# Patient Record
Sex: Female | Born: 1945 | Race: White | Marital: Married | Smoking: Never smoker
Health system: Southern US, Community
[De-identification: ages and names within clinical notes are randomized; demographics above are authoritative.]

## PROBLEM LIST (undated history)

## (undated) DIAGNOSIS — E119 Type 2 diabetes mellitus without complications: Secondary | ICD-10-CM

## (undated) DIAGNOSIS — I10 Essential (primary) hypertension: Secondary | ICD-10-CM

## (undated) DIAGNOSIS — N184 Chronic kidney disease, stage 4 (severe): Secondary | ICD-10-CM

## (undated) DIAGNOSIS — T8859XA Other complications of anesthesia, initial encounter: Secondary | ICD-10-CM

## (undated) DIAGNOSIS — Z940 Kidney transplant status: Secondary | ICD-10-CM

---

## 2019-09-16 ENCOUNTER — Other Ambulatory Visit: Payer: Self-pay | Admitting: Dentistry

## 2019-09-16 DIAGNOSIS — S0085XA Superficial foreign body of other part of head, initial encounter: Secondary | ICD-10-CM

## 2019-09-16 DIAGNOSIS — M26633 Articular disc disorder of bilateral temporomandibular joint: Secondary | ICD-10-CM

## 2019-09-18 ENCOUNTER — Ambulatory Visit
Admission: RE | Admit: 2019-09-18 | Discharge: 2019-09-18 | Disposition: A | Discharge status: Home / Self Care | Payer: Medicare Other | Source: Ambulatory Visit | Attending: Dentistry | Admitting: Dentistry

## 2019-09-18 ENCOUNTER — Other Ambulatory Visit: Payer: Self-pay

## 2019-09-18 DIAGNOSIS — M26633 Articular disc disorder of bilateral temporomandibular joint: Secondary | ICD-10-CM

## 2019-12-21 DIAGNOSIS — Z7189 Other specified counseling: Secondary | ICD-10-CM

## 2019-12-29 DIAGNOSIS — D649 Anemia, unspecified: Secondary | ICD-10-CM | POA: Diagnosis not present

## 2020-02-21 ENCOUNTER — Encounter: Payer: Self-pay | Admitting: Pharmacist

## 2020-02-21 DIAGNOSIS — D631 Anemia in chronic kidney disease: Secondary | ICD-10-CM | POA: Insufficient documentation

## 2020-02-29 ENCOUNTER — Other Ambulatory Visit: Payer: Self-pay | Admitting: Hematology and Oncology

## 2020-02-29 ENCOUNTER — Inpatient Hospital Stay: Payer: Medicare Other

## 2020-02-29 LAB — CBC AND DIFFERENTIAL
HCT: 32 — AB (ref 36–46)
Hemoglobin: 10.6 — AB (ref 12.0–16.0)
Platelets: 185 (ref 150–399)
WBC: 4

## 2020-02-29 LAB — CBC: RBC: 3.6 — AB (ref 3.87–5.11)

## 2020-03-22 ENCOUNTER — Other Ambulatory Visit: Payer: Self-pay | Admitting: Hematology and Oncology

## 2020-03-22 DIAGNOSIS — D631 Anemia in chronic kidney disease: Secondary | ICD-10-CM

## 2020-03-22 DIAGNOSIS — N189 Chronic kidney disease, unspecified: Secondary | ICD-10-CM

## 2020-03-30 ENCOUNTER — Other Ambulatory Visit: Payer: Self-pay | Admitting: Oncology

## 2020-03-30 DIAGNOSIS — D631 Anemia in chronic kidney disease: Secondary | ICD-10-CM

## 2020-03-30 NOTE — Progress Notes (Deleted)
[facility]  [address] [place],  [place]  [address] [phone]  Clinic Day:  03/30/2020  Referring physician: Algis Greenhouse, MD   HISTORY OF PRESENT ILLNESS:  The patient is a 74 y.o. female with anemia secondary to chronic renal insufficiency.  Over these past few months, she has been receiving Retacrit, which has been effective in getting her hemoglobin to/above 10.  She comes in today for routine follow-up.  Since her last visit, the patient has been doing well.  She denies having increased fatigue or any overt forms of blood loss which concern her for progressive anemia.      PHYSICAL EXAM:  There were no vitals taken for this visit. Wt Readings from Last 3 Encounters:  No data found for Wt   There is no height or weight on file to calculate BMI. Performance status (ECOG): {CHL ONC [id number] Physical Exam Constitutional:      Appearance: Normal appearance. She is not ill-appearing.  HENT:     Mouth/Throat:     Mouth: Mucous membranes are moist.     Pharynx: Oropharynx is clear. No oropharyngeal exudate or posterior oropharyngeal erythema.  Cardiovascular:     Rate and Rhythm: Normal rate and regular rhythm.     Heart sounds: No murmur heard.  No friction rub. No gallop.   Pulmonary:     Effort: Pulmonary effort is normal. No respiratory distress.     Breath sounds: Normal breath sounds. No wheezing, rhonchi or rales.  Abdominal:     General: Bowel sounds are normal. There is no distension.     Palpations: Abdomen is soft. There is no mass.     Tenderness: There is no abdominal tenderness.  Musculoskeletal:        General: No swelling.     Right lower leg: No edema.     Left lower leg: No edema.  Lymphadenopathy:     Cervical: No cervical adenopathy.     Upper Body:     Right upper body: No supraclavicular or axillary adenopathy.     Left upper body: No supraclavicular or axillary adenopathy.     Lower Body: No right  inguinal adenopathy. No left inguinal adenopathy.  Skin:    General: Skin is warm.     Coloration: Skin is not jaundiced.     Findings: No lesion or rash.  Neurological:     General: No focal deficit present.     Mental Status: She is alert and oriented to person, place, and time. Mental status is at baseline.     Cranial Nerves: Cranial nerves are intact.  Psychiatric:        Mood and Affect: Mood normal.        Behavior: Behavior normal.        Thought Content: Thought content normal.     LABS:       CBC Latest Ref Rng & Units 02/29/2020  WBC - 4.0  Hemoglobin 12.0 - 16.0 10.6(A)  Hematocrit 36 - 46 32(A)  Platelets 150 - 399 185   No flowsheet data found.   No results found for: CEA1 / No results found for: CEA1 No results found for: PSA1 No results found for: GQQ761 No results found for: CAN125  No results found for: TOTALPROTELP, ALBUMINELP, A1GS, A2GS, BETS, BETA2SER, GAMS, MSPIKE, SPEI No results found for: TIBC, FERRITIN, IRONPCTSAT No results found for: LDH  No results found for: AFPTUMOR, TOTALPROTELP, ALBUMINELP, A1GS, A2GS, BETS, BETA2SER, GAMS,  MSPIKE, SPEI, LDH, CEA1, PSA1, IGASERUM, IGGSERUM, IGMSERUM, THGAB, THYROGLB   STUDIES:  No results found.    ASSESSMENT & PLAN:   Assessment/Plan:  A 74 y.o. female with anemia secondary to chronic renal insufficiency.    The patient understands all the plans discussed today and is in agreement with them.      Ajmal Kathan Macarthur Critchley, MD

## 2020-03-31 ENCOUNTER — Other Ambulatory Visit: Payer: Medicare Other

## 2020-03-31 ENCOUNTER — Ambulatory Visit: Payer: Medicare Other | Admitting: Oncology

## 2020-05-24 ENCOUNTER — Other Ambulatory Visit: Payer: Self-pay | Admitting: Hematology and Oncology

## 2020-05-24 DIAGNOSIS — N189 Chronic kidney disease, unspecified: Secondary | ICD-10-CM

## 2020-05-24 DIAGNOSIS — D631 Anemia in chronic kidney disease: Secondary | ICD-10-CM

## 2020-05-25 ENCOUNTER — Encounter: Payer: Self-pay | Admitting: Hematology and Oncology

## 2020-05-25 ENCOUNTER — Other Ambulatory Visit: Payer: Self-pay

## 2020-05-25 ENCOUNTER — Telehealth: Payer: Self-pay | Admitting: Hematology and Oncology

## 2020-05-25 ENCOUNTER — Inpatient Hospital Stay: Payer: Medicare Other | Admitting: Hematology and Oncology

## 2020-05-25 ENCOUNTER — Inpatient Hospital Stay: Payer: Medicare Other | Attending: Hematology and Oncology

## 2020-05-25 VITALS — BP 123/84 | HR 67 | Temp 98.2°F | Resp 16 | Ht 61.0 in | Wt 125.5 lb

## 2020-05-25 DIAGNOSIS — D631 Anemia in chronic kidney disease: Secondary | ICD-10-CM | POA: Diagnosis not present

## 2020-05-25 DIAGNOSIS — N289 Disorder of kidney and ureter, unspecified: Secondary | ICD-10-CM | POA: Insufficient documentation

## 2020-05-25 DIAGNOSIS — N189 Chronic kidney disease, unspecified: Secondary | ICD-10-CM | POA: Diagnosis not present

## 2020-05-25 LAB — CBC AND DIFFERENTIAL
HCT: 29 — AB (ref 36–46)
Hemoglobin: 9.6 — AB (ref 12.0–16.0)
Neutrophils Absolute: 2.16
Platelets: 166 (ref 150–399)
WBC: 4

## 2020-05-25 LAB — BASIC METABOLIC PANEL
BUN: 54 — AB (ref 4–21)
CO2: 21 (ref 13–22)
Chloride: 110 — AB (ref 99–108)
Creatinine: 3.3 — AB (ref 0.5–1.1)
Glucose: 149
Potassium: 4.6 (ref 3.4–5.3)
Sodium: 141 (ref 137–147)

## 2020-05-25 LAB — HEPATIC FUNCTION PANEL
ALT: 19 (ref 7–35)
AST: 27 (ref 13–35)
Alkaline Phosphatase: 132 — AB (ref 25–125)
Bilirubin, Total: 0.5

## 2020-05-25 LAB — CBC: RBC: 3.09 — AB (ref 3.87–5.11)

## 2020-05-25 LAB — COMPREHENSIVE METABOLIC PANEL
Albumin: 4.1 (ref 3.5–5.0)
Calcium: 9.2 (ref 8.7–10.7)

## 2020-05-25 NOTE — Addendum Note (Signed)
Addended by: Juanetta Beets on: 05/25/2020 01:42 PM   Modules accepted: Orders

## 2020-05-25 NOTE — Progress Notes (Signed)
[name]  [address] [place],  [place]  [address] [phone]  Clinic Day:  06/30/2020  Referring physician: Algis Greenhouse, MD   CHIEF COMPLAINT:  CC: A 75 year old female with a history of anemia who is here for 3 month evaluation  Current Treatment:  Retacrit   HISTORY OF PRESENT ILLNESS:  Ruth Riley is a 75 y.o. female with a history of anemia related to renal insufficiency. 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 She has been receiving monthly Retacrit injections for hemoglobin below 10.   INTERVAL HISTORY:  Analy is here today for routine follow up of her anemia. She has been doing well since last visit. She denies fever, chills, nausea or vomiting. She denies issue with bowel or bladder. She denies shortness of breath, chest pain or cough. Her appetite is good and her weight is stable. CBC is unremarkable today with a hemoglobin of 10.5. CMP reveals a creatinine of 3.2 with BUN 57. This is stable for the patient.  REVIEW OF SYSTEMS:  Review of Systems  Constitutional: Negative for appetite change, chills, diaphoresis, fatigue, fever and unexpected weight change.  HENT:   Negative for hearing loss, lump/mass, mouth sores, nosebleeds, sore throat, tinnitus, trouble swallowing and voice change.   Eyes: Negative for eye problems and icterus.  Respiratory: Negative for chest tightness, cough, hemoptysis, shortness of breath and wheezing.   Cardiovascular: Negative for chest pain, leg swelling and palpitations.  Gastrointestinal: Negative for abdominal distention, abdominal pain, blood in stool, constipation, diarrhea, nausea, rectal pain and vomiting.  Endocrine: Negative for hot flashes.  Genitourinary: Negative for bladder incontinence, difficulty urinating, dyspareunia, dysuria, frequency, hematuria and nocturia.    Musculoskeletal: Negative for arthralgias, back pain, flank pain, gait problem, myalgias, neck pain and neck stiffness.  Skin: Negative for itching, rash and wound.  Neurological: Negative for dizziness, extremity weakness, gait problem, headaches, light-headedness, numbness, seizures and speech difficulty.  Hematological: Negative for adenopathy. Does not bruise/bleed easily.  Psychiatric/Behavioral: Negative for confusion, decreased concentration, depression, sleep disturbance and suicidal ideas. The patient is not nervous/anxious.      VITALS:  Blood pressure 123/84, pulse 67, temperature 98.2 F (36.8 C), temperature source Oral, resp. rate 16, height 5\' 1"  (1.549 m), weight 125 lb 8 oz (56.9 kg), SpO2 96 %.  Wt Readings from Last 3 Encounters:  05/26/20 125 lb 4 oz (56.8 kg)  05/25/20 125 lb 8 oz (56.9 kg)    Body mass index is 23.71 kg/m.  Performance status (ECOG): 1 - Symptomatic but completely ambulatory  PHYSICAL EXAM:  Physical Exam Constitutional:      General: She is not in acute distress.    Appearance: Normal appearance. She is normal weight. She is not ill-appearing, toxic-appearing or diaphoretic.  HENT:     Head: Normocephalic and atraumatic.     Nose: Nose normal. No congestion or rhinorrhea.     Mouth/Throat:     Mouth: Mucous membranes are moist.     Pharynx: Oropharynx is clear. No oropharyngeal exudate or posterior oropharyngeal erythema.  Eyes:     General: No scleral icterus.       Right eye: No discharge.        Left eye: No discharge.     Extraocular Movements: Extraocular movements intact.     Conjunctiva/sclera: Conjunctivae normal.     Pupils: Pupils are  equal, round, and reactive to light.  Neck:     Vascular: No carotid bruit.  Cardiovascular:     Rate and Rhythm: Normal rate and regular rhythm.     Heart sounds: No murmur heard. No friction rub. No gallop.   Pulmonary:     Effort: Pulmonary effort is normal. No respiratory distress.      Breath sounds: Normal breath sounds. No stridor. No wheezing, rhonchi or rales.  Chest:     Chest wall: No tenderness.  Abdominal:     General: Abdomen is flat. Bowel sounds are normal. There is no distension.     Palpations: There is no mass.     Tenderness: There is no abdominal tenderness. There is no right CVA tenderness, left CVA tenderness, guarding or rebound.     Hernia: No hernia is present.  Musculoskeletal:        General: No swelling, tenderness, deformity or signs of injury. Normal range of motion.     Cervical back: Normal range of motion and neck supple. No rigidity or tenderness.     Right lower leg: No edema.     Left lower leg: No edema.  Lymphadenopathy:     Cervical: No cervical adenopathy.  Skin:    General: Skin is warm and dry.     Capillary Refill: Capillary refill takes less than 2 seconds.     Coloration: Skin is not jaundiced or pale.     Findings: No bruising, erythema, lesion or rash.  Neurological:     General: No focal deficit present.     Mental Status: She is alert and oriented to person, place, and time. Mental status is at baseline.     Cranial Nerves: No cranial nerve deficit.     Sensory: No sensory deficit.     Motor: No weakness.     Coordination: Coordination normal.     Gait: Gait normal.     Deep Tendon Reflexes: Reflexes normal.  Psychiatric:        Mood and Affect: Mood normal.        Behavior: Behavior normal.        Thought Content: Thought content normal.        Judgment: Judgment normal.    Lymph nodes:   There is no cervical, clavicular, axillary or inguinal lymphadenopathy.   LABS:   CBC Latest Ref Rng & Units 06/22/2020 05/25/2020 02/29/2020  WBC - 3.3 4.0 4.0  Hemoglobin 12.0 - 16.0 10.5(A) 9.6(A) 10.6(A)  Hematocrit 36 - 46 31(A) 29(A) 32(A)  Platelets 150 - 399 129(A) 166 185   CMP Latest Ref Rng & Units 06/22/2020 05/25/2020  BUN 4 - 21 57(A) 54(A)  Creatinine 0.5 - 1.1 3.2(A) 3.3(A)  Sodium 137 - 147 133(A) 141   Potassium 3.4 - 5.3 4.0 4.6  Chloride 99 - 108 103 110(A)  CO2 13 - 22 20 21   Calcium 8.7 - 10.7 8.1(A) 9.2  Alkaline Phos 25 - 125 112 132(A)  AST 13 - 35 28 27  ALT 7 - 35 25 19     No results found for: CEA1 / No results found for: CEA1 No results found for: PSA1 No results found for: NUU725 No results found for: CAN125  No results found for: TOTALPROTELP, ALBUMINELP, A1GS, A2GS, BETS, BETA2SER, GAMS, MSPIKE, SPEI Lab Results  Component Value Date   TIBC 254 06/22/2020   FERRITIN 122 06/22/2020   IRONPCTSAT 6.2 06/22/2020   No results found for: LDH  STUDIES:  No results found.    HISTORY:  No past medical history on file.    No family history on file.  Social History:  reports that she has never smoked. She has never used smokeless tobacco. No history on file for alcohol use and drug use.The patient is alone  today.  Allergies:  Allergies  Allergen Reactions  . Penicillins Hives and Other (See Comments)  . Tramadol Other (See Comments) and Palpitations    Patient reports it makes her feel spaced out Patient reports it makes her feel spaced out Patient reports it makes her feel spaced out Patient reports it makes her feel spaced out   . Donepezil Diarrhea and Other (See Comments)    Current Medications: Current Outpatient Medications  Medication Sig Dispense Refill  . rosuvastatin (CRESTOR) 40 MG tablet Take by mouth.    . sertraline (ZOLOFT) 100 MG tablet Take by mouth.    . sodium bicarbonate 650 MG tablet Take 2 tablets by mouth 2 (two) times daily.    . traZODone (DESYREL) 50 MG tablet Take 50 mg by mouth at bedtime.    . carvedilol (COREG) 25 MG tablet Take 25 mg by mouth 2 (two) times daily.    . cyanocobalamin 1000 MCG tablet Take by mouth.    . Magnesium-Zinc (MAGNESIUM-CHELATED ZINC) 133.33-5 MG TABS Take by mouth.    . pantoprazole (PROTONIX) 40 MG tablet Take 40 mg by mouth 2 (two) times daily.    . predniSONE (DELTASONE) 5 MG tablet     .  rOPINIRole (REQUIP) 3 MG tablet Take 3 mg by mouth at bedtime.    . sulfamethoxazole-trimethoprim (BACTRIM) 400-80 MG tablet Take by mouth.    . tacrolimus (PROGRAF) 1 MG capsule Take by mouth.    . Vitamin D, Ergocalciferol, (DRISDOL) 1.25 MG (50000 UNIT) CAPS capsule Take 50,000 Units by mouth once a week.     No current facility-administered medications for this visit.     ASSESSMENT & PLAN:   Assessment:  Ruth Riley is a 75 y.o. female with anemia related to significant renal insufficiency. Hemoglobin today remains above 10.0.     Plan: We will hold Retacrit today. She will continue to check CBC monthly and will return to clinic in 3 months for CBC, CMP and evaluation. She will continue to follow with her nephrology team in regards to her renal insufficiency.  She verbalizes understanding of and agreement to the plans discussed today. She knows to call the office should any new questions or concerns arise.    Melodye Ped, NP

## 2020-05-25 NOTE — Telephone Encounter (Signed)
Per 1/12 los next appt given to patient 

## 2020-05-26 ENCOUNTER — Inpatient Hospital Stay: Payer: Medicare Other

## 2020-05-26 VITALS — BP 133/95 | HR 63 | Temp 98.5°F | Resp 18 | Ht 61.0 in | Wt 125.2 lb

## 2020-05-26 DIAGNOSIS — D631 Anemia in chronic kidney disease: Secondary | ICD-10-CM | POA: Diagnosis present

## 2020-05-26 DIAGNOSIS — N289 Disorder of kidney and ureter, unspecified: Secondary | ICD-10-CM | POA: Diagnosis not present

## 2020-05-26 DIAGNOSIS — N185 Chronic kidney disease, stage 5: Secondary | ICD-10-CM

## 2020-05-26 MED ORDER — EPOETIN ALFA-EPBX 40000 UNIT/ML IJ SOLN
40000.0000 [IU] | Freq: Once | INTRAMUSCULAR | Status: AC
  Administered 2020-05-26: 40000 [IU] via SUBCUTANEOUS

## 2020-05-26 MED ORDER — EPOETIN ALFA-EPBX 40000 UNIT/ML IJ SOLN
INTRAMUSCULAR | Status: AC
  Filled 2020-05-26: qty 1

## 2020-05-26 NOTE — Progress Notes (Signed)
1410:PT STABLE AT TIME OF DISCHARGE ?

## 2020-06-18 DIAGNOSIS — Z8616 Personal history of COVID-19: Secondary | ICD-10-CM | POA: Diagnosis present

## 2020-06-22 ENCOUNTER — Other Ambulatory Visit: Payer: Self-pay | Admitting: Hematology and Oncology

## 2020-06-22 ENCOUNTER — Inpatient Hospital Stay: Payer: Medicare Other | Attending: Oncology

## 2020-06-22 ENCOUNTER — Other Ambulatory Visit: Payer: Self-pay

## 2020-06-22 DIAGNOSIS — D631 Anemia in chronic kidney disease: Secondary | ICD-10-CM

## 2020-06-22 LAB — IRON,TIBC AND FERRITIN PANEL
%SAT: 6.2
Ferritin: 122
Iron: 16
TIBC: 254

## 2020-06-22 LAB — HEPATIC FUNCTION PANEL
ALT: 25 (ref 7–35)
AST: 28 (ref 13–35)
Alkaline Phosphatase: 112 (ref 25–125)
Bilirubin, Total: 0.6

## 2020-06-22 LAB — CBC AND DIFFERENTIAL
HCT: 31 — AB (ref 36–46)
Hemoglobin: 10.5 — AB (ref 12.0–16.0)
Neutrophils Absolute: 1.78
Platelets: 129 — AB (ref 150–399)
WBC: 3.3

## 2020-06-22 LAB — BASIC METABOLIC PANEL
BUN: 57 — AB (ref 4–21)
CO2: 20 (ref 13–22)
Chloride: 103 (ref 99–108)
Creatinine: 3.2 — AB (ref 0.5–1.1)
Glucose: 223
Potassium: 4 (ref 3.4–5.3)
Sodium: 133 — AB (ref 137–147)

## 2020-06-22 LAB — CBC: RBC: 3.46 — AB (ref 3.87–5.11)

## 2020-06-22 LAB — COMPREHENSIVE METABOLIC PANEL
Albumin: 3.7 (ref 3.5–5.0)
Calcium: 8.1 — AB (ref 8.7–10.7)

## 2020-06-29 ENCOUNTER — Other Ambulatory Visit: Payer: Self-pay | Admitting: Hematology and Oncology

## 2020-06-29 LAB — CBC: MCV: 90 (ref 81–99)

## 2020-07-20 ENCOUNTER — Inpatient Hospital Stay: Payer: Medicare Other | Attending: Oncology

## 2020-07-20 ENCOUNTER — Other Ambulatory Visit: Payer: Self-pay | Admitting: Hematology and Oncology

## 2020-07-20 ENCOUNTER — Other Ambulatory Visit: Payer: Self-pay

## 2020-07-20 DIAGNOSIS — D631 Anemia in chronic kidney disease: Secondary | ICD-10-CM

## 2020-07-20 DIAGNOSIS — N189 Chronic kidney disease, unspecified: Secondary | ICD-10-CM | POA: Insufficient documentation

## 2020-07-20 LAB — HEPATIC FUNCTION PANEL
ALT: 28 (ref 7–35)
AST: 29 (ref 13–35)
Alkaline Phosphatase: 131 — AB (ref 25–125)
Bilirubin, Total: 0.5

## 2020-07-20 LAB — BASIC METABOLIC PANEL
BUN: 41 — AB (ref 4–21)
CO2: 19 (ref 13–22)
Chloride: 110 — AB (ref 99–108)
Creatinine: 2.8 — AB (ref 0.5–1.1)
Glucose: 163
Potassium: 4.8 (ref 3.4–5.3)
Sodium: 138 (ref 137–147)

## 2020-07-20 LAB — COMPREHENSIVE METABOLIC PANEL
Albumin: 3.7 (ref 3.5–5.0)
Calcium: 8.8 (ref 8.7–10.7)

## 2020-07-20 LAB — CBC AND DIFFERENTIAL
HCT: 29 — AB (ref 36–46)
Hemoglobin: 9.5 — AB (ref 12.0–16.0)
Neutrophils Absolute: 2.51
Platelets: 170 (ref 150–399)
WBC: 4.4

## 2020-07-20 LAB — CBC: RBC: 3.16 — AB (ref 3.87–5.11)

## 2020-07-25 ENCOUNTER — Telehealth: Payer: Self-pay

## 2020-07-25 NOTE — Telephone Encounter (Addendum)
Pt's Hgb<10, Melissa states pt needs Retacrit injection. I sent message to Butch Penny to check for insurance authorization. I also sent scheduling message to schedulers to call pt, once approved for appt.  I attempted call to pt, no answer.   In basket message sent to [facility] P,NP.

## 2020-07-29 ENCOUNTER — Other Ambulatory Visit: Payer: Self-pay | Admitting: Oncology

## 2020-08-04 ENCOUNTER — Telehealth: Payer: Self-pay | Admitting: Oncology

## 2020-08-04 NOTE — Telephone Encounter (Signed)
08/04/20 Spoke with patient and scheduled injection

## 2020-08-08 ENCOUNTER — Inpatient Hospital Stay: Payer: Medicare Other

## 2020-08-08 ENCOUNTER — Other Ambulatory Visit: Payer: Self-pay

## 2020-08-08 VITALS — BP 127/75 | HR 74 | Temp 98.9°F | Resp 18 | Ht 61.0 in | Wt 125.1 lb

## 2020-08-08 DIAGNOSIS — N189 Chronic kidney disease, unspecified: Secondary | ICD-10-CM | POA: Diagnosis not present

## 2020-08-08 DIAGNOSIS — D631 Anemia in chronic kidney disease: Secondary | ICD-10-CM | POA: Diagnosis present

## 2020-08-08 DIAGNOSIS — N185 Chronic kidney disease, stage 5: Secondary | ICD-10-CM

## 2020-08-08 DIAGNOSIS — N289 Disorder of kidney and ureter, unspecified: Secondary | ICD-10-CM | POA: Diagnosis present

## 2020-08-08 MED ORDER — EPOETIN ALFA-EPBX 40000 UNIT/ML IJ SOLN
40000.0000 [IU] | Freq: Once | INTRAMUSCULAR | Status: AC
  Administered 2020-08-08: 40000 [IU] via SUBCUTANEOUS

## 2020-08-08 MED ORDER — EPOETIN ALFA-EPBX 40000 UNIT/ML IJ SOLN
INTRAMUSCULAR | Status: AC
  Filled 2020-08-08: qty 1

## 2020-08-08 NOTE — Patient Instructions (Signed)

## 2020-08-08 NOTE — Progress Notes (Signed)
Pt d/c stable at 1320

## 2020-08-10 ENCOUNTER — Telehealth: Payer: Self-pay | Admitting: Oncology

## 2020-08-10 NOTE — Telephone Encounter (Signed)
Scheduled per 3/15 sch msg. Pt will receive an updated appt calendar per next visit appt notes

## 2020-08-16 ENCOUNTER — Other Ambulatory Visit: Payer: Self-pay

## 2020-08-16 ENCOUNTER — Inpatient Hospital Stay (HOSPITAL_COMMUNITY)
Admission: AD | Admit: 2020-08-16 | Discharge: 2020-09-01 | DRG: 288 | Disposition: A | Discharge status: Skilled Nursing Facility | Payer: Medicare Other | Source: Other Acute Inpatient Hospital | Attending: Internal Medicine | Admitting: Internal Medicine

## 2020-08-16 ENCOUNTER — Encounter (HOSPITAL_COMMUNITY): Payer: Self-pay | Admitting: Internal Medicine

## 2020-08-16 DIAGNOSIS — W19XXXA Unspecified fall, initial encounter: Secondary | ICD-10-CM | POA: Diagnosis not present

## 2020-08-16 DIAGNOSIS — B49 Unspecified mycosis: Secondary | ICD-10-CM | POA: Diagnosis present

## 2020-08-16 DIAGNOSIS — M549 Dorsalgia, unspecified: Secondary | ICD-10-CM | POA: Diagnosis present

## 2020-08-16 DIAGNOSIS — I120 Hypertensive chronic kidney disease with stage 5 chronic kidney disease or end stage renal disease: Secondary | ICD-10-CM | POA: Diagnosis not present

## 2020-08-16 DIAGNOSIS — D649 Anemia, unspecified: Secondary | ICD-10-CM

## 2020-08-16 DIAGNOSIS — Z7952 Long term (current) use of systemic steroids: Secondary | ICD-10-CM

## 2020-08-16 DIAGNOSIS — K219 Gastro-esophageal reflux disease without esophagitis: Secondary | ICD-10-CM | POA: Diagnosis not present

## 2020-08-16 DIAGNOSIS — D631 Anemia in chronic kidney disease: Secondary | ICD-10-CM | POA: Diagnosis not present

## 2020-08-16 DIAGNOSIS — K648 Other hemorrhoids: Secondary | ICD-10-CM | POA: Diagnosis present

## 2020-08-16 DIAGNOSIS — J188 Other pneumonia, unspecified organism: Secondary | ICD-10-CM | POA: Diagnosis present

## 2020-08-16 DIAGNOSIS — K921 Melena: Secondary | ICD-10-CM

## 2020-08-16 DIAGNOSIS — D696 Thrombocytopenia, unspecified: Secondary | ICD-10-CM | POA: Diagnosis present

## 2020-08-16 DIAGNOSIS — E11319 Type 2 diabetes mellitus with unspecified diabetic retinopathy without macular edema: Secondary | ICD-10-CM | POA: Diagnosis present

## 2020-08-16 DIAGNOSIS — K5730 Diverticulosis of large intestine without perforation or abscess without bleeding: Secondary | ICD-10-CM | POA: Diagnosis present

## 2020-08-16 DIAGNOSIS — Z66 Do not resuscitate: Secondary | ICD-10-CM | POA: Diagnosis present

## 2020-08-16 DIAGNOSIS — K449 Diaphragmatic hernia without obstruction or gangrene: Secondary | ICD-10-CM | POA: Diagnosis present

## 2020-08-16 DIAGNOSIS — E1122 Type 2 diabetes mellitus with diabetic chronic kidney disease: Secondary | ICD-10-CM | POA: Diagnosis not present

## 2020-08-16 DIAGNOSIS — Z79891 Long term (current) use of opiate analgesic: Secondary | ICD-10-CM

## 2020-08-16 DIAGNOSIS — J189 Pneumonia, unspecified organism: Secondary | ICD-10-CM | POA: Diagnosis present

## 2020-08-16 DIAGNOSIS — K31811 Angiodysplasia of stomach and duodenum with bleeding: Secondary | ICD-10-CM

## 2020-08-16 DIAGNOSIS — K2981 Duodenitis with bleeding: Secondary | ICD-10-CM

## 2020-08-16 DIAGNOSIS — D62 Acute posthemorrhagic anemia: Secondary | ICD-10-CM | POA: Diagnosis present

## 2020-08-16 DIAGNOSIS — E785 Hyperlipidemia, unspecified: Secondary | ICD-10-CM | POA: Diagnosis not present

## 2020-08-16 DIAGNOSIS — R040 Epistaxis: Secondary | ICD-10-CM | POA: Diagnosis not present

## 2020-08-16 DIAGNOSIS — Z20822 Contact with and (suspected) exposure to covid-19: Secondary | ICD-10-CM | POA: Diagnosis not present

## 2020-08-16 DIAGNOSIS — Z885 Allergy status to narcotic agent status: Secondary | ICD-10-CM

## 2020-08-16 DIAGNOSIS — D849 Immunodeficiency, unspecified: Secondary | ICD-10-CM | POA: Diagnosis present

## 2020-08-16 DIAGNOSIS — Z940 Kidney transplant status: Secondary | ICD-10-CM

## 2020-08-16 DIAGNOSIS — Z79899 Other long term (current) drug therapy: Secondary | ICD-10-CM

## 2020-08-16 DIAGNOSIS — I10 Essential (primary) hypertension: Secondary | ICD-10-CM | POA: Diagnosis present

## 2020-08-16 DIAGNOSIS — I513 Intracardiac thrombosis, not elsewhere classified: Secondary | ICD-10-CM

## 2020-08-16 DIAGNOSIS — G4733 Obstructive sleep apnea (adult) (pediatric): Secondary | ICD-10-CM | POA: Diagnosis not present

## 2020-08-16 DIAGNOSIS — Z96653 Presence of artificial knee joint, bilateral: Secondary | ICD-10-CM | POA: Diagnosis present

## 2020-08-16 DIAGNOSIS — I429 Cardiomyopathy, unspecified: Secondary | ICD-10-CM | POA: Diagnosis not present

## 2020-08-16 DIAGNOSIS — D509 Iron deficiency anemia, unspecified: Secondary | ICD-10-CM

## 2020-08-16 DIAGNOSIS — N185 Chronic kidney disease, stage 5: Secondary | ICD-10-CM | POA: Diagnosis not present

## 2020-08-16 DIAGNOSIS — R4189 Other symptoms and signs involving cognitive functions and awareness: Secondary | ICD-10-CM | POA: Diagnosis present

## 2020-08-16 DIAGNOSIS — G9341 Metabolic encephalopathy: Secondary | ICD-10-CM | POA: Diagnosis present

## 2020-08-16 DIAGNOSIS — T8619 Other complication of kidney transplant: Secondary | ICD-10-CM | POA: Diagnosis present

## 2020-08-16 DIAGNOSIS — Z8616 Personal history of COVID-19: Secondary | ICD-10-CM | POA: Diagnosis present

## 2020-08-16 DIAGNOSIS — E119 Type 2 diabetes mellitus without complications: Secondary | ICD-10-CM

## 2020-08-16 DIAGNOSIS — G5600 Carpal tunnel syndrome, unspecified upper limb: Secondary | ICD-10-CM | POA: Diagnosis present

## 2020-08-16 DIAGNOSIS — K5900 Constipation, unspecified: Secondary | ICD-10-CM | POA: Diagnosis present

## 2020-08-16 DIAGNOSIS — E875 Hyperkalemia: Secondary | ICD-10-CM | POA: Diagnosis present

## 2020-08-16 DIAGNOSIS — Y830 Surgical operation with transplant of whole organ as the cause of abnormal reaction of the patient, or of later complication, without mention of misadventure at the time of the procedure: Secondary | ICD-10-CM | POA: Diagnosis not present

## 2020-08-16 DIAGNOSIS — M25532 Pain in left wrist: Secondary | ICD-10-CM | POA: Diagnosis present

## 2020-08-16 DIAGNOSIS — K644 Residual hemorrhoidal skin tags: Secondary | ICD-10-CM | POA: Diagnosis present

## 2020-08-16 DIAGNOSIS — I330 Acute and subacute infective endocarditis: Principal | ICD-10-CM

## 2020-08-16 DIAGNOSIS — R0600 Dyspnea, unspecified: Secondary | ICD-10-CM

## 2020-08-16 DIAGNOSIS — S62605A Fracture of unspecified phalanx of left ring finger, initial encounter for closed fracture: Secondary | ICD-10-CM | POA: Diagnosis present

## 2020-08-16 DIAGNOSIS — S6292XA Unspecified fracture of left wrist and hand, initial encounter for closed fracture: Secondary | ICD-10-CM

## 2020-08-16 DIAGNOSIS — M069 Rheumatoid arthritis, unspecified: Secondary | ICD-10-CM | POA: Diagnosis present

## 2020-08-16 DIAGNOSIS — G8929 Other chronic pain: Secondary | ICD-10-CM | POA: Diagnosis present

## 2020-08-16 DIAGNOSIS — R296 Repeated falls: Secondary | ICD-10-CM | POA: Diagnosis present

## 2020-08-16 DIAGNOSIS — R195 Other fecal abnormalities: Secondary | ICD-10-CM | POA: Diagnosis not present

## 2020-08-16 DIAGNOSIS — M25531 Pain in right wrist: Secondary | ICD-10-CM | POA: Diagnosis present

## 2020-08-16 DIAGNOSIS — N179 Acute kidney failure, unspecified: Secondary | ICD-10-CM | POA: Diagnosis not present

## 2020-08-16 DIAGNOSIS — N184 Chronic kidney disease, stage 4 (severe): Secondary | ICD-10-CM | POA: Diagnosis present

## 2020-08-16 DIAGNOSIS — Z880 Allergy status to penicillin: Secondary | ICD-10-CM

## 2020-08-16 DIAGNOSIS — K529 Noninfective gastroenteritis and colitis, unspecified: Secondary | ICD-10-CM

## 2020-08-16 DIAGNOSIS — Z7189 Other specified counseling: Secondary | ICD-10-CM

## 2020-08-16 DIAGNOSIS — Z888 Allergy status to other drugs, medicaments and biological substances status: Secondary | ICD-10-CM

## 2020-08-16 HISTORY — DX: Other complications of anesthesia, initial encounter: T88.59XA

## 2020-08-16 HISTORY — DX: Chronic kidney disease, stage 4 (severe): N18.4

## 2020-08-16 HISTORY — DX: Kidney transplant status: Z94.0

## 2020-08-16 HISTORY — DX: Type 2 diabetes mellitus without complications: E11.9

## 2020-08-16 HISTORY — DX: Essential (primary) hypertension: I10

## 2020-08-16 LAB — CBC WITH DIFFERENTIAL/PLATELET
Abs Immature Granulocytes: 0 10*3/uL (ref 0.00–0.07)
Basophils Absolute: 0 10*3/uL (ref 0.0–0.1)
Basophils Relative: 0 %
Eosinophils Absolute: 0.1 10*3/uL (ref 0.0–0.5)
Eosinophils Relative: 1 %
HCT: 26.5 % — ABNORMAL LOW (ref 36.0–46.0)
Hemoglobin: 8.5 g/dL — ABNORMAL LOW (ref 12.0–15.0)
Lymphocytes Relative: 10 %
Lymphs Abs: 0.5 10*3/uL — ABNORMAL LOW (ref 0.7–4.0)
MCH: 29.9 pg (ref 26.0–34.0)
MCHC: 32.1 g/dL (ref 30.0–36.0)
MCV: 93.3 fL (ref 80.0–100.0)
Monocytes Absolute: 0.2 10*3/uL (ref 0.1–1.0)
Monocytes Relative: 4 %
Neutro Abs: 4.6 10*3/uL (ref 1.7–7.7)
Neutrophils Relative %: 85 %
Platelets: 134 10*3/uL — ABNORMAL LOW (ref 150–400)
RBC: 2.84 MIL/uL — ABNORMAL LOW (ref 3.87–5.11)
RDW: 16.5 % — ABNORMAL HIGH (ref 11.5–15.5)
WBC: 5.4 10*3/uL (ref 4.0–10.5)
nRBC: 0 % (ref 0.0–0.2)
nRBC: 1 /100 WBC — ABNORMAL HIGH

## 2020-08-16 LAB — COMPREHENSIVE METABOLIC PANEL
ALT: 16 U/L (ref 0–44)
AST: 15 U/L (ref 15–41)
Albumin: 2.6 g/dL — ABNORMAL LOW (ref 3.5–5.0)
Alkaline Phosphatase: 70 U/L (ref 38–126)
Anion gap: 9 (ref 5–15)
BUN: 56 mg/dL — ABNORMAL HIGH (ref 8–23)
CO2: 27 mmol/L (ref 22–32)
Calcium: 8.9 mg/dL (ref 8.9–10.3)
Chloride: 99 mmol/L (ref 98–111)
Creatinine, Ser: 3.77 mg/dL — ABNORMAL HIGH (ref 0.44–1.00)
GFR, Estimated: 12 mL/min — ABNORMAL LOW (ref >60)
Glucose, Bld: 104 mg/dL — ABNORMAL HIGH (ref 70–99)
Potassium: 4.8 mmol/L (ref 3.5–5.1)
Sodium: 135 mmol/L (ref 135–145)
Total Bilirubin: 1 mg/dL (ref 0.3–1.2)
Total Protein: 6 g/dL — ABNORMAL LOW (ref 6.5–8.1)

## 2020-08-16 LAB — GLUCOSE, CAPILLARY
Glucose-Capillary: 127 mg/dL — ABNORMAL HIGH (ref 70–99)
Glucose-Capillary: 264 mg/dL — ABNORMAL HIGH (ref 70–99)

## 2020-08-16 LAB — TSH: TSH: 2.903 u[IU]/mL (ref 0.350–4.500)

## 2020-08-16 LAB — PROCALCITONIN: Procalcitonin: 1.1 ng/mL

## 2020-08-16 MED ORDER — SODIUM CHLORIDE 0.9 % IV SOLN: INTRAVENOUS | Status: DC

## 2020-08-16 MED ORDER — TACROLIMUS 1 MG PO CAPS
2.0000 mg | ORAL_CAPSULE | Freq: Two times a day (BID) | ORAL | Status: DC
  Administered 2020-08-16 – 2020-08-17 (×3): 2 mg via ORAL
  Filled 2020-08-16 (×3): qty 2

## 2020-08-16 MED ORDER — CAMPHOR-MENTHOL 0.5-0.5 % EX LOTN
1.0000 "application " | TOPICAL_LOTION | Freq: Three times a day (TID) | CUTANEOUS | Status: DC | PRN
  Filled 2020-08-16: qty 222

## 2020-08-16 MED ORDER — SODIUM BICARBONATE 650 MG PO TABS
1300.0000 mg | ORAL_TABLET | Freq: Two times a day (BID) | ORAL | Status: DC
  Administered 2020-08-16 – 2020-09-01 (×33): 1300 mg via ORAL
  Filled 2020-08-16 (×33): qty 2

## 2020-08-16 MED ORDER — INSULIN GLARGINE 100 UNIT/ML SOLOSTAR PEN: 10.0000 [IU] | PEN_INJECTOR | Freq: Every day | SUBCUTANEOUS | Status: DC

## 2020-08-16 MED ORDER — CARVEDILOL 25 MG PO TABS
25.0000 mg | ORAL_TABLET | Freq: Two times a day (BID) | ORAL | Status: DC
  Administered 2020-08-16 – 2020-09-01 (×33): 25 mg via ORAL
  Filled 2020-08-16 (×24): qty 1
  Filled 2020-08-16: qty 4
  Filled 2020-08-16 (×2): qty 1
  Filled 2020-08-16: qty 4
  Filled 2020-08-16 (×5): qty 1

## 2020-08-16 MED ORDER — NEPRO/CARBSTEADY PO LIQD: 237.0000 mL | Freq: Three times a day (TID) | ORAL | Status: DC | PRN

## 2020-08-16 MED ORDER — INSULIN ASPART 100 UNIT/ML ~~LOC~~ SOLN
0.0000 [IU] | Freq: Three times a day (TID) | SUBCUTANEOUS | Status: DC
  Administered 2020-08-17: 7 [IU] via SUBCUTANEOUS
  Administered 2020-08-17: 5 [IU] via SUBCUTANEOUS
  Administered 2020-08-17 – 2020-08-18 (×3): 1 [IU] via SUBCUTANEOUS
  Administered 2020-08-18: 2 [IU] via SUBCUTANEOUS
  Administered 2020-08-19: 1 [IU] via SUBCUTANEOUS
  Administered 2020-08-19: 2 [IU] via SUBCUTANEOUS
  Administered 2020-08-20: 1 [IU] via SUBCUTANEOUS
  Administered 2020-08-20 – 2020-08-21 (×3): 2 [IU] via SUBCUTANEOUS
  Administered 2020-08-22: 3 [IU] via SUBCUTANEOUS
  Administered 2020-08-22 – 2020-08-27 (×7): 2 [IU] via SUBCUTANEOUS
  Administered 2020-08-28: 1 [IU] via SUBCUTANEOUS
  Administered 2020-08-29: 5 [IU] via SUBCUTANEOUS
  Administered 2020-08-29: 2 [IU] via SUBCUTANEOUS
  Administered 2020-08-30: 1 [IU] via SUBCUTANEOUS
  Administered 2020-08-30: 2 [IU] via SUBCUTANEOUS
  Administered 2020-08-31: 1 [IU] via SUBCUTANEOUS
  Administered 2020-08-31: 2 [IU] via SUBCUTANEOUS
  Administered 2020-09-01: 1 [IU] via SUBCUTANEOUS

## 2020-08-16 MED ORDER — TACROLIMUS 1 MG PO CAPS
1.0000 mg | ORAL_CAPSULE | Freq: Every day | ORAL | Status: DC
  Administered 2020-08-16: 1 mg via ORAL
  Filled 2020-08-16: qty 1

## 2020-08-16 MED ORDER — ONDANSETRON HCL 4 MG PO TABS: 4.0000 mg | ORAL_TABLET | Freq: Four times a day (QID) | ORAL | Status: DC | PRN

## 2020-08-16 MED ORDER — GUAIFENESIN ER 600 MG PO TB12
600.0000 mg | ORAL_TABLET | Freq: Two times a day (BID) | ORAL | Status: DC | PRN
Start: 2020-08-16 — End: 2020-09-01

## 2020-08-16 MED ORDER — ZOLPIDEM TARTRATE 5 MG PO TABS
5.0000 mg | ORAL_TABLET | Freq: Every evening | ORAL | Status: DC | PRN
  Administered 2020-08-27 – 2020-08-30 (×4): 5 mg via ORAL
  Filled 2020-08-16 (×4): qty 1

## 2020-08-16 MED ORDER — HYDROCODONE-ACETAMINOPHEN 5-325 MG PO TABS
1.0000 | ORAL_TABLET | ORAL | Status: DC | PRN
  Administered 2020-08-16 – 2020-08-17 (×3): 1 via ORAL
  Administered 2020-08-18 – 2020-08-20 (×8): 2 via ORAL
  Administered 2020-08-21: 1 via ORAL
  Administered 2020-08-21 (×2): 2 via ORAL
  Administered 2020-08-22 – 2020-08-23 (×3): 1 via ORAL
  Administered 2020-08-24 – 2020-08-25 (×3): 2 via ORAL
  Administered 2020-08-25: 1 via ORAL
  Administered 2020-08-25: 2 via ORAL
  Administered 2020-08-26 – 2020-08-27 (×3): 1 via ORAL
  Administered 2020-08-30 – 2020-09-01 (×6): 2 via ORAL
  Filled 2020-08-16 (×3): qty 2
  Filled 2020-08-16 (×2): qty 1
  Filled 2020-08-16 (×5): qty 2
  Filled 2020-08-16 (×3): qty 1
  Filled 2020-08-16 (×9): qty 2
  Filled 2020-08-16: qty 1
  Filled 2020-08-16: qty 2
  Filled 2020-08-16 (×2): qty 1
  Filled 2020-08-16 (×2): qty 2
  Filled 2020-08-16: qty 1
  Filled 2020-08-16 (×3): qty 2

## 2020-08-16 MED ORDER — ALBUTEROL SULFATE (2.5 MG/3ML) 0.083% IN NEBU
2.5000 mg | INHALATION_SOLUTION | RESPIRATORY_TRACT | Status: DC | PRN
Start: 2020-08-16 — End: 2020-09-01

## 2020-08-16 MED ORDER — HYDROXYZINE HCL 25 MG PO TABS
25.0000 mg | ORAL_TABLET | Freq: Three times a day (TID) | ORAL | Status: DC | PRN
  Administered 2020-08-25: 25 mg via ORAL
  Filled 2020-08-16: qty 1

## 2020-08-16 MED ORDER — HYDRALAZINE HCL 20 MG/ML IJ SOLN
5.0000 mg | INTRAMUSCULAR | Status: DC | PRN
  Administered 2020-08-17 – 2020-08-30 (×15): 5 mg via INTRAVENOUS
  Filled 2020-08-16 (×14): qty 1

## 2020-08-16 MED ORDER — CALCIUM CARBONATE ANTACID 1250 MG/5ML PO SUSP
500.0000 mg | Freq: Four times a day (QID) | ORAL | Status: DC | PRN
  Filled 2020-08-16: qty 5

## 2020-08-16 MED ORDER — INSULIN GLARGINE 100 UNIT/ML ~~LOC~~ SOLN
10.0000 [IU] | Freq: Every day | SUBCUTANEOUS | Status: DC
  Administered 2020-08-16 – 2020-08-27 (×12): 10 [IU] via SUBCUTANEOUS
  Filled 2020-08-16 (×14): qty 0.1

## 2020-08-16 MED ORDER — HYDROCORTISONE NA SUCCINATE PF 100 MG IJ SOLR
50.0000 mg | Freq: Four times a day (QID) | INTRAMUSCULAR | Status: DC
  Administered 2020-08-16 – 2020-08-17 (×4): 50 mg via INTRAVENOUS
  Filled 2020-08-16 (×4): qty 2

## 2020-08-16 MED ORDER — SORBITOL 70 % SOLN
30.0000 mL | Status: DC | PRN
  Filled 2020-08-16 (×2): qty 30

## 2020-08-16 MED ORDER — PANTOPRAZOLE SODIUM 40 MG PO TBEC
40.0000 mg | DELAYED_RELEASE_TABLET | Freq: Two times a day (BID) | ORAL | Status: DC
  Administered 2020-08-16 – 2020-09-01 (×33): 40 mg via ORAL
  Filled 2020-08-16 (×33): qty 1

## 2020-08-16 MED ORDER — ONDANSETRON HCL 4 MG/2ML IJ SOLN
4.0000 mg | Freq: Four times a day (QID) | INTRAMUSCULAR | Status: DC | PRN
Start: 2020-08-16 — End: 2020-09-01
  Administered 2020-08-17 – 2020-08-31 (×3): 4 mg via INTRAVENOUS
  Filled 2020-08-16 (×3): qty 2

## 2020-08-16 MED ORDER — SODIUM CHLORIDE 0.9 % IV SOLN
500.0000 mg | INTRAVENOUS | Status: DC
  Administered 2020-08-17 – 2020-08-18 (×2): 500 mg via INTRAVENOUS
  Filled 2020-08-16 (×4): qty 500

## 2020-08-16 MED ORDER — TACROLIMUS 1 MG PO CAPS: 1.0000 mg | ORAL_CAPSULE | Freq: Two times a day (BID) | ORAL | Status: DC

## 2020-08-16 MED ORDER — SULFAMETHOXAZOLE-TRIMETHOPRIM 400-80 MG PO TABS
1.0000 | ORAL_TABLET | ORAL | Status: DC
  Administered 2020-08-17 – 2020-08-31 (×7): 1 via ORAL
  Filled 2020-08-16 (×7): qty 1

## 2020-08-16 MED ORDER — SODIUM CHLORIDE 0.9 % IV SOLN
2.0000 g | INTRAVENOUS | Status: DC
  Administered 2020-08-17 – 2020-08-19 (×3): 2 g via INTRAVENOUS
  Filled 2020-08-16 (×3): qty 20

## 2020-08-16 MED ORDER — DOCUSATE SODIUM 283 MG RE ENEM
1.0000 | ENEMA | RECTAL | Status: DC | PRN
  Filled 2020-08-16: qty 1

## 2020-08-16 MED ORDER — ROSUVASTATIN CALCIUM 20 MG PO TABS
20.0000 mg | ORAL_TABLET | Freq: Every day | ORAL | Status: DC
  Administered 2020-08-16 – 2020-08-31 (×16): 20 mg via ORAL
  Filled 2020-08-16 (×16): qty 1

## 2020-08-16 MED ORDER — ACETAMINOPHEN 325 MG PO TABS
650.0000 mg | ORAL_TABLET | Freq: Four times a day (QID) | ORAL | Status: DC | PRN
Start: 2020-08-16 — End: 2020-09-01
  Administered 2020-08-30: 650 mg via ORAL
  Filled 2020-08-16: qty 2

## 2020-08-16 MED ORDER — ACETAMINOPHEN 650 MG RE SUPP: 650.0000 mg | Freq: Four times a day (QID) | RECTAL | Status: DC | PRN

## 2020-08-16 MED ORDER — ROPINIROLE HCL 1 MG PO TABS
3.0000 mg | ORAL_TABLET | Freq: Every day | ORAL | Status: DC
  Administered 2020-08-16 – 2020-08-31 (×16): 3 mg via ORAL
  Filled 2020-08-16 (×16): qty 3

## 2020-08-16 MED ORDER — OXYCODONE HCL 5 MG PO TABS
5.0000 mg | ORAL_TABLET | ORAL | Status: DC | PRN
  Administered 2020-08-17 – 2020-08-31 (×24): 5 mg via ORAL
  Filled 2020-08-16 (×24): qty 1

## 2020-08-16 NOTE — H&P (Signed)
History and Physical    Ruth Riley [id number] DOB: [birth year] DOA: 08/16/2020  PCP: [name], MD Consultants:  [facility] - Nephrology; [facility] - orthopedics Patient coming from:  Home - lives with husband in a retirement apartment; NOK: [place], [place], [phone]  Chief Complaint: fever  HPI: Ruth Riley is a 75 y.o. female with medical history significant of DM; HTN; and h/o renal transplant with current stage 4 CKD (baseline 3.7) with anemia (baseline 9) presenting to [facility] with confusion.  When asked what brought her to the hospital, the patient repeatedly returns to her litany of orthopedic complaints.  Most recently, she did fall and injure her R wrist and then fell again and injured her L hand.  She acknowledges some SOB and cough that appear to have started in the last week; she was feeling better after her COVID infection in early February.  Notes from [facility] indicate that the patient has had intermittent confusion and persistent lethargy with fever to 101.  She was recently started on oxycodone and prednisone and this was thought to be a possible cause for her AMS.    I spoke with her husband.  On February 5, she had COVID and PNA.  Yesterday AM, she woke up about noon with a fever to 101.7 and was incoherent.  She was taken to [facility] and they were concerned about a possible GI bleed (have not noticed this at home).  Also determined that she has COVID-19 again with PNA.  Her agrees with her history about SOB and cough.  She fell and broke her L ring finger; splint sort of in place.  She had swelling on the R hand while sewing about 10 days again; orthopedics gave her cortisone in the wrist.  Since then, her fingers have been numb.  He called back to ortho (Dr. Harmon Pier) and they said it was ok but recommends an MRI.  She woke up with terrible hand pain while in the ER.  She has been in pain and has been receiving pain medications for her back pain - recently treated  with oxy, prednisone, lidocaine patch, Lyrica.  She is also a renal transplant recipient and this is failing; nephrology is recommending pre-emptive graft placement.  She does have some apparent cognitive impairment at baseline, but worse yesterday and increased recent falls.   ED Course: RH to [facility] transfer, per Dr. Hal Hope:  75 year old female with history of Diabetes Mellitus, HTN and CKD baseline creatinine around 3.7 with anemia baseline hemoglobin around 9 was brought in for confusion. Patient was recently placed on hydrocodone for wrist pain likely causing the confusion.   CXR showed Pneumonia for which antibiotics were started. Patient on room air. COVID test pending. Will let us know if positive.   Hemoglobin dropped from 9 to 7.5 with stool for occult blood positive. Patient's stool was dark but clearly melanotic. No Gastroenterologist at [facility] this week so requested transfer.    Review of Systems: As per HPI; otherwise review of systems reviewed and negative.  Poor historian so this was limited.   COVID Vaccine Status:  Complete  Past Medical History:  Diagnosis Date  . Diabetes mellitus type 2 in nonobese (HCC)   . Essential hypertension   . H/O kidney transplant   . Stage 4 chronic kidney disease ([place])     History reviewed. No pertinent surgical history.  Social History   Socioeconomic History  . Marital status: Married    Spouse name: Not  on file  . Number of children: Not on file  . Years of education: Not on file  . Highest education level: Not on file  Occupational History  . Not on file  Tobacco Use  . Smoking status: Never Smoker  . Smokeless tobacco: Never Used  Substance and Sexual Activity  . Alcohol use: Not on file  . Drug use: Not on file  . Sexual activity: Not on file  Other Topics Concern  . Not on file  Social History Narrative  . Not on file   Social Determinants of Health   Financial Resource Strain: Not on file  Food Insecurity:  Not on file  Transportation Needs: Not on file  Physical Activity: Not on file  Stress: Not on file  Social Connections: Not on file  Intimate Partner Violence: Not on file    Allergies  Allergen Reactions  . Penicillins Hives and Other (See Comments)  . Tramadol Other (See Comments) and Palpitations    Patient reports it makes her feel spaced out Patient reports it makes her feel spaced out Patient reports it makes her feel spaced out Patient reports it makes her feel spaced out   . Donepezil Diarrhea and Other (See Comments)  . Amlodipine Swelling    dizziness    History reviewed. No pertinent family history.  Prior to Admission medications   Medication Sig Start Date End Date Taking? Authorizing Provider  carvedilol (COREG) 25 MG tablet Take 25 mg by mouth 2 (two) times daily. 04/14/20   [provider]  cyanocobalamin 1000 MCG tablet Take by mouth.    [provider]  Magnesium-Zinc (MAGNESIUM-CHELATED ZINC) 133.33-5 MG TABS Take by mouth.    [provider]  pantoprazole (PROTONIX) 40 MG tablet Take 40 mg by mouth 2 (two) times daily. 05/10/20   [provider]  predniSONE (DELTASONE) 5 MG tablet  05/10/20   [provider]  rOPINIRole (REQUIP) 3 MG tablet Take 3 mg by mouth at bedtime. 05/03/20   [provider]  rosuvastatin (CRESTOR) 40 MG tablet Take by mouth. 10/19/19 03/31/21  [provider]  sertraline (ZOLOFT) 100 MG tablet Take by mouth. 10/19/19   [provider]  sodium bicarbonate 650 MG tablet Take 2 tablets by mouth 2 (two) times daily. 05/03/20   [provider]  sulfamethoxazole-trimethoprim (BACTRIM) 400-80 MG tablet Take by mouth. 05/03/20   [provider]  tacrolimus (PROGRAF) 1 MG capsule Take by mouth. 04/26/20   [provider]  traZODone (DESYREL) 50 MG tablet Take 50 mg by mouth at bedtime.    [provider]  Vitamin D, Ergocalciferol, (DRISDOL)  1.25 MG (50000 UNIT) CAPS capsule Take 50,000 Units by mouth once a week. 05/18/20   [provider]    Physical Exam: Vitals:   08/16/20 1328 08/16/20 1633  BP: (!) 181/99 (!) 156/92  Pulse: 86 86  Resp: 16 19  Temp: 97.9 F (36.6 C) 97.9 F (36.6 C)  TempSrc: Oral Oral  SpO2: 92% (!) 88%  Weight: 57.6 kg   Height: '5\' 1"'$  (1.549 m)      . General:  Appears calm and comfortable and is in NAD; conversant but very poor historian . Eyes:   EOMI, normal lids, iris . ENT:  grossly normal hearing, lips & tongue, mmm . Neck:  no LAD, masses or thyromegaly . Cardiovascular:  RRR, no m/r/g. No LE edema.  Marland Kitchen Respiratory:   CTA bilaterally with no wheezes/rales/rhonchi.  Normal respiratory effort. Marland Kitchen  Abdomen:  soft, NT, ND . Skin:  no rash or induration seen on limited exam . Musculoskeletal:  L 4th finger is splinted; R hand with mild metacarpal edema and ulnar deviation . Lower extremity:  No LE edema.  Limited foot exam with no ulcerations.  2+ distal pulses. Marland Kitchen Psychiatric:  blunted mood and affect, speech fluent and appropriate but not relevant to questions asked at times . Neurologic:  CN 2-12 grossly intact, moves all extremities in coordinated fashion    Radiological Exams on Admission: Independently reviewed - see discussion in A/P where applicable  CXR 4/4 at [facility]: subtle bibasilar airspace opacities, concerning for PNA AXR negative Head CT negative   EKG: Independently reviewed.  NSR with rate 78; no evidence of acute ischemia   Labs on Admission: I have personally reviewed the available labs and imaging studies at the time of the admission.  Pertinent labs:   BUN 56/Creatinine 3.77/GFR 12; 41/2.8 on 3/9; 61/3.4 at [facility] on 4/4 Albumin 2.6 Procalcitonin 1.10 WBC 5.4 Hgb 8.5 Platelets 134 TSH 2.903 Blood cultures pending   Assessment/Plan Principal Problem:   Acute metabolic encephalopathy Active Problems:   Anemia in chronic kidney disease   Diabetes  mellitus type 2 in nonobese [facility])   Essential hypertension   H/O kidney transplant   Stage 4 chronic kidney disease (HCC)   Bilateral wrist pain   Multifocal pneumonia   History of COVID-19   Chronic back pain    Acute metabolic encephalopathy -Patient presenting with encephalopathy as evidenced by her confusion and lethargy -While the patient does appear to also have dementia, this is a change compared to her usual baseline mental status -This appears to be multifactorial and related to post-COVID PNA as well as possible GI bleed -Infection is complicated in the setting of chronic immunosuppression  -Based on unremarkable evaluation with current ability to protect her airway, will observe for now with IVF hydration, antibiotics. and telemetry monitoring -50% of patients with delirium while hospitalized will be institutionalized at 6 months, and these patients have a 25% mortality at 6 months -The family would benefit from being referred to the [facility] and also provided with the [employer] website  Multifocal PNA with h/o COVID-19 infection -Patient presenting with productive cough, mildly decreased oxygen saturation, and multifocal infiltrates on chest x-ray -This appears to be most likely community-acquired pneumonia.  -She was positive for COVID-19 infection approximately 2/5 (Care Everywhere notes reference positive test at a community testing site in [place]) and so is within the 90-day window; she should no longer be considered to be infectious for COVID -Procalcitonin level is 1.10.   >0.5 indicates infection and >>0.5 indicates more serious disease.  As the procalcitonin level normalizes, it will be reasonable to consider de-escalation of antibiotic coverage.  The sensitivity of procalcitonin is variable and should not be used alone to guide treatment. -CURB-65 score is 3, meaning that the patient has a 14% risk of death. -Pneumonia Severity Index (PSI) is Class 4,  9% mortality. -Will start Azithromycin 500 mg IV daily and Rocephin due to no risk factors for MDR cause  -Additional complicating factors include: hypoxia and immunocompromise. -LR @ 75cc/hr -Fever control -Repeat CBC in am -Will give stress dosed steroids, hydrocortisone 50 mg q6h  Anemia, possible GI bleed -Patient was reported to have Hgb of 7.5 at [facility] and heme positive stools -Patient/husband deny knowledge of bloody stools -Patient was transferred from [facility] for GI consultation and so GI has been  requested to see the patient -Will repeat CBC in AM -Continue Protonix PO BID  Stage 4 CKD, h/o renal transplant -Continue tacrolimus, change prednisone to stress dosed steroids -Continue Bicarb -Replete with IVF -Continue Bactrim 3x/week  DM -Continue Lantus -will cover with sensitive-scale SSI  HTN -Continue Coreg  HLD -Continue Crestor  Wrist pain -She has had recent falls and has B wrist pain -L 4th finger broken but splint is undone; will request ortho tech to replace splint -R hand appears to be more c/w carpal tunnel or nerve impingement.  Patient was d/w Dr. Grandville Silos from hand surgery and he recommends nocturnal splinting on that side and daytime splinting if tolerated. -Outpatient f/u with her orthopedist or Dr. Grandville Silos -If new issues arise, please reconsult Dr. Grandville Silos.  Back pain -I have reviewed this patient in the [place] Controlled Substances Reporting System.  Se is receiving medications from multiple providers but appears to be not be receiving regular prescriptions. -She is not at particularly high risk of opioid misuse, diversion, or overdose. -Continue Oxy IR prn -Continue Requip    Note: This patient has been tested and is negative for the novel coronavirus COVID-19. She has been fully vaccinated against COVID-19.    DVT prophylaxis: SCDs Code Status:  DNR - confirmed with patient Family Communication: None present; I spoke with her husband by telephone  at the time of the admission Disposition Plan:  The patient is from: home  Anticipated d/c is to: home without [facility] services   Anticipated d/c date will depend on clinical response to treatment, but possibly as early as tomorrow if she has excellent response to treatment  Patient is currently: acutely ill Consults called: GI; Ortho hand by telephone only; RT/PT/OT Admission status: It is my clinical opinion that referral for OBSERVATION is reasonable and necessary in this patient based on the above information provided. The aforementioned taken together are felt to place the patient at high risk for further clinical deterioration. However it is anticipated that the patient may be medically stable for discharge from the hospital within 24 to 48 hours.      Karmen Bongo MD [facility]   How to contact the [facility] Attending or Consulting provider [place] or covering provider during after hours [place], for this patient?  1. Check the care team in [facility] and look for a) attending/consulting [facility] provider listed and b) the [facility] team listed 2. Log into [email or web address] and use [place]'s universal password to access. If you do not have the password, please contact the hospital operator. 3. Locate the [facility] provider you are looking for under [facility] and page to a number that you can be directly reached. 4. If you still have difficulty reaching the provider, please page the [facility] (Director on Call) for the Hospitalists listed on amion for assistance.   08/16/2020, 6:06 PM

## 2020-08-16 NOTE — Progress Notes (Signed)
Pt arrived via [facility] from [place], pt A&O x2, skin clean and intact with scattered bruising all over pts body. [facility] admits paged and made ware pt had arrived. Bed placed in lowest position and call bell in reach.  VSS BP (!) 156/92 (BP Location: Left Arm)   Pulse 86   Temp 97.9 F (36.6 C) (Oral)   Resp 19   Ht '5\' 1"'$  (1.549 m)   Wt 57.6 kg   SpO2 (!) 88%   BMI 23.98 kg/m  awaiting admitting orders from MD

## 2020-08-16 NOTE — Consult Note (Addendum)
[facility] Consult: 2:37 PM 08/16/2020  LOS: 0 days    Referring Provider: Dr Lorin Mercy  Primary Care Physician:  [name], MD Primary Gastroenterologist:  Althia Forts.  Dr Virgia Land at [facility]  Hematology: Dayton Scrape, NP at [facility].    Reason for Consultation:  GI bleed.  Anemia.   HPI: Ruth Riley is a 75 y.o. female.  PMH: GERD. IDDM 2.  Senile purpura.  Impaired cognition.  OSA, on CPap.   CMV PNA 12/2017.  Polycystic kidney disease.  ESRD.  S/p renal transplant.  Per nephrology at [facility] her transplant is "on the precipice of failing", and she has stage 5 CKD.  Chronic immunosuppressive therapy.  Osteoarthritis and osteopenia.  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 Received Retacrit on 05/26/20, 08/08/2020. Additionally she has required blood transfusions on several occasions in the past,  Surgeries include appendectomy, bunionectomy, C-section, bilateral knee replacements, rotator cuff repair 04/16/2017 EGD at [facility]. Candida esophagitis per limited notes.  Unable to access procedure note.  Path report: Acute esophagitis with focal ulceration. Rare fungal (hyphae) organisms identified.  Colonoscopy was unremarkable, per patient recall, and she thinks it was done in [place] more than 5 years ago.  Diagnosed with breakthrough (Vaxd x 3) COVID-19 w PNA on 06/23/20.  Admit a few days at [facility].  Started on oxygen, eventually weaned after return home.  Treated with Decadron but no remdesivir.  The steroid burst caused elevation of her blood sugars. She is also currently taking oxycodone and prednisone for carpal tunnel issues.  This has caused some constipation.  Other pertinent  home medication is Protonix 40 mg po bid.  Presented to [facility] and ultimately transferred to [facility] hospital for further care.  Husband reported intermittent confusion and persistent lethargy starting this morning with fever 101 at home.  Patient denies blood in her stool but says her stools have been dark, formed, almost black for the past week.  She has a good appetite.  No nausea or vomiting.  At the suggestion of her MD, try to come off PPI several months ago but this resulted in lots of heartburn and nausea and vomiting which resolved with resumption of PPI. Several falls at home in the setting of feeling woozy and having balance problems.  She reports fracture to her right wrist and to the ring finger on her left hand as a result of the falls.  Also has problems with bilateral rotator cuff tears. Reports epistaxis about once a month, enough bleeding that it requires her to hold pressure across her nose to stop the bleeding.  She has not mentioned this to her doctors or been referred to an ENT.  In ED initial blood pressure elevated at 180/93 with heart rate 77, temperature 98.2 pulse ox 95%.  Hgb 7.4.  Platelets 122.  BUN/creatinine 61/3.4 Head CT no acute changes CXR with bibasilar opacities C/W possible PNA. KUB unremarkable On DRE she had a firm, dark stool  in the rectum which tested FOBT positive.   Hgb ranged between 10.6 -9.5 from October 2021 through early March 2022.  8.9 on 3/18.  MCV normal.    Non-/never smoker.  No history of alcohol use.  This with her husband.  Her daughter lives in [place] which is how she and her husband ended up living there after living in multiple locations.      Prior to Admission medications   Medication Sig Start Date End Date Taking? Authorizing Provider  carvedilol (COREG) 25 MG tablet Take 25 mg by mouth 2 (two) times daily. 04/14/20  Yes [provider]  cyanocobalamin 1000 MCG tablet Take by mouth.   Yes [provider]   Magnesium-Zinc (MAGNESIUM-CHELATED ZINC) 133.33-5 MG TABS Take by mouth.   Yes [provider]  pantoprazole (PROTONIX) 40 MG tablet Take 40 mg by mouth 2 (two) times daily. 05/10/20  Yes [provider]  predniSONE (DELTASONE) 5 MG tablet  05/10/20  Yes [provider]  rOPINIRole (REQUIP) 3 MG tablet Take 3 mg by mouth at bedtime. 05/03/20  Yes [provider]  rosuvastatin (CRESTOR) 40 MG tablet Take 20 mg by mouth. 10/19/19 03/31/21 Yes [provider]  sodium bicarbonate 650 MG tablet Take 2 tablets by mouth 2 (two) times daily. 05/03/20  Yes [provider]  sulfamethoxazole-trimethoprim (BACTRIM) 400-80 MG tablet Take by mouth 3 (three) times a week. 05/03/20  Yes [provider]  tacrolimus (PROGRAF) 1 MG capsule Take by mouth. 2 mg qAM, 1 mg qhs 04/26/20  Yes [provider]  Vitamin D, Ergocalciferol, (DRISDOL) 1.25 MG (50000 UNIT) CAPS capsule Take 50,000 Units by mouth once a week. 05/18/20  Yes [provider]  sertraline (ZOLOFT) 100 MG tablet Take by mouth. Supposed to be discontinued but ?still taking 10/19/19   [provider]    Scheduled Meds: . carvedilol  25 mg Oral BID  . hydrocortisone sod succinate (SOLU-CORTEF) inj  50 mg Intravenous Q6H  . pantoprazole  40 mg Oral BID  . rOPINIRole  3 mg Oral QHS  . rosuvastatin  20 mg Oral Daily  . sodium bicarbonate  1,300 mg Oral BID  . [START ON 08/17/2020] sulfamethoxazole-trimethoprim  1 tablet Oral Once per day on Mon Wed Fri   Infusions: . sodium chloride    . [START ON 08/17/2020] azithromycin    . [START ON 08/17/2020] cefTRIAXone (ROCEPHIN)  IV     PRN Meds: acetaminophen **OR** acetaminophen, albuterol, calcium carbonate (dosed in mg elemental calcium), camphor-menthol **AND** hydrOXYzine, docusate sodium, feeding supplement (NEPRO CARB STEADY), guaiFENesin, hydrALAZINE, HYDROcodone-acetaminophen, ondansetron **OR** ondansetron (ZOFRAN) IV,  sorbitol, zolpidem   Allergies as of 08/15/2020 - Review Complete 08/08/2020  Allergen Reaction Noted  . Penicillins Hives and Other (See Comments) 06/22/2015  . Tramadol Other (See Comments) and Palpitations 02/26/2012  . Donepezil Diarrhea and Other (See Comments) 02/04/2019    History reviewed. No pertinent family history.  Social History   Socioeconomic History  . Marital status: Married    Spouse name: Not on file  . Number of children: Not on file  . Years of education: Not on file  . Highest education level: Not on file  Occupational History  . Not on file  Tobacco Use  . Smoking status: Never Smoker  . Smokeless tobacco: Never Used  Substance and Sexual Activity  . Alcohol use: Not on file  . Drug use: Not on file  . Sexual activity: Not on file  Other Topics  Concern  . Not on file  Social History Narrative  . Not on file   Social Determinants of Health   Financial Resource Strain: Not on file  Food Insecurity: Not on file  Transportation Needs: Not on file  Physical Activity: Not on file  Stress: Not on file  Social Connections: Not on file  Intimate Partner Violence: Not on file    REVIEW OF SYSTEMS: Constitutional: Generally is weak.  No increase in fatigue or profound weakness ENT:  No nose bleeds Pulm: Active cough, no dyspnea at rest. CV:  No palpitations, no LE edema.  GU:  No hematuria, no frequency GI: See HPI Heme: No unusual bleeding or bruising. Transfusions: On several occasions over the last several years. Neuro:  No headaches, no peripheral tingling or numbness Derm:  No itching, no rash or sores.  Endocrine:  No sweats or chills.  No polyuria or dysuria Immunization: Reviewed.  She received Pfizer COVID-19 vaccine x3, the latest was 04/26/2020.   PHYSICAL EXAM: Vital signs in last 24 hours: Vitals:   08/16/20 1328  BP: (!) 181/99  Pulse: 86  Resp: 16  Temp: 97.9 F (36.6 C)  SpO2: 92%   Wt Readings from Last 3 Encounters:   08/16/20 57.6 kg  08/08/20 56.7 kg  05/26/20 56.8 kg    General: Chronically ill-appearing, frail but alert and comfortable. Head: No facial asymmetry or swelling.  No signs of head trauma. Eyes: No conjunctival pallor or scleral icterus.   EOMI.  Arcus senilis Ears: Wearing bilateral hearing aids and having no problem with normal volume conversation. Nose: No congestion or discharge Mouth: Mucosa is clear but dry.  Tongue is midline.  Good dental repair. Neck: No JVD, no masses, no thyromegaly. Lungs: Diminished breath sounds in the bases.  Rough/mucoid cough.  No dyspnea. Heart: RRR.  No MRG.  S1, S2 present Abdomen: Soft.  Not tender.  Active bowel sounds.  No HSM, masses, bruits, hernias..   Rectal: Not repeated.  Had FOBT positive dark stool on DRE earlier today at [facility] Musc/Skeltl: Right wrist is swollen and tender but not bruised.  Left hand with arthritic changes.  Left ring finger is in a splint Extremities: No CCE. Neurologic: Appropriate.  Oriented x3.  Moves all 4 limbs, range of motion not assayed.  Strength not assayed.  No tremor or gross deficits.  Little bit of trouble recalling details and rambling historian but mostly able to give a good history Skin: On incomplete Derm survey had no suspicious lesions, rash, telangiectasia. Nodes: No cervical adenopathy Psych: Cooperative, calm, pleasant.  Intake/Output from previous day: No intake/output data recorded. Intake/Output this shift: No intake/output data recorded.  LAB RESULTS: No results for input(s): WBC, HGB, HCT, PLT in the last 72 hours. BMET Lab Results  Component Value Date   NA 138 07/20/2020   NA 133 (A) 06/22/2020   NA 141 05/25/2020   K 4.8 07/20/2020   K 4.0 06/22/2020   K 4.6 05/25/2020   CL 110 (A) 07/20/2020   CL 103 06/22/2020   CL 110 (A) 05/25/2020   CO2 19 07/20/2020   CO2 20 06/22/2020   CO2 21 05/25/2020   BUN 41 (A) 07/20/2020   BUN 57 (A) 06/22/2020   BUN 54 (A)  05/25/2020   CREATININE 2.8 (A) 07/20/2020   CREATININE 3.2 (A) 06/22/2020   CREATININE 3.3 (A) 05/25/2020   CALCIUM 8.8 07/20/2020   CALCIUM 8.1 (A) 06/22/2020   CALCIUM 9.2 05/25/2020  LFT No results for input(s): PROT, ALBUMIN, AST, ALT, ALKPHOS, BILITOT, BILIDIR, IBILI in the last 72 hours. PT/INR No results found for: INR, PROTIME Hepatitis Panel No results for input(s): HEPBSAG, HCVAB, HEPAIGM, HEPBIGM in the last 72 hours. C-Diff No components found for: CDIFF Lipase  No results found for: LIPASE  Drugs of Abuse  No results found for: LABOPIA, COCAINSCRNUR, LABBENZ, AMPHETMU, THCU, LABBARB   RADIOLOGY STUDIES: No results found.    IMPRESSION:   *    Acute on chronic anemia. 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  Last received Retacrit 8 days ago.  Transfusions in past years but not within the last 12 months. On oral B12 at home, no po iron at home  *    FOBT positive dark, formed stool on DRE. The only endoscopic study of record was an EGD in 04/2017 revealing Candida esophagitis (pathology report found but operative report not found).   Remote colonoscopy > 5 years ago pt reports as unremarkable.   GERD w sxs controlled on bid Pantoprazole.   Pt reports periodic epistaxis but none for about a month.  *   AMS.  This may be due to oxycodone.  *    Stage 5 CKD in patient with failing renal transplant, hx polycystic kidney disease. Immunosuppressive drugs include tacrolimus, prednisone,  *    Bilateral pneumonia, CAP.  Patient hospitalized briefly in early February 2022 with breakthrough Covid pneumonia. Zithromax, Rocephin in place  *     Carpal tunnel syndrome.  R wrist and L ring finger fracture.  Being treated with prednisone 10 mg daily (already takes 5 mg daily likely for immunosuppression), oxycodone.  *   Lethargy, AMS at home PTA.  Overall cognitively intact during my encounter w pt this afternoon.  Head CT at [facility] was non-acute.      PLAN:     *    Per Dr Loletha Carrow who will round later today. Suspect pt will need upper endoscopy.  Seems frail for colonoscopy.  Right now pneumonia needs to get better before any scoping procedures/sedation. Continue Protonix 40 mg po bid as doing.  Currently on clear liquid diet.  I think she could probably eat solid food as she has had a good appetite and no nausea or vomiting.  Since it is going to be a couple of days before we would scope her I advanced to [facility] diet.     Azucena Freed  08/16/2020, 2:37 PM Phone [phone]  I have reviewed the entire case in detail with the above APP and discussed the plan in detail.  Therefore, I agree with the diagnoses recorded above. In addition,  I have personally interviewed and examined the patient and have personally reviewed any abdominal/pelvic CT scan images.  My additional thoughts are as follows:  I found it difficult to communicate with this patient and her husband to get a clear consistent history.  Majority the history was obtained from chart review.  Also, her husband is particularly focused on making sure a wrist problem is attended to, she receives her immunosuppressive medicine and can get some nutrition.  This patient has acute on chronic anemia and is reportedly heme positive.  Is not clear she has having overt bleeding such as melena or bright red blood per rectum at present, but observation here in the next couple of days should tell us that. She had previous endoscopic work-up, details not entirely available. Suspect much of her anemia is from  chronic kidney disease.  Probable endoscopic testing such as EGD and possibly colonoscopy later in the admission.  She is quite congested and has expiratory wheezing and her respiratory status is currently not optimal for sedation to allow endoscopic procedures other than an emergency.  For liquid diet ordered  I spoke with the patient's nurse, and she said that in fact this patient had  received all her necessary medicines including her immunosuppressive treatment upon arrival earlier today.  I asked her to reassure the patient and her husband in that regard.  We will follow her with you. Nelida Meuse III Office:[phone]

## 2020-08-17 ENCOUNTER — Observation Stay (HOSPITAL_COMMUNITY): Payer: Medicare Other

## 2020-08-17 DIAGNOSIS — B379 Candidiasis, unspecified: Secondary | ICD-10-CM | POA: Diagnosis not present

## 2020-08-17 DIAGNOSIS — G8929 Other chronic pain: Secondary | ICD-10-CM | POA: Diagnosis present

## 2020-08-17 DIAGNOSIS — D649 Anemia, unspecified: Secondary | ICD-10-CM | POA: Diagnosis not present

## 2020-08-17 DIAGNOSIS — I129 Hypertensive chronic kidney disease with stage 1 through stage 4 chronic kidney disease, or unspecified chronic kidney disease: Secondary | ICD-10-CM | POA: Diagnosis not present

## 2020-08-17 DIAGNOSIS — K529 Noninfective gastroenteritis and colitis, unspecified: Secondary | ICD-10-CM | POA: Diagnosis not present

## 2020-08-17 DIAGNOSIS — E1122 Type 2 diabetes mellitus with diabetic chronic kidney disease: Secondary | ICD-10-CM | POA: Diagnosis present

## 2020-08-17 DIAGNOSIS — Y830 Surgical operation with transplant of whole organ as the cause of abnormal reaction of the patient, or of later complication, without mention of misadventure at the time of the procedure: Secondary | ICD-10-CM | POA: Diagnosis present

## 2020-08-17 DIAGNOSIS — Z20822 Contact with and (suspected) exposure to covid-19: Secondary | ICD-10-CM | POA: Diagnosis present

## 2020-08-17 DIAGNOSIS — I429 Cardiomyopathy, unspecified: Secondary | ICD-10-CM | POA: Diagnosis present

## 2020-08-17 DIAGNOSIS — I351 Nonrheumatic aortic (valve) insufficiency: Secondary | ICD-10-CM | POA: Diagnosis not present

## 2020-08-17 DIAGNOSIS — K6289 Other specified diseases of anus and rectum: Secondary | ICD-10-CM | POA: Diagnosis not present

## 2020-08-17 DIAGNOSIS — Z940 Kidney transplant status: Secondary | ICD-10-CM | POA: Diagnosis not present

## 2020-08-17 DIAGNOSIS — I219 Acute myocardial infarction, unspecified: Secondary | ICD-10-CM | POA: Diagnosis not present

## 2020-08-17 DIAGNOSIS — I361 Nonrheumatic tricuspid (valve) insufficiency: Secondary | ICD-10-CM | POA: Diagnosis not present

## 2020-08-17 DIAGNOSIS — R4189 Other symptoms and signs involving cognitive functions and awareness: Secondary | ICD-10-CM | POA: Diagnosis present

## 2020-08-17 DIAGNOSIS — B3789 Other sites of candidiasis: Secondary | ICD-10-CM | POA: Diagnosis not present

## 2020-08-17 DIAGNOSIS — G9341 Metabolic encephalopathy: Secondary | ICD-10-CM | POA: Diagnosis present

## 2020-08-17 DIAGNOSIS — I330 Acute and subacute infective endocarditis: Secondary | ICD-10-CM | POA: Diagnosis present

## 2020-08-17 DIAGNOSIS — E11319 Type 2 diabetes mellitus with unspecified diabetic retinopathy without macular edema: Secondary | ICD-10-CM | POA: Diagnosis present

## 2020-08-17 DIAGNOSIS — E119 Type 2 diabetes mellitus without complications: Secondary | ICD-10-CM | POA: Diagnosis not present

## 2020-08-17 DIAGNOSIS — R040 Epistaxis: Secondary | ICD-10-CM | POA: Diagnosis not present

## 2020-08-17 DIAGNOSIS — I339 Acute and subacute endocarditis, unspecified: Secondary | ICD-10-CM | POA: Diagnosis not present

## 2020-08-17 DIAGNOSIS — R531 Weakness: Secondary | ICD-10-CM | POA: Diagnosis not present

## 2020-08-17 DIAGNOSIS — J189 Pneumonia, unspecified organism: Secondary | ICD-10-CM | POA: Diagnosis present

## 2020-08-17 DIAGNOSIS — R296 Repeated falls: Secondary | ICD-10-CM | POA: Diagnosis present

## 2020-08-17 DIAGNOSIS — K31811 Angiodysplasia of stomach and duodenum with bleeding: Secondary | ICD-10-CM | POA: Diagnosis present

## 2020-08-17 DIAGNOSIS — T8619 Other complication of kidney transplant: Secondary | ICD-10-CM | POA: Diagnosis present

## 2020-08-17 DIAGNOSIS — B49 Unspecified mycosis: Secondary | ICD-10-CM | POA: Diagnosis present

## 2020-08-17 DIAGNOSIS — M25531 Pain in right wrist: Secondary | ICD-10-CM | POA: Diagnosis not present

## 2020-08-17 DIAGNOSIS — N179 Acute kidney failure, unspecified: Secondary | ICD-10-CM | POA: Diagnosis present

## 2020-08-17 DIAGNOSIS — D849 Immunodeficiency, unspecified: Secondary | ICD-10-CM | POA: Diagnosis present

## 2020-08-17 DIAGNOSIS — I340 Nonrheumatic mitral (valve) insufficiency: Secondary | ICD-10-CM | POA: Diagnosis not present

## 2020-08-17 DIAGNOSIS — K921 Melena: Secondary | ICD-10-CM

## 2020-08-17 DIAGNOSIS — I120 Hypertensive chronic kidney disease with stage 5 chronic kidney disease or end stage renal disease: Secondary | ICD-10-CM | POA: Diagnosis present

## 2020-08-17 DIAGNOSIS — D500 Iron deficiency anemia secondary to blood loss (chronic): Secondary | ICD-10-CM | POA: Diagnosis not present

## 2020-08-17 DIAGNOSIS — D62 Acute posthemorrhagic anemia: Secondary | ICD-10-CM | POA: Diagnosis present

## 2020-08-17 DIAGNOSIS — R509 Fever, unspecified: Secondary | ICD-10-CM | POA: Diagnosis not present

## 2020-08-17 DIAGNOSIS — I10 Essential (primary) hypertension: Secondary | ICD-10-CM | POA: Diagnosis not present

## 2020-08-17 DIAGNOSIS — M069 Rheumatoid arthritis, unspecified: Secondary | ICD-10-CM | POA: Diagnosis present

## 2020-08-17 DIAGNOSIS — R195 Other fecal abnormalities: Secondary | ICD-10-CM | POA: Diagnosis not present

## 2020-08-17 DIAGNOSIS — Z96653 Presence of artificial knee joint, bilateral: Secondary | ICD-10-CM | POA: Diagnosis not present

## 2020-08-17 DIAGNOSIS — D631 Anemia in chronic kidney disease: Secondary | ICD-10-CM | POA: Diagnosis present

## 2020-08-17 DIAGNOSIS — N184 Chronic kidney disease, stage 4 (severe): Secondary | ICD-10-CM | POA: Diagnosis not present

## 2020-08-17 DIAGNOSIS — G4733 Obstructive sleep apnea (adult) (pediatric): Secondary | ICD-10-CM | POA: Diagnosis present

## 2020-08-17 DIAGNOSIS — B376 Candidal endocarditis: Secondary | ICD-10-CM | POA: Diagnosis not present

## 2020-08-17 DIAGNOSIS — D8481 Immunodeficiency due to conditions classified elsewhere: Secondary | ICD-10-CM | POA: Diagnosis not present

## 2020-08-17 DIAGNOSIS — N185 Chronic kidney disease, stage 5: Secondary | ICD-10-CM | POA: Diagnosis present

## 2020-08-17 DIAGNOSIS — W19XXXA Unspecified fall, initial encounter: Secondary | ICD-10-CM | POA: Diagnosis present

## 2020-08-17 DIAGNOSIS — M545 Low back pain, unspecified: Secondary | ICD-10-CM | POA: Diagnosis not present

## 2020-08-17 DIAGNOSIS — Z66 Do not resuscitate: Secondary | ICD-10-CM | POA: Diagnosis present

## 2020-08-17 DIAGNOSIS — N189 Chronic kidney disease, unspecified: Secondary | ICD-10-CM | POA: Diagnosis not present

## 2020-08-17 DIAGNOSIS — K922 Gastrointestinal hemorrhage, unspecified: Secondary | ICD-10-CM | POA: Diagnosis not present

## 2020-08-17 DIAGNOSIS — K219 Gastro-esophageal reflux disease without esophagitis: Secondary | ICD-10-CM | POA: Diagnosis present

## 2020-08-17 DIAGNOSIS — E785 Hyperlipidemia, unspecified: Secondary | ICD-10-CM | POA: Diagnosis present

## 2020-08-17 DIAGNOSIS — M25532 Pain in left wrist: Secondary | ICD-10-CM | POA: Diagnosis not present

## 2020-08-17 LAB — BASIC METABOLIC PANEL
Anion gap: 7 (ref 5–15)
BUN: 56 mg/dL — ABNORMAL HIGH (ref 8–23)
CO2: 26 mmol/L (ref 22–32)
Calcium: 7.9 mg/dL — ABNORMAL LOW (ref 8.9–10.3)
Chloride: 100 mmol/L (ref 98–111)
Creatinine, Ser: 3.54 mg/dL — ABNORMAL HIGH (ref 0.44–1.00)
GFR, Estimated: 13 mL/min — ABNORMAL LOW (ref >60)
Glucose, Bld: 200 mg/dL — ABNORMAL HIGH (ref 70–99)
Potassium: 4.9 mmol/L (ref 3.5–5.1)
Sodium: 133 mmol/L — ABNORMAL LOW (ref 135–145)

## 2020-08-17 LAB — CBC
HCT: 22.2 % — ABNORMAL LOW (ref 36.0–46.0)
Hemoglobin: 7.1 g/dL — ABNORMAL LOW (ref 12.0–15.0)
MCH: 29.8 pg (ref 26.0–34.0)
MCHC: 32 g/dL (ref 30.0–36.0)
MCV: 93.3 fL (ref 80.0–100.0)
Platelets: 125 10*3/uL — ABNORMAL LOW (ref 150–400)
RBC: 2.38 MIL/uL — ABNORMAL LOW (ref 3.87–5.11)
RDW: 16.5 % — ABNORMAL HIGH (ref 11.5–15.5)
WBC: 4.4 10*3/uL (ref 4.0–10.5)
nRBC: 0 % (ref 0.0–0.2)

## 2020-08-17 LAB — PREPARE RBC (CROSSMATCH)

## 2020-08-17 LAB — GLUCOSE, CAPILLARY
Glucose-Capillary: 148 mg/dL — ABNORMAL HIGH (ref 70–99)
Glucose-Capillary: 264 mg/dL — ABNORMAL HIGH (ref 70–99)
Glucose-Capillary: 348 mg/dL — ABNORMAL HIGH (ref 70–99)
Glucose-Capillary: 193 mg/dL — ABNORMAL HIGH (ref 70–99)

## 2020-08-17 LAB — ABO/RH: ABO/RH(D): O NEG

## 2020-08-17 MED ORDER — TACROLIMUS 1 MG PO CAPS
1.0000 mg | ORAL_CAPSULE | Freq: Three times a day (TID) | ORAL | Status: DC
  Administered 2020-08-17 – 2020-08-29 (×35): 1 mg via ORAL
  Filled 2020-08-17 (×35): qty 1

## 2020-08-17 MED ORDER — SODIUM CHLORIDE 0.9 % IV BOLUS
250.0000 mL | Freq: Once | INTRAVENOUS | Status: AC
  Administered 2020-08-17: 250 mL via INTRAVENOUS

## 2020-08-17 MED ORDER — SODIUM CHLORIDE 0.9% IV SOLUTION: Freq: Once | INTRAVENOUS | Status: AC

## 2020-08-17 MED ORDER — SERTRALINE HCL 100 MG PO TABS
100.0000 mg | ORAL_TABLET | Freq: Every day | ORAL | Status: DC
  Administered 2020-08-17 – 2020-08-31 (×15): 100 mg via ORAL
  Filled 2020-08-17 (×15): qty 1

## 2020-08-17 MED ORDER — HYDROCORTISONE NA SUCCINATE PF 100 MG IJ SOLR
50.0000 mg | Freq: Two times a day (BID) | INTRAMUSCULAR | Status: DC
  Administered 2020-08-17 – 2020-08-20 (×6): 50 mg via INTRAVENOUS
  Filled 2020-08-17 (×6): qty 2

## 2020-08-17 NOTE — Evaluation (Signed)
Physical Therapy Evaluation Patient Details Name: Ruth Riley MRN: [id number] DOB: [birth year] Today's Date: 08/17/2020   History of Present Illness  75 y.o. female presenting to [facility] with confusion, SoB, and cought, transferred to [facility] and admitted 08/16/20. Pt being treated for anemia, bilateral PNA and possible GI bleed.  PMH: DM; HTN; and h/o renal transplant with current stage 4 CKD (baseline 3.7) with anemia (baseline 9), COVID infection in early February. hx of multiple falls and orthopedic complaints most recently L ring finger fx.  Clinical Impression  PTA pt living with husband in ALF apartment at [employer] with level entry and elevator to apartment. Pt's husband reports prior to pt's recent fall, pt was independent in mobility with her Rollator and was able to perform bathing and dressing as well as basic iADLs. Pt is currently limited by pain (R wrist, L 4th finger, L rib cage, and historic back pain) in presence of decreased ROM especially in UE and generalized weakness and decreased balance. Pt is currently modAx 1-2 for bed mobility, transfers and short distance ambulation with HHA. PT recommending short bout of SNF level rehab prior to returning to her home environment. PT will continue to follow acutely.     Follow Up Recommendations SNF    Equipment Recommendations  Other (comment) (has necessary equipment)       Precautions / Restrictions Precautions Precautions: Fall Precaution Comments: multple falls, increased pain medication Required Braces or Orthoses: Splint/Cast Splint/Cast: on L ring finger, R hand brace not on due to increased swelling Restrictions Weight Bearing Restrictions: No      Mobility  Bed Mobility Overal bed mobility: Needs Assistance Bed Mobility: Rolling;Supine to Sit Rolling: Mod assist   Supine to sit: Mod assist;+2 for physical assistance     General bed mobility comments: pt endorses she uses log rolling technique for coming  to EoB to reduce back pain. modAx2 for management of LE off bed and for bringing trunk to upright    Transfers Overall transfer level: Needs assistance Equipment used: 1 person hand held assist;2 person hand held assist Transfers: Sit to/from [facility] Sit to Stand: Mod assist;+2 physical assistance Stand pivot transfers: Mod assist;+2 physical assistance       General transfer comment: modAx2 person HHA for initial sit<>stand from bed, in standing pt with incontinence of urine, sat back down and cleaned up, pt unable to bear weight through bilateral hands so provided support at elbows pt agrees to transfer to [facility], again modAx2 for stand pivot to [facility] on L. pt with 1 person HHA for sit<>stand from [facility], OT provided pericare  Ambulation/Gait Ambulation/Gait assistance: Mod assist Gait Distance (Feet): 3 Feet Assistive device: 1 person hand held assist Gait Pattern/deviations: Step-to pattern;Decreased step length - right;Decreased step length - left;Shuffle Gait velocity: slowed Gait velocity interpretation: <1.31 ft/sec, indicative of household ambulator General Gait Details: modA with face to face transfer, PT supported pt bilateral UE on forearms and provided assist at hips with use of gait belt to take small steps from [facility] to recliner, vc for sequencing and sitting        Balance Overall balance assessment: Needs assistance Sitting-balance support: Feet supported;No upper extremity supported Sitting balance-Leahy Scale: Fair     Standing balance support: Bilateral upper extremity supported;During functional activity Standing balance-Leahy Scale: Poor Standing balance comment: pt requires outside support to steady  Pertinent Vitals/Pain Pain Assessment: Faces Faces Pain Scale: Hurts even more Pain Location: R wrist, L ring finger, L bruised ribs, low back Pain Descriptors / Indicators: Grimacing;Guarding Pain  Intervention(s): Limited activity within patient's tolerance;Monitored during session;Repositioned    Home Living Family/patient expects to be discharged to:: Private residence Living Arrangements: Spouse/significant other Available Help at Discharge: Family;Available 24 hours/day Type of Home: Apartment (independent living at [facility]) Home Access: Level entry;Elevator     Home Layout: One level Home Equipment: Shower seat;Grab bars - toilet;Grab bars - tub/shower;Hand held shower head;Walker - 4 wheels;Wheelchair - Education administrator (comment) (sleep number bed) Additional Comments: husband assisted with PLOF, and home set up    Prior Function Level of Independence: Needs assistance   Gait / Transfers Assistance Needed: ambulates with Rollator  ADL's / Homemaking Assistance Needed: independent with bathing and dressing, and        Hand Dominance   Dominant Hand: Left    Extremity/Trunk Assessment   Upper Extremity Assessment Upper Extremity Assessment: Defer to OT evaluation RUE Deficits / Details: R carpel tunnel symptoms, wcortizone shot numbness, 3 weeks ago, R wrist swelling since hospitalization, hx of rotator cuff injury LUE Deficits / Details: L ring finger fx, splinted, hx of rotator cuff injury    Lower Extremity Assessment Lower Extremity Assessment: RLE deficits/detail;LLE deficits/detail RLE Deficits / Details: ROM WFL, strength grossly 3/5 RLE Sensation: WNL LLE Deficits / Details: hx of L knee dislocation, residual nerve damage reports impair sensation but does not exhibit on exam    Cervical / Trunk Assessment Cervical / Trunk Assessment: Other exceptions (L rib bruise from fall) Cervical / Trunk Exceptions: L3-L4 herniated discs  Communication   Communication: HOH;Other (comment) (wears hearing aides)  Cognition Arousal/Alertness: Awake/alert Behavior During Therapy: Flat affect Overall Cognitive Status: Impaired/Different from baseline Area of  Impairment: Following commands;Problem solving;Memory                     Memory: Decreased short-term memory Following Commands: Follows one step commands with increased time;Follows multi-step commands inconsistently;Follows multi-step commands with increased time     Problem Solving: Requires verbal cues;Requires tactile cues;Difficulty sequencing General Comments: pt husband provides PLOF and home set up, likely pt could provide answers  however husband very protective      General Comments General comments (skin integrity, edema, etc.): Husband present throughout session, MD present requesting R UE elevation and review of IS during session    Exercises Other Exercises Other Exercises: IS x5, max inhalation 800 mL   Assessment/Plan    PT Assessment Patient needs continued PT services  PT Problem List Decreased strength;Decreased range of motion;Decreased activity tolerance;Decreased balance;Decreased mobility;Decreased cognition;Pain       PT Treatment Interventions DME instruction;Gait training;Functional mobility training;Therapeutic activities;Therapeutic exercise;Balance training;Cognitive remediation;Patient/family education    PT Goals (Current goals can be found in the Care Plan section)  Acute Rehab PT Goals Patient Stated Goal: feel better PT Goal Formulation: With patient/family Time For Goal Achievement: 08/31/20 Potential to Achieve Goals: Good    Frequency Min 3X/week        Co-evaluation PT/OT/SLP Co-Evaluation/Treatment: Yes Reason for Co-Treatment: Complexity of the patient's impairments (multi-system involvement);Necessary to address cognition/behavior during functional activity PT goals addressed during session: Mobility/safety with mobility         AM-PAC PT "6 Clicks" Mobility  Outcome Measure Help needed turning from your back to your side while in a flat bed without using bedrails?: A Lot Help needed moving  from lying on your back to  sitting on the side of a flat bed without using bedrails?: A Lot Help needed moving to and from a bed to a chair (including a wheelchair)?: A Lot Help needed standing up from a chair using your arms (e.g., wheelchair or bedside chair)?: A Lot Help needed to walk in hospital room?: Total Help needed climbing 3-5 steps with a railing? : Total 6 Click Score: 10    End of Session Equipment Utilized During Treatment: Gait belt (splint on L ring finger) Activity Tolerance: Patient tolerated treatment well Patient left: in chair;with call bell/phone within reach;with chair alarm set;with family/visitor present Nurse Communication: Mobility status;Other (comment) (IV beeping) PT Visit Diagnosis: Unsteadiness on feet (R26.81);Muscle weakness (generalized) (M62.81);History of falling (Z91.81);Difficulty in walking, not elsewhere classified (R26.2);Pain    Time: [id number] PT Time Calculation (min) (ACUTE ONLY): 63 min   Charges:   PT Evaluation $PT Eval Moderate Complexity: 1 Mod PT Treatments $Therapeutic Activity: 8-22 mins        Norene Oliveri B. Migdalia Dk PT, DPT Acute Rehabilitation Services Pager [phone] Office [phone]   [place] 08/17/2020, 2:51 PM

## 2020-08-17 NOTE — Evaluation (Signed)
Occupational Therapy Evaluation Patient Details Name: Ruth Riley MRN: [id number] DOB: [birth year] Today's Date: 08/17/2020    History of Present Illness 76 y.o. female presenting to [facility] with confusion, SoB, and cought, transferred to [facility] and admitted 08/16/20. Pt being treated for anemia, bilateral PNA and possible GI bleed.  PMH: DM; HTN; and h/o renal transplant with current stage 4 CKD (baseline 3.7) with anemia (baseline 9), COVID infection in early February. hx of multiple falls and orthopedic complaints most recently L ring finger fx.   Clinical Impression   Pt PTA: Pt was nearly independent in the past month prior to recent falls for ADL and rollator for mobility. Pt from [place] apartment. Pt currently, limited by decreased strength, decreased ability to care for self, decreased activity tolerance and decreased cognition.  Pt maxA to totalA for ADL due to R wrist swelling with hand numbness and L ring finger fx. Pt appears to be in a fog with assist for answers from spouse and not able to pinpoint exactly what hurts. Pt modA +2 for bed mobility and transfers.  Pt with supportive spouse, but physically he cannot assist pt very much due to his own ailments and pt unable to do so with essentially due to injured [place]. Pt would benefit from continued OT skilled services. OT following acutely.     Follow Up Recommendations  SNF    Equipment Recommendations  3 in 1 bedside commode    Recommendations for Other Services       Precautions / Restrictions Precautions Precautions: Fall Precaution Comments: multple falls, increased pain medication Required Braces or Orthoses: Splint/Cast Splint/Cast: on L ring finger, R hand brace not on due to increased swelling Restrictions Weight Bearing Restrictions: No      Mobility Bed Mobility Overal bed mobility: Needs Assistance Bed Mobility: Rolling;Supine to Sit Rolling: Mod assist   Supine to sit: Mod assist;+2 for physical assistance      General bed mobility comments: log roll to R; use of guidance for hands due to weakness and pain. Low back pain, Assist for trunk elevation and BLE movement.    Transfers Overall transfer level: Needs assistance Equipment used: 1 person hand held assist;2 person hand held assist Transfers: Sit to/from [facility] Sit to Stand: Mod assist;+2 physical assistance Stand pivot transfers: Mod assist;+2 physical assistance       General transfer comment: modA +2 with hand held assist for transfer bed <-> [facility] and sit to stand x1 from bed and from [facility]. Pt taking steps to recliner with modA +2. Pt's RUE elevated above her heart on blankets in recliner.    Balance Overall balance assessment: Needs assistance Sitting-balance support: Feet supported;No upper extremity supported Sitting balance-Leahy Scale: Fair     Standing balance support: Bilateral upper extremity supported;During functional activity Standing balance-Leahy Scale: Poor Standing balance comment: pt requires outside support to steady                           ADL either performed or assessed with clinical judgement   ADL Overall ADL's : Needs assistance/impaired Eating/Feeding: Maximal assistance Eating/Feeding Details (indicate cue type and reason): due to weakness in hands, recent R wrist cortizone shot leaving her hand numb and L ring finger fracture Grooming: Maximal assistance;Sitting   Upper Body Bathing: Maximal assistance;Sitting   Lower Body Bathing: Total assistance;Sitting/lateral leans;Sit to/from stand   Upper Body Dressing : Maximal assistance;Sitting;Standing   Lower Body Dressing: Total assistance;Sitting/lateral  leans;Sit to/from stand;Cueing for safety   Toilet Transfer: Moderate assistance;+2 for physical assistance;+2 for safety/equipment;Stand-pivot;Cueing for safety Toilet Transfer Details (indicate cue type and reason): +2 hand held assist (gripping at lebows due to  deficits) Toileting- Clothing Manipulation and Hygiene: Total assistance;Sitting/lateral lean;Sit to/from stand Toileting - Clothing Manipulation Details (indicate cue type and reason): use of  purewick; sitting on commode, unable to assist with pericare     Functional mobility during ADLs: Moderate assistance;+2 for physical assistance;+2 for safety/equipment;Cueing for safety;Cueing for sequencing General ADL Comments: Pt limited by decreased strength, decreased ability to care for self and decreased activity tolerance. Pt with supportive spouse, but physically he cannot assist pt very much due to his own ailments.     Vision Baseline Vision/History: No visual deficits Patient Visual Report: No change from baseline Vision Assessment?: No apparent visual deficits     Perception     Praxis      Pertinent Vitals/Pain Pain Assessment: Faces Faces Pain Scale: Hurts even more Pain Location: R wrist, L ring finger, L bruised ribs, low back Pain Descriptors / Indicators: Grimacing;Guarding Pain Intervention(s): Limited activity within patient's tolerance;Monitored during session;Premedicated before session;Repositioned     Hand Dominance Left   Extremity/Trunk Assessment Upper Extremity Assessment Upper Extremity Assessment: Generalized weakness;RUE deficits/detail;LUE deficits/detail RUE Deficits / Details: R carpel tunnel symptoms, cortizone shot numbness, 3 weeks ago, R wrist/hand swelling since hospitalization, hx of rotator cuff injury; very little ROM RUE Coordination: decreased fine motor;decreased gross motor LUE Deficits / Details: L ring finger fx, splinted, hx of rotator cuff injury LUE Coordination: decreased fine motor;decreased gross motor   Lower Extremity Assessment Lower Extremity Assessment: Generalized weakness;RLE deficits/detail;LLE deficits/detail;Defer to PT evaluation RLE Deficits / Details: weakness, + edema RLE Sensation: WNL LLE Deficits / Details: hx of L  knee dislocation, residual nerve damage reports impair sensation but does not exhibit on exam   Cervical / Trunk Assessment Cervical / Trunk Assessment: Other exceptions (L rib bruise from fall) Cervical / Trunk Exceptions: L3-L4 herniated discs   Communication Communication Communication: HOH;Other (comment) (wears hearing aides)   Cognition Arousal/Alertness: Awake/alert Behavior During Therapy: Flat affect Overall Cognitive Status: Impaired/Different from baseline Area of Impairment: Following commands;Problem solving;Memory                     Memory: Decreased short-term memory Following Commands: Follows one step commands with increased time;Follows multi-step commands inconsistently;Follows multi-step commands with increased time     Problem Solving: Requires verbal cues;Requires tactile cues;Difficulty sequencing General Comments: Pt answering some PLOF questions, but not all. Pt able to answer questions with increased time. (spouse answering most as he was eager to get the story straight)   General Comments  Spouse present throughout session, MD present requesting R UE elevation and review of IS during session. O2 on RA >90% throughout exertion, 70-90 BPM throughout session.    Exercises Exercises: Other exercises Other Exercises Other Exercises: IS x5, max inhalation 800 mL   Shoulder Instructions      Home Living Family/patient expects to be discharged to:: Private residence Living Arrangements: Spouse/significant other Available Help at Discharge: Family;Available 24 hours/day Type of Home: Apartment (independent living at [facility]) Home Access: Level entry;Elevator     Home Layout: One level     Bathroom Shower/Tub: Occupational psychologist: Standard Bathroom Accessibility: Yes   Home Equipment: Shower seat;Grab bars - toilet;Grab bars - tub/shower;Hand held Tourist information centre manager - 4 wheels;Wheelchair - manual;Other (comment) (sleep  number bed)   Additional Comments: husband assisted with PLOF, and home set up      Prior Functioning/Environment Level of Independence: Needs assistance  Gait / Transfers Assistance Needed: ambulates with Rollator ADL's / Homemaking Assistance Needed: independent with bathing and dressing, and            OT Problem List: Decreased strength;Decreased range of motion;Decreased activity tolerance;Impaired balance (sitting and/or standing);Decreased cognition;Decreased coordination;Decreased safety awareness;Cardiopulmonary status limiting activity;Obesity;Pain;Increased edema;Impaired UE functional use;Decreased knowledge of use of DME or AE;Impaired sensation      OT Treatment/Interventions: Self-care/ADL training;Therapeutic exercise;DME and/or AE instruction;Therapeutic activities;Patient/family education;Balance training    OT Goals(Current goals can be found in the care plan section) Acute Rehab OT Goals Patient Stated Goal: feel better OT Goal Formulation: With patient Time For Goal Achievement: 08/31/20 Potential to Achieve Goals: Good ADL Goals Pt Will Perform Eating: with min assist;with adaptive utensils;sitting Pt Will Perform Grooming: with min assist;sitting Pt Will Transfer to Toilet: with min assist;stand pivot transfer;bedside commode Pt/caregiver will Perform Home Exercise Program: Increased ROM;Both right and left upper extremity;With minimal assist;With written HEP provided Additional ADL Goal #1: Pt will perform bed mobility with minA in order to increase independence for OOB ADL. Additional ADL Goal #2: Pt will follow 1-2 step commands with minimal cues to attend to task.  OT Frequency: Min 2X/week   Barriers to D/C: Decreased caregiver support  pt's spouse unable to physically assist pt       Co-evaluation PT/OT/SLP Co-Evaluation/Treatment: Yes Reason for Co-Treatment: Complexity of the patient's impairments (multi-system involvement) PT goals addressed  during session: Mobility/safety with mobility OT goals addressed during session: ADL's and self-care;Strengthening/ROM      AM-PAC OT "6 Clicks" Daily Activity     Outcome Measure Help from another person eating meals?: A Lot Help from another person taking care of personal grooming?: A Lot Help from another person toileting, which includes using toliet, bedpan, or urinal?: Total Help from another person bathing (including washing, rinsing, drying)?: A Lot Help from another person to put on and taking off regular upper body clothing?: A Lot Help from another person to put on and taking off regular lower body clothing?: A Lot 6 Click Score: 11   End of Session Equipment Utilized During Treatment: Gait belt;Rolling walker Nurse Communication: Mobility status  Activity Tolerance: Patient tolerated treatment well Patient left: in chair;with call bell/phone within reach;with chair alarm set;with family/visitor present  OT Visit Diagnosis: Unsteadiness on feet (R26.81);Muscle weakness (generalized) (M62.81);Pain;Other symptoms and signs involving cognitive function Pain - Right/Left: Right (R wrist and L finger; back) Pain - part of body: Hand                Time: [id number] OT Time Calculation (min): 61 min Charges:  OT General Charges $OT Visit: 1 Visit OT Evaluation $OT Eval Moderate Complexity: 1 Mod OT Treatments $Self Care/Home Management : 8-22 mins  Jefferey Pica, OTR/L Acute Rehabilitation Services Pager: [phone] Office: [place] 08/17/2020, 3:43 PM

## 2020-08-17 NOTE — Progress Notes (Signed)
PROGRESS NOTE                                                                             PROGRESS NOTE                                                                                                                                                                                                             Patient Demographics:    Ruth Riley, is a 75 y.o. female, DOB - Aug 27, 1945, [id number]  Outpatient Primary MD for the patient is [name], MD    LOS - 0  Admit date - 08/16/2020    No chief complaint on file.      Brief Narrative     Ruth Riley is a 75 y.o. female with medical history significant of DM; HTN; and h/o renal transplant with current stage 4 CKD (baseline 3.7) with anemia (baseline 9) presenting to [facility] with confusion.  When asked what brought her to the hospital, the patient repeatedly returns to her litany of orthopedic complaints.  Most recently, she did fall and injure her R wrist and then fell again and injured her L hand.  She acknowledges some SOB and cough that appear to have started in the last week; she was feeling better after her COVID infection in early February.  Notes from [facility] indicate that the patient has had intermittent confusion and persistent lethargy with fever to 101.  She was recently started on oxycodone and prednisone and this was thought to be a possible cause for her AMS.   -Work-up in [facility] hospital showing her Hemoglobin dropped from 9 to 7.5 with stool for occult blood positive. Patient's stool was dark but clearly melanotic. No Gastroenterologist at [place] this week so requested transfer.      Subjective:    Ruth Riley today reports no bowel movement overnight, still complaining [place] right hand pain, tingling or numbness.   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   Multifocal PNA with h/o COVID-19 infection -Patient presenting with productive cough, mildly decreased oxygen saturation. -Chest x-ray and wrangle confirming subtle bibasilar opacities  -She was encouraged with incentive spirometry, flutter valve . -Continue with IV azithromycin and Rocephin . -She was encouraged to use incentive spirometry and flutter valve . -Steroid-dependent, continue with stress dose steroids.  Decrease dosing to every 12 hours     Anemia, possible GI bleed -Hemoglobin this morning 7.1, down from 8.5, will transfuse 1 unit PRBC . -Input greatly appreciated, plan for EGD/colonoscopy when she is more stable .  Stage 4 CKD, h/o renal transplant -Continue tacrolimus, change prednisone to stress dosed steroids -Continue Bicarb -Replete with IVF -Continue Bactrim 3x/week  DM -Continue Lantus -will cover with sensitive-scale SSI  HTN -Continue Coreg  HLD -Continue Crestor  Wrist pain -She has had recent falls and has B wrist pain -L 4th finger broken but splint is undone; will request ortho tech to replace splint -R hand appears to be more c/w carpal tunnel or nerve impingement.   -   Continue with nocturnal splinting -We will obtain MRI of wrist and hand without contrast  Back pain -I have reviewed this patient in the  Controlled Substances Reporting System.  Se  is receiving medications from multiple providers but appears to be not be receiving regular prescriptions. -She is not at particularly high risk of opioid misuse, diversion, or overdose. -Continue Oxy IR prn -Continue Requip    SpO2: 91 %  Recent Labs  Lab 08/16/20 1556 08/17/20 0109  WBC 5.4 4.4  PLT 134* 125*  PROCALCITON 1.10  --   AST 15  --   ALT 16  --   ALKPHOS 70  --   BILITOT 1.0  --   ALBUMIN 2.6*  --        ABG  No results found for: PHART, PCO2ART, PO2ART, HCO3, TCO2, ACIDBASEDEF, O2SAT        Condition - Extremely Guarded  Family Communication  :  Husband at bedside  Code Status :  DNR  Consults  :  none  Disposition Plan  :    Status is: Inpatient  Remains inpatient appropriate because:IV treatments appropriate due to intensity of illness or inability to take PO   Dispo: The patient is from: Home              Anticipated d/c is to: Home              Patient currently is not medically stable to d/c.   Difficult to place patient No      DVT Prophylaxis  : SCDs   Lab Results  Component Value Date   PLT 125 (L) 08/17/2020    Diet :  Diet Order            Diet Carb Modified Fluid consistency:  Thin; Room service appropriate? Yes  Diet effective now                  Inpatient Medications  Scheduled Meds: . carvedilol  25 mg Oral BID  . hydrocortisone sod succinate (SOLU-CORTEF) inj  50 mg Intravenous Q6H  . insulin aspart  0-9 Units Subcutaneous TID WC  . insulin glargine  10 Units Subcutaneous QHS  . pantoprazole  40 mg Oral BID  . rOPINIRole  3 mg Oral QHS  . rosuvastatin  20 mg Oral Daily  . sodium bicarbonate  1,300 mg Oral BID  . sulfamethoxazole-trimethoprim  1 tablet Oral Once per day on Mon Wed Fri  . tacrolimus  1 mg Oral TID   Continuous Infusions: . sodium chloride 50 mL/hr at 08/17/20 0818  . azithromycin 500 mg (08/17/20 1337)  . cefTRIAXone (ROCEPHIN)  IV 2 g (08/17/20 0843)   PRN Meds:.acetaminophen  **OR** acetaminophen, albuterol, calcium carbonate (dosed in mg elemental calcium), camphor-menthol **AND** hydrOXYzine, docusate sodium, feeding supplement (NEPRO CARB STEADY), guaiFENesin, hydrALAZINE, HYDROcodone-acetaminophen, ondansetron **OR** ondansetron (ZOFRAN) IV, oxyCODONE, sorbitol, zolpidem  Antibiotics  :    Anti-infectives (From admission, onward)   Start     Dose/Rate Route Frequency Ordered Stop   08/17/20 0900  sulfamethoxazole-trimethoprim (BACTRIM) 400-80 MG per tablet 1 tablet        1 tablet Oral Once per day on Mon Wed Fri 08/16/20 1430     08/17/20 0800  cefTRIAXone (ROCEPHIN) 2 g in sodium chloride 0.9 % 100 mL IVPB        2 g 200 mL/hr over 30 Minutes Intravenous Every 24 hours 08/16/20 1430 08/21/20 0759   08/17/20 0800  azithromycin (ZITHROMAX) 500 mg in sodium chloride 0.9 % 250 mL IVPB        500 mg 250 mL/hr over 60 Minutes Intravenous Every 24 hours 08/16/20 1430 08/21/20 0759         Emeline Gins Riker Collier M.D on 08/17/2020 at 3:11 PM  To page go to [email or web address]   [facility] -  Office  [phone]     Objective:   Vitals:   08/17/20 1245 08/17/20 1415 08/17/20 1430 08/17/20 1456  BP: 102/71 125/78 110/70 103/67  Pulse: 67 73 70 71  Resp: '11 18 19 20  '$ Temp: 97.7 F (36.5 C) 97.7 F (36.5 C) 97.7 F (36.5 C) 97.6 F (36.4 C)  TempSrc: Oral Oral Oral Oral  SpO2: 95% 95% 92% 91%  Weight:      Height:        Wt Readings from Last 3 Encounters:  08/16/20 57.6 kg  08/08/20 56.7 kg  05/26/20 56.8 kg     Intake/Output Summary (Last 24 hours) at 08/17/2020 1511 Last data filed at 08/17/2020 1500 Gross per 24 hour  Intake 1686.15 ml  Output 500 ml  Net 1186.15 ml     Physical Exam  Awake Alert, frail, significantly deconditioned, frail, no new F.N deficits, Normal affect Symmetrical Chest wall movement, but is at the bases, no wheezing RRR,No Gallops,Rubs or new Murmurs, No Parasternal Heave +ve B.Sounds, Abd Soft, No tenderness,   No rebound - guarding or rigidity. No Cyanosis, Clubbing or edema, No new Rash or bruise  , she is having hands deformities secondary to edema, with right hand edema.    Data Review:    CBC Recent Labs  Lab 08/16/20 1556 08/17/20 0109  WBC 5.4 4.4  HGB 8.5* 7.1*  HCT 26.5* 22.2*  PLT 134* 125*  MCV 93.3  93.3  MCH 29.9 29.8  MCHC 32.1 32.0  RDW 16.5* 16.5*  LYMPHSABS 0.5*  --   MONOABS 0.2  --   EOSABS 0.1  --   BASOSABS 0.0  --     Recent Labs  Lab 08/16/20 1556 08/17/20 0109  NA 135 133*  K 4.8 4.9  CL 99 100  CO2 27 26  GLUCOSE 104* 200*  BUN 56* 56*  CREATININE 3.77* 3.54*  CALCIUM 8.9 7.9*  AST 15  --   ALT 16  --   ALKPHOS 70  --   BILITOT 1.0  --   ALBUMIN 2.6*  --   PROCALCITON 1.10  --   TSH 2.903  --     ------------------------------------------------------------------------------------------------------------------ No results for input(s): CHOL, HDL, LDLCALC, TRIG, CHOLHDL, LDLDIRECT in the last 72 hours.  No results found for: HGBA1C ------------------------------------------------------------------------------------------------------------------ Recent Labs    08/16/20 1556  TSH 2.903    Cardiac Enzymes No results for input(s): CKMB, TROPONINI, MYOGLOBIN in the last 168 hours.  Invalid input(s): CK ------------------------------------------------------------------------------------------------------------------ No results found for: BNP  Micro Results No results found for this or any previous visit (from the past 240 hour(s)).  Radiology Reports DG Chest Port 1 View  Result Date: 08/17/2020 CLINICAL DATA:  Dyspnea. EXAM: PORTABLE CHEST 1 VIEW COMPARISON:  August 15, 2020. FINDINGS: Stable cardiomediastinal silhouette. No pneumothorax or pleural effusion is noted. No consolidative process is noted. Bony thorax is unremarkable. IMPRESSION: No active disease. Electronically Signed   By: Marijo Conception M.D.   On: 08/17/2020 08:46

## 2020-08-17 NOTE — Progress Notes (Addendum)
Orthopedic Tech Progress Note Patient Details:  Ruth Riley January 21, 1946 [id number] Patient already had wrist brace for right hand. I reapplied new finger splint for L 4th finger Patient ID: Ruth Riley, female   DOB: 1945/10/28, 75 y.o.   MRN: [id number]   Chip Boer 08/17/2020, 12:56 PM

## 2020-08-17 NOTE — Progress Notes (Addendum)
Daily Rounding Note  08/17/2020, 12:19 PM  LOS: 0 days   SUBJECTIVE:   Chief complaint:   Acute on chronic anemia. No problems with full liquid diet.  Denies abdominal pain, nausea, vomiting.  Still feels somewhat tired.  Getting her first unit of blood right now. No bowel movement yesterday or today.  OBJECTIVE:         Vital signs in last 24 hours:    Temp:  [97.7 F (36.5 C)-98.2 F (36.8 C)] 97.7 F (36.5 C) (04/06 1145) Pulse Rate:  [70-86] 70 (04/06 1145) Resp:  [14-19] 15 (04/06 1145) BP: (78-185)/(49-99) 78/50 (04/06 1145) SpO2:  [88 %-92 %] 92 % (04/06 1145) Weight:  [57.6 kg] 57.6 kg (04/05 1328)   Filed Weights   08/16/20 1328  Weight: 57.6 kg   General: Looks ill, pale, chronically unwell. Heart: RRR. Chest: Crackles in the bases.  No cough.  No dyspnea at rest. Abdomen: Soft without tenderness.  Active bowel sounds. Extremitiies: Swelling in the right wrist. Neuro/Psych: Alert.  Appropriate.  No tremors.  Oriented x3.  Intake/Output from previous day: 04/05 0701 - 04/06 0700 In: 479.5 [I.V.:479.5] Out: 500 [Urine:500]  Intake/Output this shift: Total I/O In: 120 [P.O.:120] Out: -   Lab Results: Recent Labs    08/16/20 1556 08/17/20 0109  WBC 5.4 4.4  HGB 8.5* 7.1*  HCT 26.5* 22.2*  PLT 134* 125*   BMET Recent Labs    08/16/20 1556 08/17/20 0109  NA 135 133*  K 4.8 4.9  CL 99 100  CO2 27 26  GLUCOSE 104* 200*  BUN 56* 56*  CREATININE 3.77* 3.54*  CALCIUM 8.9 7.9*   LFT Recent Labs    08/16/20 1556  PROT 6.0*  ALBUMIN 2.6*  AST 15  ALT 16  ALKPHOS 70  BILITOT 1.0   PT/INR No results for input(s): LABPROT, INR in the last 72 hours. Hepatitis Panel No results for input(s): HEPBSAG, HCVAB, HEPAIGM, HEPBIGM in the last 72 hours.  Studies/Results: DG Chest Port 1 View  Result Date: 08/17/2020 CLINICAL DATA:  Dyspnea. EXAM: PORTABLE CHEST 1 VIEW COMPARISON:  August 15, 2020. FINDINGS: Stable cardiomediastinal silhouette. No pneumothorax or pleural effusion is noted. No consolidative process is noted. Bony thorax is unremarkable. IMPRESSION: No active disease. Electronically Signed   By: Marijo Conception M.D.   On: 08/17/2020 08:46   Scheduled Meds: . carvedilol  25 mg Oral BID  . hydrocortisone sod succinate (SOLU-CORTEF) inj  50 mg Intravenous Q6H  . insulin aspart  0-9 Units Subcutaneous TID WC  . insulin glargine  10 Units Subcutaneous QHS  . pantoprazole  40 mg Oral BID  . rOPINIRole  3 mg Oral QHS  . rosuvastatin  20 mg Oral Daily  . sodium bicarbonate  1,300 mg Oral BID  . sulfamethoxazole-trimethoprim  1 tablet Oral Once per day on Mon Wed Fri  . tacrolimus  1 mg Oral TID   Continuous Infusions: . sodium chloride 50 mL/hr at 08/17/20 0818  . azithromycin    . cefTRIAXone (ROCEPHIN)  IV 2 g (08/17/20 0843)   PRN Meds:.acetaminophen **OR** acetaminophen, albuterol, calcium carbonate (dosed in mg elemental calcium), camphor-menthol **AND** hydrOXYzine, docusate sodium, feeding supplement (NEPRO CARB STEADY), guaiFENesin, hydrALAZINE, HYDROcodone-acetaminophen, ondansetron **OR** ondansetron (ZOFRAN) IV, oxyCODONE, sorbitol, zolpidem   ASSESMENT:   *    Acute on chronic anemia.  Baseline chronic renal disease anemia. Periodic Retacrit doses for anemia of chronic kidney  disease.  Last received Retacrit 8 days ago.  Transfusions in past years but not within the last 12 months. On oral B12 at home, no po iron at home. Hgb 8.5 >> 7.1, 1 PRBC today.    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 The only endoscopic study of record was an EGD in 04/2017 revealing Candida esophagitis (pathology report found but operative report not found).   Remote colonoscopy > 5 years ago pt reports as unremarkable.   GERD w sxs controlled on bid Pantoprazole.   Pt reports periodic epistaxis but none for about a month.  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 Immunosuppressive drugs include tacrolimus, prednisone,  *    Bilateral pneumonia, CAP.  Patient hospitalized briefly in early February 2022 with breakthrough Covid pneumonia. Zithromax, Rocephin in place  *     Carpal tunnel syndrome.  R wrist and L ring finger fracture.  Being treated with prednisone 10 mg daily (already takes 5 mg daily likely for immunosuppression), oxycodone.  *   Lethargy, AMS at home PTA. ? Due to oxycodone.  Improved      PLAN   *   Plan EGD with possible colonoscopy as well prior to discharge.  GI moving to standby, please alert GI when she is closer to discharge and her pneumonia has improved. In meantime continue Protonix 40 mg po bid.   advancing to carb modified diet.    Azucena Freed  08/17/2020, 12:19 PM Phone [phone]

## 2020-08-17 NOTE — Progress Notes (Signed)
CSW received SNF consult. Patient resides with her husband at [facility]. Per [facility], they provide SNF for residents living there pending bed availability (not over weekend). CSW attempted to call patient's spouse and will try again tomorrow to see if he would like SNF placement.  Orlin Kann LCSW

## 2020-08-18 ENCOUNTER — Other Ambulatory Visit (HOSPITAL_COMMUNITY): Payer: Medicare Other

## 2020-08-18 ENCOUNTER — Inpatient Hospital Stay (HOSPITAL_COMMUNITY): Payer: Medicare Other

## 2020-08-18 DIAGNOSIS — K922 Gastrointestinal hemorrhage, unspecified: Secondary | ICD-10-CM

## 2020-08-18 DIAGNOSIS — M25532 Pain in left wrist: Secondary | ICD-10-CM | POA: Diagnosis not present

## 2020-08-18 DIAGNOSIS — Z96653 Presence of artificial knee joint, bilateral: Secondary | ICD-10-CM | POA: Diagnosis not present

## 2020-08-18 DIAGNOSIS — B49 Unspecified mycosis: Secondary | ICD-10-CM | POA: Diagnosis not present

## 2020-08-18 DIAGNOSIS — B379 Candidiasis, unspecified: Secondary | ICD-10-CM

## 2020-08-18 DIAGNOSIS — G9341 Metabolic encephalopathy: Secondary | ICD-10-CM | POA: Diagnosis not present

## 2020-08-18 DIAGNOSIS — S62605A Fracture of unspecified phalanx of left ring finger, initial encounter for closed fracture: Secondary | ICD-10-CM

## 2020-08-18 DIAGNOSIS — M25531 Pain in right wrist: Secondary | ICD-10-CM | POA: Diagnosis not present

## 2020-08-18 DIAGNOSIS — W19XXXA Unspecified fall, initial encounter: Secondary | ICD-10-CM

## 2020-08-18 LAB — BLOOD CULTURE ID PANEL (REFLEXED) - BCID2

## 2020-08-18 LAB — CBC
HCT: 24.2 % — ABNORMAL LOW (ref 36.0–46.0)
Hemoglobin: 8.1 g/dL — ABNORMAL LOW (ref 12.0–15.0)
MCH: 29 pg (ref 26.0–34.0)
MCHC: 33.5 g/dL (ref 30.0–36.0)
MCV: 86.7 fL (ref 80.0–100.0)
Platelets: 132 10*3/uL — ABNORMAL LOW (ref 150–400)
RBC: 2.79 MIL/uL — ABNORMAL LOW (ref 3.87–5.11)
RDW: 19.7 % — ABNORMAL HIGH (ref 11.5–15.5)
WBC: 4.9 10*3/uL (ref 4.0–10.5)
nRBC: 0 % (ref 0.0–0.2)

## 2020-08-18 LAB — BPAM RBC
Blood Product Expiration Date: 202204212359
ISSUE DATE / TIME: 202204061116
Unit Type and Rh: 9500

## 2020-08-18 LAB — COMPREHENSIVE METABOLIC PANEL
ALT: 15 U/L (ref 0–44)
AST: 18 U/L (ref 15–41)
Albumin: 2 g/dL — ABNORMAL LOW (ref 3.5–5.0)
Alkaline Phosphatase: 67 U/L (ref 38–126)
Anion gap: 8 (ref 5–15)
BUN: 59 mg/dL — ABNORMAL HIGH (ref 8–23)
CO2: 23 mmol/L (ref 22–32)
Calcium: 7.7 mg/dL — ABNORMAL LOW (ref 8.9–10.3)
Chloride: 101 mmol/L (ref 98–111)
Creatinine, Ser: 3.48 mg/dL — ABNORMAL HIGH (ref 0.44–1.00)
GFR, Estimated: 13 mL/min — ABNORMAL LOW (ref >60)
Glucose, Bld: 229 mg/dL — ABNORMAL HIGH (ref 70–99)
Potassium: 4.2 mmol/L (ref 3.5–5.1)
Sodium: 132 mmol/L — ABNORMAL LOW (ref 135–145)
Total Bilirubin: 0.5 mg/dL (ref 0.3–1.2)
Total Protein: 5 g/dL — ABNORMAL LOW (ref 6.5–8.1)

## 2020-08-18 LAB — TYPE AND SCREEN
ABO/RH(D): O NEG
Antibody Screen: NEGATIVE
Unit division: 0

## 2020-08-18 LAB — GLUCOSE, CAPILLARY
Glucose-Capillary: 166 mg/dL — ABNORMAL HIGH (ref 70–99)
Glucose-Capillary: 138 mg/dL — ABNORMAL HIGH (ref 70–99)
Glucose-Capillary: 143 mg/dL — ABNORMAL HIGH (ref 70–99)
Glucose-Capillary: 141 mg/dL — ABNORMAL HIGH (ref 70–99)

## 2020-08-18 MED ORDER — SODIUM CHLORIDE 0.9 % IV SOLN
100.0000 mg | Freq: Every day | INTRAVENOUS | Status: DC
  Administered 2020-08-19 – 2020-08-24 (×6): 100 mg via INTRAVENOUS
  Filled 2020-08-18 (×7): qty 100

## 2020-08-18 MED ORDER — LORAZEPAM 2 MG/ML IJ SOLN
0.5000 mg | Freq: Once | INTRAMUSCULAR | Status: AC
  Administered 2020-08-18: 0.5 mg via INTRAVENOUS
  Filled 2020-08-18: qty 1

## 2020-08-18 MED ORDER — SODIUM CHLORIDE 0.9 % IV SOLN
200.0000 mg | Freq: Once | INTRAVENOUS | Status: AC
  Administered 2020-08-18: 200 mg via INTRAVENOUS
  Filled 2020-08-18: qty 200

## 2020-08-18 NOTE — Progress Notes (Addendum)
PHARMACY - PHYSICIAN COMMUNICATION CRITICAL VALUE ALERT - BLOOD CULTURE IDENTIFICATION (BCID)  Ruth Riley is an 75 y.o. female who presented to [facility] on 08/16/2020 with a chief complaint of AMS  Assessment:   2/2 blood cultures growing Candida albicans  Name of physician (or Provider) Contacted:  Dr. Tonie Griffith  Current antibiotics: Rocephin and Azithromycin  Changes to prescribed antibiotics recommended:   Due to drug interaction between fluconazole and tacrolimus, will start anidulafungin  Results for orders placed or performed during the hospital encounter of 08/16/20  Blood Culture ID Panel (Reflexed) (Collected: 08/16/2020  3:50 PM)  Result Value Ref Range   Enterococcus faecalis NOT DETECTED NOT DETECTED   Enterococcus Faecium NOT DETECTED NOT DETECTED   Listeria monocytogenes NOT DETECTED NOT DETECTED   Staphylococcus species NOT DETECTED NOT DETECTED   Staphylococcus aureus (BCID) NOT DETECTED NOT DETECTED   Staphylococcus epidermidis NOT DETECTED NOT DETECTED   Staphylococcus lugdunensis NOT DETECTED NOT DETECTED   Streptococcus species NOT DETECTED NOT DETECTED   Streptococcus agalactiae NOT DETECTED NOT DETECTED   Streptococcus pneumoniae NOT DETECTED NOT DETECTED   Streptococcus pyogenes NOT DETECTED NOT DETECTED   A.calcoaceticus-baumannii NOT DETECTED NOT DETECTED   Bacteroides fragilis NOT DETECTED NOT DETECTED   Enterobacterales NOT DETECTED NOT DETECTED   Enterobacter cloacae complex NOT DETECTED NOT DETECTED   Escherichia coli NOT DETECTED NOT DETECTED   Klebsiella aerogenes NOT DETECTED NOT DETECTED   Klebsiella oxytoca NOT DETECTED NOT DETECTED   Klebsiella pneumoniae NOT DETECTED NOT DETECTED   Proteus species NOT DETECTED NOT DETECTED   Salmonella species NOT DETECTED NOT DETECTED   Serratia marcescens NOT DETECTED NOT DETECTED   Haemophilus influenzae NOT DETECTED NOT DETECTED   Neisseria meningitidis NOT DETECTED NOT DETECTED    Pseudomonas aeruginosa NOT DETECTED NOT DETECTED   Stenotrophomonas maltophilia NOT DETECTED NOT DETECTED   Candida albicans DETECTED (A) NOT DETECTED   Candida auris NOT DETECTED NOT DETECTED   Candida glabrata NOT DETECTED NOT DETECTED   Candida krusei NOT DETECTED NOT DETECTED   Candida parapsilosis NOT DETECTED NOT DETECTED   Candida tropicalis NOT DETECTED NOT DETECTED   Cryptococcus neoformans/gattii NOT DETECTED NOT DETECTED    Caryl Pina 08/18/2020  2:40 AM

## 2020-08-18 NOTE — NC FL2 (Addendum)
[place] LEVEL OF CARE SCREENING TOOL     IDENTIFICATION  Patient Name: Ruth Riley Birthdate: June 01, 1945 Sex: female Admission Date (Current Location): 08/16/2020  [facility] and [place] Number:  [employer] and Address:  [facility], [place] [address], [place], [place] [address]      Provider Number: [id number]  Attending Physician Name and Address:  Albertine Patricia, MD  Relative Name and Phone Number:  [name] (spouse) [phone]    Current Level of Care: Hospital Recommended Level of Care: [place] Prior Approval Number:    Date Approved/Denied:   PASRR Number: [id number]  Discharge Plan: SNF    Current Diagnoses: Patient Active Problem List   Diagnosis Date Noted  . Blood in the stool   . Acute metabolic encephalopathy [id number]  . Bilateral wrist pain 08/16/2020  . Multifocal pneumonia 08/16/2020  . Chronic back pain 08/16/2020  . Diabetes mellitus type 2 in nonobese (HCC)   . Essential hypertension   . H/O kidney transplant   . Stage 4 chronic kidney disease ([place])   . History of COVID-19 06/18/2020  . Anemia in chronic kidney disease 02/21/2020    Orientation RESPIRATION BLADDER Height & Weight     Self,Time,Situation,Place  Normal Continent,External catheter Weight: 126 lb 14.4 oz (57.6 kg) Height:  '5\' 1"'$  (154.9 cm)  BEHAVIORAL SYMPTOMS/MOOD NEUROLOGICAL BOWEL NUTRITION STATUS      Continent Diet (See DC summary)  AMBULATORY STATUS COMMUNICATION OF NEEDS Skin   Extensive Assist Verbally Normal                       Personal Care Assistance Level of Assistance  Bathing,Feeding,Dressing Bathing Assistance: Maximum assistance Feeding assistance: Independent Dressing Assistance: Maximum assistance     Functional Limitations Info  Sight,Hearing,Speech Sight Info: Adequate Hearing Info: Impaired (Has a hearing aid) Speech Info: Adequate    SPECIAL CARE FACTORS  FREQUENCY  PT (By licensed PT),OT (By licensed OT)     PT Frequency: 5X per week OT Frequency: 5X per week            Contractures      Additional Factors Info  Code Status,Allergies,Psychotropic,Insulin Sliding Scale Code Status Info: DNR Allergies Info: Penicillins, Tramadol, Donepezil, Amlodipine Psychotropic Info: Zoloft Insulin Sliding Scale Info: See DC summary       Current Medications (08/18/2020):  This is the current hospital active medication list Current Facility-Administered Medications  Medication Dose Route Frequency Provider Last Rate Last Admin  . 0.9 %  sodium chloride infusion   Intravenous Continuous Elgergawy, Silver Huguenin, MD 50 mL/hr at 08/18/20 0350 New Bag at 08/18/20 0350  . acetaminophen (TYLENOL) tablet 650 mg  650 mg Oral Q6H PRN Karmen Bongo, MD       Or  . acetaminophen (TYLENOL) suppository 650 mg  650 mg Rectal Q6H PRN Karmen Bongo, MD      . albuterol (PROVENTIL) (2.5 MG/3ML) 0.083% nebulizer solution 2.5 mg  2.5 mg Nebulization Q2H PRN Karmen Bongo, MD      . Derrill Memo ON 08/19/2020] anidulafungin (ERAXIS) 100 mg in sodium chloride 0.9 % 100 mL IVPB  100 mg Intravenous Daily Elgergawy, Silver Huguenin, MD      . azithromycin (ZITHROMAX) 500 mg in sodium chloride 0.9 % 250 mL IVPB  500 mg Intravenous Q24H Karmen Bongo, MD 250 mL/hr at 08/18/20 0851 500 mg at 08/18/20 0851  . calcium carbonate (dosed in mg elemental calcium) suspension 500  mg of elemental calcium  500 mg of elemental calcium Oral Q6H PRN Karmen Bongo, MD      . camphor-menthol [facility]) lotion 1 application  1 application Topical [id number] PRN Karmen Bongo, MD       And  . hydrOXYzine (ATARAX/VISTARIL) tablet 25 mg  25 mg Oral Q8H PRN Karmen Bongo, MD      . carvedilol (COREG) tablet 25 mg  25 mg Oral BID Karmen Bongo, MD   25 mg at 08/18/20 0755  . cefTRIAXone (ROCEPHIN) 2 g in sodium chloride 0.9 % 100 mL IVPB  2 g Intravenous Q24H Karmen Bongo, MD 200 mL/hr at 08/18/20 0749 2  g at 08/18/20 0749  . docusate sodium (ENEMEEZ) enema 283 mg  1 enema Rectal PRN Karmen Bongo, MD      . feeding supplement (NEPRO CARB STEADY) liquid 237 mL  237 mL Oral TID PRN Karmen Bongo, MD      . guaiFENesin [facility]) 12 hr tablet 600 mg  600 mg Oral BID PRN Karmen Bongo, MD      . hydrALAZINE (APRESOLINE) injection 5 mg  5 mg Intravenous Q4H PRN Karmen Bongo, MD   5 mg at 08/18/20 0546  . HYDROcodone-acetaminophen (NORCO/VICODIN) 5-325 MG per tablet 1-2 tablet  1-2 tablet Oral Q4H PRN Karmen Bongo, MD   2 tablet at 08/18/20 0754  . hydrocortisone sodium succinate (SOLU-CORTEF) 100 MG injection 50 mg  50 mg Intravenous Q12H Elgergawy, Silver Huguenin, MD   50 mg at 08/18/20 0751  . insulin aspart (novoLOG) injection 0-9 Units  0-9 Units Subcutaneous TID WC Karmen Bongo, MD   1 Units at 08/18/20 1240  . insulin glargine (LANTUS) injection 10 Units  10 Units Subcutaneous QHS Karmen Bongo, MD   10 Units at 08/17/20 2108  . ondansetron (ZOFRAN) tablet 4 mg  4 mg Oral Q6H PRN Karmen Bongo, MD       Or  . ondansetron [facility]) injection 4 mg  4 mg Intravenous Q6H PRN Karmen Bongo, MD   4 mg at 08/17/20 2010  . oxyCODONE (Oxy IR/ROXICODONE) immediate release tablet 5 mg  5 mg Oral Q4H PRN Karmen Bongo, MD   5 mg at 08/18/20 1116  . pantoprazole (PROTONIX) EC tablet 40 mg  40 mg Oral BID Karmen Bongo, MD   40 mg at 08/18/20 0755  . rOPINIRole (REQUIP) tablet 3 mg  3 mg Oral QHS Karmen Bongo, MD   3 mg at 08/17/20 2107  . rosuvastatin (CRESTOR) tablet 20 mg  20 mg Oral Daily Karmen Bongo, MD   20 mg at 08/18/20 0755  . sertraline (ZOLOFT) tablet 100 mg  100 mg Oral QHS Chotiner, Yevonne Aline, MD   100 mg at 08/17/20 2207  . sodium bicarbonate tablet 1,300 mg  1,300 mg Oral BID Karmen Bongo, MD   1,300 mg at 08/18/20 0755  . sorbitol 70 % solution 30 mL  30 mL Oral PRN Karmen Bongo, MD      . sulfamethoxazole-trimethoprim (BACTRIM) 400-80 MG per tablet 1 tablet  1  tablet Oral Once per day on Mon Wed Fri Yates, Jennifer, MD   1 tablet at 08/17/20 [phone]  . tacrolimus (PROGRAF) capsule 1 mg  1 mg Oral TID Elgergawy, Silver Huguenin, MD   1 mg at 08/18/20 0757  . zolpidem (AMBIEN) tablet 5 mg  5 mg Oral QHS PRN Karmen Bongo, MD         Discharge Medications: Please see discharge summary for a list of discharge  medications.  Relevant Imaging Results:  Relevant Lab Results:   Additional Information SSN: [id number]  Glennon Hamilton, Student-Social Work

## 2020-08-18 NOTE — Plan of Care (Signed)
  Problem: Education: Goal: Knowledge of General Education information will improve Description: Including pain rating scale, medication(s)/side effects and non-pharmacologic comfort measures Outcome: Progressing   Problem: Health Behavior/Discharge Planning: Goal: Ability to manage health-related needs will improve Outcome: Progressing   Problem: Clinical Measurements: Goal: Ability to maintain clinical measurements within normal limits will improve Outcome: Progressing Goal: Will remain free from infection Outcome: Progressing Goal: Diagnostic test results will improve Outcome: Progressing Goal: Respiratory complications will improve Outcome: Progressing Goal: Cardiovascular complication will be avoided Outcome: Progressing   Problem: Activity: Goal: Risk for activity intolerance will decrease Outcome: Progressing   Problem: Nutrition: Goal: Adequate nutrition will be maintained Outcome: Progressing   Problem: Coping: Goal: Level of anxiety will decrease Outcome: Progressing   Problem: Elimination: Goal: Will not experience complications related to bowel motility Outcome: Progressing Goal: Will not experience complications related to urinary retention Outcome: Progressing   Problem: Safety: Goal: Ability to remain free from injury will improve Outcome: Progressing   Problem: Pain Managment: Goal: General experience of comfort will improve Outcome: Progressing   Problem: Skin Integrity: Goal: Risk for impaired skin integrity will decrease Outcome: Progressing   Problem: Education: Goal: Ability to identify signs and symptoms of gastrointestinal bleeding will improve Outcome: Progressing   Problem: Bowel/Gastric: Goal: Will show no signs and symptoms of gastrointestinal bleeding Outcome: Progressing   Problem: Fluid Volume: Goal: Will show no signs and symptoms of excessive bleeding Outcome: Progressing   Problem: Clinical Measurements: Goal:  Complications related to the disease process, condition or treatment will be avoided or minimized Outcome: Progressing

## 2020-08-18 NOTE — Consult Note (Incomplete Revision)
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  TTE Ophthalmology evaluation  Fu MRI rt wrist and MRI RT hand +/- Ortho consult  Continue Bactrim ppx GI bleed management per GI and Primary   Rest of the management as per the primary team. Please call with questions or concerns.  Thank you for the  consult _________________________________________________________________________________________________________ HPI and Hospital Course: 75 Y O female with PMH of DM, HTN, h/o renal transplant with current stage 4 CKD, COVID in February 5 who presented to the ED from [facility] where she initially presented with confusion, lethargy and fever. At [facility], she was told to have PNA and started on antibiotics. She was transferred to [facility] for higher level of care due to concerns for GI Bleed.  She reports fracture in her rt wrist and left ring finger after falling down. She also seems to reportedly have SOB and cough last week prior to presentation. Her reported h/o confusion on presentation seems to be improved. She is able to tell me decent history. She says she was supposed to have an MRI of her rt wrist and left hand as recommended by OP Ortho and still has pain and tenderness over those areas. She has been seen by Ortho and GI already.   Does not have central lines, but has h/o bilateral knee replacement with no issues currently   At ED, she was afebrile, no leukocytosis,  hemodynamically stable. Blood cx positive for yeast   ROS: General- Denies fever, chills, loss of appetite and loss of weight HEENT - Denies headache, blurry vision, neck pain, sinus pain Chest - Denies any chest pain, SOB or cough CVS- Denies any dizziness/lightheadedness, syncopal attacks, palpitations Abdomen- Denies any nausea, vomiting, abdominal pain and diarrhea Neuro - Denies any numbness, tingling sensation, GENERALISED WEAKNESS Psych - Denies any changes in mood irritability or depressive symptoms GU- Denies any burning, dysuria, hematuria or increased frequency of urination Skin - denies any rashes/lesions MSK - denies any joint pain/swelling or restricted ROM   Past Medical History:  Diagnosis Date  . Diabetes mellitus type 2 in nonobese (HCC)   . Essential hypertension   . H/O kidney transplant   . Stage 4 chronic kidney  disease (HCC)      Scheduled Meds: . carvedilol  25 mg Oral BID  . hydrocortisone sod succinate (SOLU-CORTEF) inj  50 mg Intravenous Q12H  . insulin aspart  0-9 Units Subcutaneous TID WC  . insulin glargine  10 Units Subcutaneous QHS  . pantoprazole  40 mg Oral BID  . rOPINIRole  3 mg Oral QHS  . rosuvastatin  20 mg Oral Daily  . sertraline  100 mg Oral QHS  . sodium bicarbonate  1,300 mg Oral BID  . sulfamethoxazole-trimethoprim  1 tablet Oral Once per day on Mon Wed Fri  . tacrolimus  1 mg Oral TID   Continuous Infusions: . sodium chloride 50 mL/hr at 08/18/20 0350  . [START ON 08/19/2020] anidulafungin    . azithromycin 500 mg (08/17/20 1337)  . cefTRIAXone (ROCEPHIN)  IV 2 g (08/17/20 0843)   PRN Meds:.acetaminophen **OR** acetaminophen, albuterol, calcium carbonate (dosed in mg elemental calcium), camphor-menthol **AND** hydrOXYzine, docusate sodium, feeding supplement (NEPRO CARB STEADY), guaiFENesin, hydrALAZINE, HYDROcodone-acetaminophen, ondansetron **OR** ondansetron (ZOFRAN) IV, oxyCODONE, sorbitol, zolpidem  Allergies  Allergen Reactions  . Penicillins Hives and Other (See Comments)  . Tramadol Other (See Comments) and Palpitations    Patient reports it makes her feel spaced out Patient reports it makes her feel spaced out Patient reports it makes her feel spaced out Patient reports it makes her feel spaced out   . Donepezil Diarrhea and Other (See Comments)  . Amlodipine Swelling    dizziness   Social History   Socioeconomic History  . Marital status: Married    Spouse name: Not on file  . Number of children: Not on file  . Years of education: Not on file  . Highest education level: Not on file  Occupational History  . Not on file  Tobacco Use  . Smoking status: Never Smoker  . Smokeless tobacco: Never Used  Substance and Sexual Activity  . Alcohol use: Not on file  . Drug use: Not on file  . Sexual activity: Not on file  Other Topics Concern  . Not  on file  Social History Narrative  . Not on file   Social Determinants of Health   Financial Resource Strain: Not on file  Food Insecurity: Not on file  Transportation Needs: Not on file  Physical Activity: Not on file  Stress: Not on file  Social Connections: Not on file  Intimate Partner Violence: Not on file      Vitals BP 134/71   Pulse 74   Temp 97.7 F (36.5 C) (Oral)   Resp 16   Ht '5\' 1"'$  (1.549 m)   Wt 57.6 kg   SpO2 97%   BMI 23.98 kg/m  Physical Exam Elderly, frail female lying in bed, awake, alert and oriented Pale conjunctiva No  Thrush Chest - rhonchi+ CVS- normal heart sounds, no murmur Abdomen - soft, NT MSK - no pain/tenderness and swelling at bilateral knees, minimal pedal edema Skin- no obvious rashes   Pertinent Microbiology Results for orders placed or performed during the hospital encounter of 08/16/20  Culture, blood (routine x 2) Call MD if unable to obtain prior to antibiotics being given     Status: Abnormal (Preliminary result)   Collection Time: 08/16/20  3:50 PM   Specimen: BLOOD  Result Value Ref Range Status   Specimen Description BLOOD RIGHT ANTECUBITAL  Final   Special Requests   Final    BOTTLES DRAWN AEROBIC AND ANAEROBIC Blood Culture results may not be optimal due to an inadequate volume of blood received in culture bottles   Culture  Setup Time (A)  Final    YEAST AEROBIC BOTTLE ONLY CRITICAL RESULT CALLED TO, READ BACK BY AND VERIFIED WITH: PHARMD ABBOTT OA:[id number] FCP Performed at [facility], [place] [address] [place], [place] [address]    Culture YEAST  Final   Report Status PENDING  Incomplete  Blood Culture ID Panel (Reflexed)     Status: Abnormal   Collection Time: 08/16/20  3:50 PM  Result Value Ref Range Status   Enterococcus faecalis NOT DETECTED NOT DETECTED Final   Enterococcus Faecium NOT DETECTED NOT DETECTED Final   Listeria monocytogenes NOT DETECTED NOT DETECTED Final   Staphylococcus species  NOT DETECTED NOT DETECTED Final   Staphylococcus aureus (BCID) NOT DETECTED NOT DETECTED Final   Staphylococcus epidermidis NOT DETECTED NOT DETECTED Final   Staphylococcus lugdunensis NOT DETECTED NOT DETECTED Final   Streptococcus species NOT DETECTED NOT DETECTED Final   Streptococcus agalactiae NOT DETECTED NOT DETECTED Final   Streptococcus pneumoniae NOT DETECTED NOT DETECTED Final   Streptococcus pyogenes NOT DETECTED NOT DETECTED Final   A.calcoaceticus-baumannii NOT DETECTED NOT DETECTED Final   Bacteroides fragilis NOT DETECTED NOT DETECTED Final   Enterobacterales NOT DETECTED NOT DETECTED Final   Enterobacter cloacae complex NOT DETECTED NOT DETECTED Final   Escherichia coli NOT DETECTED NOT DETECTED Final   Klebsiella aerogenes NOT DETECTED NOT DETECTED Final   Klebsiella oxytoca NOT DETECTED NOT DETECTED Final   Klebsiella pneumoniae NOT DETECTED NOT DETECTED Final   Proteus species NOT DETECTED NOT DETECTED Final   Salmonella species NOT DETECTED NOT DETECTED Final   Serratia marcescens NOT DETECTED NOT DETECTED Final   Haemophilus influenzae NOT DETECTED NOT DETECTED Final   Neisseria meningitidis NOT DETECTED NOT DETECTED Final   Pseudomonas aeruginosa NOT DETECTED NOT DETECTED Final   Stenotrophomonas maltophilia NOT DETECTED NOT DETECTED Final   Candida albicans DETECTED (A) NOT DETECTED Final    Comment: CRITICAL RESULT CALLED TO, READ BACK BY AND VERIFIED WITH: PHARMD ABBOTT OA:[id number] FCP    Candida auris NOT DETECTED NOT DETECTED Final   Candida glabrata NOT DETECTED NOT DETECTED Final   Candida krusei NOT DETECTED NOT DETECTED Final   Candida parapsilosis NOT DETECTED NOT DETECTED Final   Candida tropicalis NOT DETECTED NOT DETECTED Final   Cryptococcus neoformans/gattii NOT DETECTED NOT DETECTED Final    Comment: Performed at [facility], [address] [place], [place] [address]  Culture, blood (routine x 2) Call MD if unable to obtain prior to  antibiotics being given     Status: Abnormal (Preliminary result)   Collection Time: 08/16/20  3:52 PM  Specimen: BLOOD LEFT ARM  Result Value Ref Range Status   Specimen Description BLOOD LEFT ARM  Final   Special Requests   Final    BOTTLES DRAWN AEROBIC AND ANAEROBIC Blood Culture results may not be optimal due to an inadequate volume of blood received in culture bottles   Culture  Setup Time (A)  Final    YEAST AEROBIC BOTTLE ONLY CRITICAL VALUE NOTED.  VALUE IS CONSISTENT WITH PREVIOUSLY REPORTED AND CALLED VALUE. Performed at  Hospital Lab, [place] [address] [place], [place] [address]    Culture YEAST  Final   Report Status PENDING  Incomplete      Pertinent Lab seen by me: CBC Latest Ref Rng & Units 08/18/2020 08/17/2020 08/16/2020  WBC 4.0 - 10.5 K/uL 4.9 4.4 5.4  Hemoglobin 12.0 - 15.0 g/dL 8.1(L) 7.1(L) 8.5(L)  Hematocrit 36.0 - 46.0 % 24.2(L) 22.2(L) 26.5(L)  Platelets 150 - 400 K/uL 132(L) 125(L) 134(L)   CMP Latest Ref Rng & Units 08/18/2020 08/17/2020 08/16/2020  Glucose 70 - 99 mg/dL 229(H) 200(H) 104(H)  BUN 8 - 23 mg/dL 59(H) 56(H) 56(H)  Creatinine 0.44 - 1.00 mg/dL 3.48(H) 3.54(H) 3.77(H)  Sodium 135 - 145 mmol/L 132(L) 133(L) 135  Potassium 3.5 - 5.1 mmol/L 4.2 4.9 4.8  Chloride 98 - 111 mmol/L 101 100 99  CO2 22 - 32 mmol/L '23 26 27  '$ Calcium 8.9 - 10.3 mg/dL 7.7(L) 7.9(L) 8.9  Total Protein 6.5 - 8.1 g/dL 5.0(L) - 6.0(L)  Total Bilirubin 0.3 - 1.2 mg/dL 0.5 - 1.0  Alkaline Phos 38 - 126 U/L 67 - 70  AST 15 - 41 U/L 18 - 15  ALT 0 - 44 U/L 15 - 16     Pertinent Imagings/Other Imagings Plain films and CT images have been personally visualized and interpreted; radiology reports have been reviewed. Decision making incorporated into the Impression / Recommendations.  Chest Xray 08/17/20 FINDINGS: Stable cardiomediastinal silhouette. No pneumothorax or pleural effusion is noted. No consolidative process is noted. Bony thorax is unremarkable.  IMPRESSION: No  active disease.  I have spent 60 minutes for this patient encounter including review of prior medical records with greater than 50% of time being face to face and coordination of their care.  Electronically signed by:   Rosiland Oz, MD Infectious Disease Physician [facility] Pager: [phone]

## 2020-08-18 NOTE — Progress Notes (Signed)
PROGRESS NOTE                                                                             PROGRESS NOTE                                                                                                                                                                                                             Patient Demographics:    Ruth Riley, is a 75 y.o. female, DOB - 1945/12/29, [id number]  Outpatient Primary MD for the patient is [name], MD    LOS - 1  Admit date - 08/16/2020    No chief complaint on file.      Brief Narrative     Ruth Riley is a 75 y.o. female with medical history significant of DM; HTN; and h/o renal transplant with current stage 4 CKD (baseline 3.7) with anemia (baseline 9) presenting to [facility] with confusion.  When asked what brought her to the hospital, the patient repeatedly returns to her litany of orthopedic complaints.  Most recently, she did fall and injure her R wrist and then fell again and injured her L hand.  She acknowledges some SOB and cough that appear to have started in the last week; she was feeling better after her COVID infection in early February.  Notes from [facility] indicate that the patient has had intermittent confusion and persistent lethargy with fever to 101.  She was recently started on oxycodone and prednisone and this was thought to be a possible cause for her AMS.   -Work-up in [facility] hospital showing her Hemoglobin dropped from 9 to 7.5 with stool for occult blood positive. Patient's stool was dark but clearly melanotic. No Gastroenterologist at [place] this week so requested transfer.      Subjective:    Ruth Riley today reports no bowel movements yet, denies any fever or chills, still complains of right wrist pain.   Assessment  & Plan :    Principal Problem:   Acute metabolic encephalopathy Active Problems:  Anemia in chronic kidney disease   Diabetes mellitus  type 2 in nonobese [facility])   Essential hypertension   H/O kidney transplant   Stage 4 chronic kidney disease (HCC)   Bilateral wrist pain   Multifocal pneumonia   History of COVID-19   Chronic back pain   Blood in the stool    Acute metabolic encephalopathy -Patient presenting with encephalopathy as evidenced by her confusion and lethargy -While the patient does appear to also have dementia, this is a change compared to her usual baseline mental status -This appears to be multifactorial and related to post-COVID PNA as well as possible GI bleed -Mentation back to baseline.   Multifocal PNA with h/o COVID-19 infection -Patient presenting with productive cough, mildly decreased oxygen saturation. -Chest x-ray and wrangle confirming subtle bibasilar opacities  -She was encouraged with incentive spirometry, flutter valve . -Continue with IV azithromycin and Rocephin . -She was encouraged to use incentive spirometry and flutter valve . -Steroid-dependent, continue with stress dose steroids.  Decrease dosing to every 12 hours  Fungemia -Cardiomyopathy appreciated, currently on IV unresponding, followed by ID  continue to GI bleed.  Anemia, possible GI bleed -Hemoglobin this morning 7.1, down from 8.5, 2 Ruth Riley 4/6, hemoglobin stable this morning. -GI Input greatly appreciated, plan for EGD/colonoscopy when she is more stable .  Stage 4 CKD, h/o renal transplant -Continue tacrolimus, change prednisone to stress dosed steroids -Continue Bicarb -Replete with IVF -Continue Bactrim 3x/week  DM -Continue Lantus -will cover with sensitive-scale SSI  HTN -Continue Coreg  HLD -Continue Crestor  Wrist pain -She has had recent falls and has B wrist pain -L 4th finger broken but splint is undone; will request ortho tech to replace splint -R hand appears to be more c/w carpal tunnel or nerve impingement.   -   Continue with nocturnal splinting - MRI of wrist and hand  without contrast are pending.  Back pain -I have reviewed this patient in the [place] Controlled Substances Reporting System.  Se is receiving medications from multiple providers but appears to be not be receiving regular prescriptions. -She is not at particularly high risk of opioid misuse, diversion, or overdose. -Continue Oxy IR prn -Continue Requip    SpO2: 96 %  Recent Labs  Lab 08/16/20 1556 08/17/20 0109 08/18/20 0232  WBC 5.4 4.4 4.9  PLT 134* 125* 132*  PROCALCITON 1.10  --   --   AST 15  --  18  ALT 16  --  15  ALKPHOS 70  --  67  BILITOT 1.0  --  0.5  ALBUMIN 2.6*  --  2.0*       ABG  No results found for: PHART, PCO2ART, PO2ART, HCO3, TCO2, ACIDBASEDEF, O2SAT        Condition - Extremely Guarded  Family Communication  :  Husband at bedside  Code Status :  DNR  Consults  :  none  Disposition Plan  :    Status is: Inpatient  Remains inpatient appropriate because:IV treatments appropriate due to intensity of illness or inability to take PO   Dispo: The patient is from: Home              Anticipated d/c is to: Home              Patient currently is not medically stable to d/c.   Difficult to place patient No      DVT Prophylaxis  : SCDs   Lab Results  Component  Value Date   PLT 132 (L) 08/18/2020    Diet :  Diet Order            Diet Carb Modified Fluid consistency: Thin; Room service appropriate? Yes  Diet effective now                  Inpatient Medications  Scheduled Meds: . carvedilol  25 mg Oral BID  . hydrocortisone sod succinate (SOLU-CORTEF) inj  50 mg Intravenous Q12H  . insulin aspart  0-9 Units Subcutaneous TID WC  . insulin glargine  10 Units Subcutaneous QHS  . pantoprazole  40 mg Oral BID  . rOPINIRole  3 mg Oral QHS  . rosuvastatin  20 mg Oral Daily  . sertraline  100 mg Oral QHS  . sodium bicarbonate  1,300 mg Oral BID  . sulfamethoxazole-trimethoprim  1 tablet Oral Once per day on Mon Wed Fri  . tacrolimus   1 mg Oral TID   Continuous Infusions: . sodium chloride 50 mL/hr at 08/18/20 0350  . [START ON 08/19/2020] anidulafungin    . azithromycin 500 mg (08/18/20 0851)  . cefTRIAXone (ROCEPHIN)  IV 2 g (08/18/20 0749)   PRN Meds:.acetaminophen **OR** acetaminophen, albuterol, calcium carbonate (dosed in mg elemental calcium), camphor-menthol **AND** hydrOXYzine, docusate sodium, feeding supplement (NEPRO CARB STEADY), guaiFENesin, hydrALAZINE, HYDROcodone-acetaminophen, ondansetron **OR** ondansetron (ZOFRAN) IV, oxyCODONE, sorbitol, zolpidem  Antibiotics  :    Anti-infectives (From admission, onward)   Start     Dose/Rate Route Frequency Ordered Stop   08/19/20 1000  anidulafungin (ERAXIS) 100 mg in sodium chloride 0.9 % 100 mL IVPB        100 mg 78 mL/hr over 100 Minutes Intravenous Daily 08/18/20 0247     08/18/20 0400  anidulafungin (ERAXIS) 200 mg in sodium chloride 0.9 % 200 mL IVPB        200 mg 78 mL/hr over 200 Minutes Intravenous  Once 08/18/20 0247 08/18/20 0704   08/17/20 0900  sulfamethoxazole-trimethoprim (BACTRIM) 400-80 MG per tablet 1 tablet        1 tablet Oral Once per day on Mon Wed Fri 08/16/20 1430     08/17/20 0800  cefTRIAXone (ROCEPHIN) 2 g in sodium chloride 0.9 % 100 mL IVPB        2 g 200 mL/hr over 30 Minutes Intravenous Every 24 hours 08/16/20 1430 08/21/20 0759   08/17/20 0800  azithromycin (ZITHROMAX) 500 mg in sodium chloride 0.9 % 250 mL IVPB        500 mg 250 mL/hr over 60 Minutes Intravenous Every 24 hours 08/16/20 1430 08/21/20 0759         Emeline Gins Maja Mccaffery M.D on 08/18/2020 at 3:38 PM  To page go to [email or web address]   [facility] -  Office  [phone]     Objective:   Vitals:   08/18/20 0130 08/18/20 0500 08/18/20 0635 08/18/20 1500  BP: 128/65 (!) 175/93 134/71 (!) 160/86  Pulse:  74  66  Resp:  16  16  Temp:  97.7 F (36.5 C)  97.8 F (36.6 C)  TempSrc:  Oral  Oral  SpO2:  97%  96%  Weight:      Height:        Wt Readings  from Last 3 Encounters:  08/16/20 57.6 kg  08/08/20 56.7 kg  05/26/20 56.8 kg     Intake/Output Summary (Last 24 hours) at 08/18/2020 1538 Last data filed at 08/18/2020 0513 Gross per 24 hour  Intake 917.76 ml  Output 700 ml  Net 217.76 ml     Physical Exam  Awake Alert, Oriented X 3, No new F.N deficits, Normal affect Symmetrical Chest wall movement, Good air movement bilaterally, CTAB RRR,No Gallops,Rubs or new Murmurs, No Parasternal Heave +ve B.Sounds, Abd Soft, No tenderness, No rebound - guarding or rigidity. No Cyanosis, Clubbing or edema, No new Rash or bruise  , she is having hands deformities secondary to edema, with right hand edema.    Data Review:    CBC Recent Labs  Lab 08/16/20 1556 08/17/20 0109 08/18/20 0232  WBC 5.4 4.4 4.9  HGB 8.5* 7.1* 8.1*  HCT 26.5* 22.2* 24.2*  PLT 134* 125* 132*  MCV 93.3 93.3 86.7  MCH 29.9 29.8 29.0  MCHC 32.1 32.0 33.5  RDW 16.5* 16.5* 19.7*  LYMPHSABS 0.5*  --   --   MONOABS 0.2  --   --   EOSABS 0.1  --   --   BASOSABS 0.0  --   --     Recent Labs  Lab 08/16/20 1556 08/17/20 0109 08/18/20 0232  NA 135 133* 132*  K 4.8 4.9 4.2  CL 99 100 101  CO2 '27 26 23  '$ GLUCOSE 104* 200* 229*  BUN 56* 56* 59*  CREATININE 3.77* 3.54* 3.48*  CALCIUM 8.9 7.9* 7.7*  AST 15  --  18  ALT 16  --  15  ALKPHOS 70  --  67  BILITOT 1.0  --  0.5  ALBUMIN 2.6*  --  2.0*  PROCALCITON 1.10  --   --   TSH 2.903  --   --     ------------------------------------------------------------------------------------------------------------------ No results for input(s): CHOL, HDL, LDLCALC, TRIG, CHOLHDL, LDLDIRECT in the last 72 hours.  No results found for: HGBA1C ------------------------------------------------------------------------------------------------------------------ Recent Labs    08/16/20 1556  TSH 2.903    Cardiac Enzymes No results for input(s): CKMB, TROPONINI, MYOGLOBIN in the last 168 hours.  Invalid input(s):  CK ------------------------------------------------------------------------------------------------------------------ No results found for: BNP  Micro Results Recent Results (from the past 240 hour(s))  Culture, blood (routine x 2) Call MD if unable to obtain prior to antibiotics being given     Status: Abnormal (Preliminary result)   Collection Time: 08/16/20  3:50 PM   Specimen: BLOOD  Result Value Ref Range Status   Specimen Description BLOOD RIGHT ANTECUBITAL  Final   Special Requests   Final    BOTTLES DRAWN AEROBIC AND ANAEROBIC Blood Culture results may not be optimal due to an inadequate volume of blood received in culture bottles   Culture  Setup Time (A)  Final    YEAST AEROBIC BOTTLE ONLY CRITICAL RESULT CALLED TO, READ BACK BY AND VERIFIED WITH: PHARMD ABBOTT [id number] FCP Performed at [facility], [address] [place], [place] [address]    Culture YEAST  Final   Report Status PENDING  Incomplete  Blood Culture ID Panel (Reflexed)     Status: Abnormal   Collection Time: 08/16/20  3:50 PM  Result Value Ref Range Status   Enterococcus faecalis NOT DETECTED NOT DETECTED Final   Enterococcus Faecium NOT DETECTED NOT DETECTED Final   Listeria monocytogenes NOT DETECTED NOT DETECTED Final   Staphylococcus species NOT DETECTED NOT DETECTED Final   Staphylococcus aureus (BCID) NOT DETECTED NOT DETECTED Final   Staphylococcus epidermidis NOT DETECTED NOT DETECTED Final   Staphylococcus lugdunensis NOT DETECTED NOT DETECTED Final   Streptococcus species NOT DETECTED NOT DETECTED Final   Streptococcus agalactiae NOT  DETECTED NOT DETECTED Final   Streptococcus pneumoniae NOT DETECTED NOT DETECTED Final   Streptococcus pyogenes NOT DETECTED NOT DETECTED Final   A.calcoaceticus-baumannii NOT DETECTED NOT DETECTED Final   Bacteroides fragilis NOT DETECTED NOT DETECTED Final   Enterobacterales NOT DETECTED NOT DETECTED Final   Enterobacter cloacae complex NOT DETECTED  NOT DETECTED Final   Escherichia coli NOT DETECTED NOT DETECTED Final   Klebsiella aerogenes NOT DETECTED NOT DETECTED Final   Klebsiella oxytoca NOT DETECTED NOT DETECTED Final   Klebsiella pneumoniae NOT DETECTED NOT DETECTED Final   Proteus species NOT DETECTED NOT DETECTED Final   Salmonella species NOT DETECTED NOT DETECTED Final   Serratia marcescens NOT DETECTED NOT DETECTED Final   Haemophilus influenzae NOT DETECTED NOT DETECTED Final   Neisseria meningitidis NOT DETECTED NOT DETECTED Final   Pseudomonas aeruginosa NOT DETECTED NOT DETECTED Final   Stenotrophomonas maltophilia NOT DETECTED NOT DETECTED Final   Candida albicans DETECTED (A) NOT DETECTED Final    Comment: CRITICAL RESULT CALLED TO, READ BACK BY AND VERIFIED WITH: PHARMD ABBOTT [id number] FCP    Candida auris NOT DETECTED NOT DETECTED Final   Candida glabrata NOT DETECTED NOT DETECTED Final   Candida krusei NOT DETECTED NOT DETECTED Final   Candida parapsilosis NOT DETECTED NOT DETECTED Final   Candida tropicalis NOT DETECTED NOT DETECTED Final   Cryptococcus neoformans/gattii NOT DETECTED NOT DETECTED Final    Comment: Performed at [facility], [address] [place], [place] [address]  Culture, blood (routine x 2) Call MD if unable to obtain prior to antibiotics being given     Status: Abnormal (Preliminary result)   Collection Time: 08/16/20  3:52 PM   Specimen: BLOOD LEFT ARM  Result Value Ref Range Status   Specimen Description BLOOD LEFT ARM  Final   Special Requests   Final    BOTTLES DRAWN AEROBIC AND ANAEROBIC Blood Culture results may not be optimal due to an inadequate volume of blood received in culture bottles   Culture  Setup Time (A)  Final    YEAST AEROBIC BOTTLE ONLY CRITICAL VALUE NOTED.  VALUE IS CONSISTENT WITH PREVIOUSLY REPORTED AND CALLED VALUE. Performed at [facility], [place] [address] [place], [place] [address]    Culture YEAST  Final   Report Status PENDING   Incomplete  Culture, blood (routine x 2)     Status: None (Preliminary result)   Collection Time: 08/18/20  7:51 AM   Specimen: BLOOD  Result Value Ref Range Status   Specimen Description BLOOD RIGHT ANTECUBITAL  Final   Special Requests   Final    BOTTLES DRAWN AEROBIC AND ANAEROBIC Blood Culture adequate volume   Culture   Final    NO GROWTH < 12 HOURS Performed at [facility], [place] [address] [place], [place] [address]    Report Status PENDING  Incomplete  Culture, blood (routine x 2)     Status: None (Preliminary result)   Collection Time: 08/18/20  8:03 AM   Specimen: BLOOD LEFT WRIST  Result Value Ref Range Status   Specimen Description BLOOD LEFT WRIST  Final   Special Requests   Final    BOTTLES DRAWN AEROBIC AND ANAEROBIC Blood Culture adequate volume   Culture   Final    NO GROWTH < 12 HOURS Performed at [facility], [place] [address] [place], [place] [address]    Report Status PENDING  Incomplete    Radiology Reports DG Chest  Port 1 View  Result Date: 08/17/2020 CLINICAL DATA:  Dyspnea. EXAM: PORTABLE CHEST 1 VIEW COMPARISON:  August 15, 2020. FINDINGS: Stable cardiomediastinal silhouette. No pneumothorax or pleural effusion is noted. No consolidative process is noted. Bony thorax is unremarkable. IMPRESSION: No active disease. Electronically Signed   By: Marijo Conception M.D.   On: 08/17/2020 08:46

## 2020-08-18 NOTE — Consult Note (Addendum)
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  TTE Ophthalmology evaluation  Fu MRI rt wrist and MRI RT hand +/- Ortho consult  Continue Bactrim ppx GI bleed management per GI and Primary   Rest of the management as per the primary team. Please call with questions or concerns.  Thank you for the  consult _________________________________________________________________________________________________________ HPI and Hospital Course: 75 Y O female with PMH of DM, HTN, h/o renal transplant with current stage 4 CKD, COVID in February 5 who presented to the ED from [facility] where she initially presented with confusion, lethargy and fever. At [facility], she was told to have PNA and started on antibiotics. She was transferred to [facility] for higher level of care due to concerns for GI Bleed.  She reports fracture in her rt wrist and left ring finger after falling down. She also seems to reportedly have SOB and cough last week prior to presentation. Her reported h/o confusion on presentation seems to be improved. She is able to tell me decent history. She says she was supposed to have an MRI of her rt wrist and left hand as recommended by OP Ortho and still has pain and tenderness over those areas. She has been seen by Ortho and GI already.   Does not have central lines, but has h/o bilateral knee replacement with no issues currently   At ED, she was afebrile, no leukocytosis,  hemodynamically stable. Blood cx positive for yeast   ROS: General- Denies fever, chills, loss of appetite and loss of weight HEENT - Denies headache, blurry vision, neck pain, sinus pain Chest - Denies any chest pain, SOB or cough CVS- Denies any dizziness/lightheadedness, syncopal attacks, palpitations Abdomen- Denies any nausea, vomiting, abdominal pain and diarrhea Neuro - Denies any numbness, tingling sensation, GENERALISED WEAKNESS Psych - Denies any changes in mood irritability or depressive symptoms GU- Denies any burning, dysuria, hematuria or increased frequency of urination Skin - denies any rashes/lesions MSK - denies any joint pain/swelling or restricted ROM   Past Medical History:  Diagnosis Date  . Diabetes mellitus type 2 in nonobese (HCC)   . Essential hypertension   . H/O kidney transplant   . Stage 4 chronic kidney  disease (HCC)      Scheduled Meds: . carvedilol  25 mg Oral BID  . hydrocortisone sod succinate (SOLU-CORTEF) inj  50 mg Intravenous Q12H  . insulin aspart  0-9 Units Subcutaneous TID WC  . insulin glargine  10 Units Subcutaneous QHS  . pantoprazole  40 mg Oral BID  . rOPINIRole  3 mg Oral QHS  . rosuvastatin  20 mg Oral Daily  . sertraline  100 mg Oral QHS  . sodium bicarbonate  1,300 mg Oral BID  . sulfamethoxazole-trimethoprim  1 tablet Oral Once per day on Mon Wed Fri  . tacrolimus  1 mg Oral TID   Continuous Infusions: . sodium chloride 50 mL/hr at 08/18/20 0350  . [START ON 08/19/2020] anidulafungin    . azithromycin 500 mg (08/17/20 1337)  . cefTRIAXone (ROCEPHIN)  IV 2 g (08/17/20 0843)   PRN Meds:.acetaminophen **OR** acetaminophen, albuterol, calcium carbonate (dosed in mg elemental calcium), camphor-menthol **AND** hydrOXYzine, docusate sodium, feeding supplement (NEPRO CARB STEADY), guaiFENesin, hydrALAZINE, HYDROcodone-acetaminophen, ondansetron **OR** ondansetron (ZOFRAN) IV, oxyCODONE, sorbitol, zolpidem  Allergies  Allergen Reactions  . Penicillins Hives and Other (See Comments)  . Tramadol Other (See Comments) and Palpitations    Patient reports it makes her feel spaced out Patient reports it makes her feel spaced out Patient reports it makes her feel spaced out Patient reports it makes her feel spaced out   . Donepezil Diarrhea and Other (See Comments)  . Amlodipine Swelling    dizziness   Social History   Socioeconomic History  . Marital status: Married    Spouse name: Not on file  . Number of children: Not on file  . Years of education: Not on file  . Highest education level: Not on file  Occupational History  . Not on file  Tobacco Use  . Smoking status: Never Smoker  . Smokeless tobacco: Never Used  Substance and Sexual Activity  . Alcohol use: Not on file  . Drug use: Not on file  . Sexual activity: Not on file  Other Topics Concern  . Not  on file  Social History Narrative  . Not on file   Social Determinants of Health   Financial Resource Strain: Not on file  Food Insecurity: Not on file  Transportation Needs: Not on file  Physical Activity: Not on file  Stress: Not on file  Social Connections: Not on file  Intimate Partner Violence: Not on file      Vitals BP 134/71   Pulse 74   Temp 97.7 F (36.5 C) (Oral)   Resp 16   Ht '5\' 1"'$  (1.549 m)   Wt 57.6 kg   SpO2 97%   BMI 23.98 kg/m  Physical Exam Elderly, frail female lying in bed, awake, alert and oriented Pale conjunctiva No  Thrush Chest - rhonchi+ CVS- normal heart sounds, no murmur Abdomen - soft, NT MSK - no pain/tenderness and swelling at bilateral knees, minimal pedal edema Skin- no obvious rashes   Pertinent Microbiology Results for orders placed or performed during the hospital encounter of 08/16/20  Culture, blood (routine x 2) Call MD if unable to obtain prior to antibiotics being given     Status: Abnormal (Preliminary result)   Collection Time: 08/16/20  3:50 PM   Specimen: BLOOD  Result Value Ref Range Status   Specimen Description BLOOD RIGHT ANTECUBITAL  Final   Special Requests   Final    BOTTLES DRAWN AEROBIC AND ANAEROBIC Blood Culture results may not be optimal due to an inadequate volume of blood received in culture bottles   Culture  Setup Time (A)  Final    YEAST AEROBIC BOTTLE ONLY CRITICAL RESULT CALLED TO, READ BACK BY AND VERIFIED WITH: PHARMD ABBOTT [id number] FCP Performed at [facility], [place] [address], [place], [place] [address]    Culture YEAST  Final   Report Status PENDING  Incomplete  Blood Culture ID Panel (Reflexed)     Status: Abnormal   Collection Time: 08/16/20  3:50 PM  Result Value Ref Range Status   Enterococcus faecalis NOT DETECTED NOT DETECTED Final   Enterococcus Faecium NOT DETECTED NOT DETECTED Final   Listeria monocytogenes NOT DETECTED NOT DETECTED Final   Staphylococcus species  NOT DETECTED NOT DETECTED Final   Staphylococcus aureus (BCID) NOT DETECTED NOT DETECTED Final   Staphylococcus epidermidis NOT DETECTED NOT DETECTED Final   Staphylococcus lugdunensis NOT DETECTED NOT DETECTED Final   Streptococcus species NOT DETECTED NOT DETECTED Final   Streptococcus agalactiae NOT DETECTED NOT DETECTED Final   Streptococcus pneumoniae NOT DETECTED NOT DETECTED Final   Streptococcus pyogenes NOT DETECTED NOT DETECTED Final   A.calcoaceticus-baumannii NOT DETECTED NOT DETECTED Final   Bacteroides fragilis NOT DETECTED NOT DETECTED Final   Enterobacterales NOT DETECTED NOT DETECTED Final   Enterobacter cloacae complex NOT DETECTED NOT DETECTED Final   Escherichia coli NOT DETECTED NOT DETECTED Final   Klebsiella aerogenes NOT DETECTED NOT DETECTED Final   Klebsiella oxytoca NOT DETECTED NOT DETECTED Final   Klebsiella pneumoniae NOT DETECTED NOT DETECTED Final   Proteus species NOT DETECTED NOT DETECTED Final   Salmonella species NOT DETECTED NOT DETECTED Final   Serratia marcescens NOT DETECTED NOT DETECTED Final   Haemophilus influenzae NOT DETECTED NOT DETECTED Final   Neisseria meningitidis NOT DETECTED NOT DETECTED Final   Pseudomonas aeruginosa NOT DETECTED NOT DETECTED Final   Stenotrophomonas maltophilia NOT DETECTED NOT DETECTED Final   Candida albicans DETECTED (A) NOT DETECTED Final    Comment: CRITICAL RESULT CALLED TO, READ BACK BY AND VERIFIED WITH: PHARMD ABBOTT [id number] FCP    Candida auris NOT DETECTED NOT DETECTED Final   Candida glabrata NOT DETECTED NOT DETECTED Final   Candida krusei NOT DETECTED NOT DETECTED Final   Candida parapsilosis NOT DETECTED NOT DETECTED Final   Candida tropicalis NOT DETECTED NOT DETECTED Final   Cryptococcus neoformans/gattii NOT DETECTED NOT DETECTED Final    Comment: Performed at [facility], [address] [place], [place] [address]  Culture, blood (routine x 2) Call MD if unable to obtain prior to  antibiotics being given     Status: Abnormal (Preliminary result)   Collection Time: 08/16/20  3:52 PM  Specimen: BLOOD LEFT ARM  Result Value Ref Range Status   Specimen Description BLOOD LEFT ARM  Final   Special Requests   Final    BOTTLES DRAWN AEROBIC AND ANAEROBIC Blood Culture results may not be optimal due to an inadequate volume of blood received in culture bottles   Culture  Setup Time (A)  Final    YEAST AEROBIC BOTTLE ONLY CRITICAL VALUE NOTED.  VALUE IS CONSISTENT WITH PREVIOUSLY REPORTED AND CALLED VALUE. Performed at [facility], [place] [address], [place], [place] [address]    Culture YEAST  Final   Report Status PENDING  Incomplete      Pertinent Lab seen by me: CBC Latest Ref Rng & Units 08/18/2020 08/17/2020 08/16/2020  WBC 4.0 - 10.5 K/uL 4.9 4.4 5.4  Hemoglobin 12.0 - 15.0 g/dL 8.1(L) 7.1(L) 8.5(L)  Hematocrit 36.0 - 46.0 % 24.2(L) 22.2(L) 26.5(L)  Platelets 150 - 400 K/uL 132(L) 125(L) 134(L)   CMP Latest Ref Rng & Units 08/18/2020 08/17/2020 08/16/2020  Glucose 70 - 99 mg/dL 229(H) 200(H) 104(H)  BUN 8 - 23 mg/dL 59(H) 56(H) 56(H)  Creatinine 0.44 - 1.00 mg/dL 3.48(H) 3.54(H) 3.77(H)  Sodium 135 - 145 mmol/L 132(L) 133(L) 135  Potassium 3.5 - 5.1 mmol/L 4.2 4.9 4.8  Chloride 98 - 111 mmol/L 101 100 99  CO2 22 - 32 mmol/L '23 26 27  '$ Calcium 8.9 - 10.3 mg/dL 7.7(L) 7.9(L) 8.9  Total Protein 6.5 - 8.1 g/dL 5.0(L) - 6.0(L)  Total Bilirubin 0.3 - 1.2 mg/dL 0.5 - 1.0  Alkaline Phos 38 - 126 U/L 67 - 70  AST 15 - 41 U/L 18 - 15  ALT 0 - 44 U/L 15 - 16     Pertinent Imagings/Other Imagings Plain films and CT images have been personally visualized and interpreted; radiology reports have been reviewed. Decision making incorporated into the Impression / Recommendations.  Chest Xray 08/17/20 FINDINGS: Stable cardiomediastinal silhouette. No pneumothorax or pleural effusion is noted. No consolidative process is noted. Bony thorax is unremarkable.  IMPRESSION: No  active disease.  I have spent 60 minutes for this patient encounter including review of prior medical records with greater than 50% of time being face to face and coordination of their care.  Electronically signed by:   Rosiland Oz, MD Infectious Disease Physician [facility] for Infectious Disease Pager: [phone]

## 2020-08-18 NOTE — Plan of Care (Signed)

## 2020-08-18 NOTE — TOC Initial Note (Signed)
Transition of Care [facility]) - Initial/Assessment Note    Patient Details  Name: Ruth Riley MRN: [id number] Date of Birth: Sep 15, 1945  Transition of Care [facility]) CM/SW Contact:    Glennon Hamilton, [place] Work Phone Number: 08/18/2020, 12:50 PM  Clinical Narrative:                 CSW intern received consult for possible SNF placement at time of discharge. CSW intern spoke with patient and her husband, Ruth Riley. Patient expressed understanding of PT recommendation and is agreeable to SNF placement at time of discharge. Patient reports preference for [facility]) as they are currently residents at the [facility]. CSW intern discussed insurance authorization process. Patient has received the COVID vaccines. Patient expressed being hopeful for rehab and to feel better soon. No further questions reported at this time.          Patient Goals and CMS Choice        Expected Discharge Plan and Services                                                Prior Living Arrangements/Services                       Activities of Daily Living Home Assistive Devices/Equipment: Ruth Riley (specify type) ADL Screening (condition at time of admission) Patient's cognitive ability adequate to safely complete daily activities?: Yes Is the patient deaf or have difficulty hearing?: Yes Does the patient have difficulty seeing, even when wearing glasses/contacts?: No Does the patient have difficulty concentrating, remembering, or making decisions?: No Patient able to express need for assistance with ADLs?: No Does the patient have difficulty dressing or bathing?: No Independently performs ADLs?: Yes (appropriate for developmental age) Does the patient have difficulty walking or climbing stairs?: No Weakness of Legs: None Weakness of Arms/Hands: None  Permission Sought/Granted                  Emotional Assessment              Admission diagnosis:  GI bleed  [K92.2] Pneumonia [id number] Acute metabolic encephalopathy [id number] Patient Active Problem List   Diagnosis Date Noted  . Blood in the stool   . Acute metabolic encephalopathy [id number]  . Bilateral wrist pain 08/16/2020  . Multifocal pneumonia 08/16/2020  . Chronic back pain 08/16/2020  . Diabetes mellitus type 2 in nonobese (HCC)   . Essential hypertension   . H/O kidney transplant   . Stage 4 chronic kidney disease ([place])   . History of COVID-19 06/18/2020  . Anemia in chronic kidney disease 02/21/2020   PCP:  [name], MD Pharmacy:   [facility], [place] [address] Phone: [phone] Fax: [phone]     Social Determinants of Health (SDOH) Interventions    Readmission Risk Interventions No flowsheet data found.

## 2020-08-19 ENCOUNTER — Inpatient Hospital Stay (HOSPITAL_COMMUNITY): Payer: Medicare Other

## 2020-08-19 DIAGNOSIS — R509 Fever, unspecified: Secondary | ICD-10-CM | POA: Diagnosis not present

## 2020-08-19 DIAGNOSIS — G9341 Metabolic encephalopathy: Secondary | ICD-10-CM | POA: Diagnosis not present

## 2020-08-19 DIAGNOSIS — N185 Chronic kidney disease, stage 5: Secondary | ICD-10-CM | POA: Diagnosis not present

## 2020-08-19 DIAGNOSIS — Z940 Kidney transplant status: Secondary | ICD-10-CM | POA: Diagnosis not present

## 2020-08-19 DIAGNOSIS — J189 Pneumonia, unspecified organism: Secondary | ICD-10-CM | POA: Diagnosis not present

## 2020-08-19 LAB — CBC
HCT: 26.2 % — ABNORMAL LOW (ref 36.0–46.0)
Hemoglobin: 8.4 g/dL — ABNORMAL LOW (ref 12.0–15.0)
MCH: 27.9 pg (ref 26.0–34.0)
MCHC: 32.1 g/dL (ref 30.0–36.0)
MCV: 87 fL (ref 80.0–100.0)
Platelets: 141 10*3/uL — ABNORMAL LOW (ref 150–400)
RBC: 3.01 MIL/uL — ABNORMAL LOW (ref 3.87–5.11)
RDW: 19.2 % — ABNORMAL HIGH (ref 11.5–15.5)
WBC: 3 10*3/uL — ABNORMAL LOW (ref 4.0–10.5)
nRBC: 0 % (ref 0.0–0.2)

## 2020-08-19 LAB — GLUCOSE, CAPILLARY
Glucose-Capillary: 67 mg/dL — ABNORMAL LOW (ref 70–99)
Glucose-Capillary: 70 mg/dL (ref 70–99)
Glucose-Capillary: 139 mg/dL — ABNORMAL HIGH (ref 70–99)
Glucose-Capillary: 187 mg/dL — ABNORMAL HIGH (ref 70–99)
Glucose-Capillary: 144 mg/dL — ABNORMAL HIGH (ref 70–99)

## 2020-08-19 LAB — ECHOCARDIOGRAM COMPLETE
Area-P 1/2: 2.21 cm2
Height: 61 in
MV VTI: 0.71 cm2
S' Lateral: 2.2 cm
Weight: 2030.4 oz

## 2020-08-19 LAB — COMPREHENSIVE METABOLIC PANEL
ALT: 16 U/L (ref 0–44)
AST: 16 U/L (ref 15–41)
Albumin: 1.9 g/dL — ABNORMAL LOW (ref 3.5–5.0)
Alkaline Phosphatase: 62 U/L (ref 38–126)
Anion gap: 11 (ref 5–15)
BUN: 56 mg/dL — ABNORMAL HIGH (ref 8–23)
CO2: 23 mmol/L (ref 22–32)
Calcium: 8 mg/dL — ABNORMAL LOW (ref 8.9–10.3)
Chloride: 101 mmol/L (ref 98–111)
Creatinine, Ser: 3.44 mg/dL — ABNORMAL HIGH (ref 0.44–1.00)
GFR, Estimated: 14 mL/min — ABNORMAL LOW (ref >60)
Glucose, Bld: 122 mg/dL — ABNORMAL HIGH (ref 70–99)
Potassium: 3.4 mmol/L — ABNORMAL LOW (ref 3.5–5.1)
Sodium: 135 mmol/L (ref 135–145)
Total Bilirubin: 0.4 mg/dL (ref 0.3–1.2)
Total Protein: 5 g/dL — ABNORMAL LOW (ref 6.5–8.1)

## 2020-08-19 MED ORDER — CEFDINIR 300 MG PO CAPS: 300.0000 mg | ORAL_CAPSULE | Freq: Every day | ORAL | Status: DC

## 2020-08-19 MED ORDER — AZITHROMYCIN 500 MG PO TABS
500.0000 mg | ORAL_TABLET | Freq: Every day | ORAL | Status: AC
  Administered 2020-08-19 – 2020-08-21 (×3): 500 mg via ORAL
  Filled 2020-08-19 (×3): qty 1

## 2020-08-19 MED ORDER — AZITHROMYCIN 500 MG PO TABS: 500.0000 mg | ORAL_TABLET | Freq: Every day | ORAL | Status: DC

## 2020-08-19 MED ORDER — CEFDINIR 300 MG PO CAPS
300.0000 mg | ORAL_CAPSULE | Freq: Every day | ORAL | Status: AC
  Administered 2020-08-20 – 2020-08-21 (×2): 300 mg via ORAL
  Filled 2020-08-19 (×2): qty 1

## 2020-08-19 NOTE — Progress Notes (Signed)
Orthopedic Tech Progress Note Patient Details:  Ruth Riley [birth year] [id number]  Ortho Devices Type of Ortho Device: Arm sling Ortho Device/Splint Location: RUE Ortho Device/Splint Interventions: Ordered,Application,Adjustment   Post Interventions Patient Tolerated: Fair Instructions Provided: Adjustment of device,Care of device,Poper ambulation with device   [name] Newer 08/19/2020, 5:26 PM

## 2020-08-19 NOTE — Progress Notes (Signed)
Occupational Therapy Treatment Patient Details Name: Ruth Riley MRN: [id number] DOB: 07/19/1945 Today's Date: 08/19/2020    History of present illness 75 y.o. female presenting to [facility] with confusion, SoB, and cought, transferred to [facility] and admitted 08/16/20. Pt being treated for anemia, bilateral PNA and possible GI bleed.  PMH: DM; HTN; and h/o renal transplant with current stage 4 CKD (baseline 3.7) with anemia (baseline 9), COVID infection in early February. hx of multiple falls and orthopedic complaints most recently L ring finger fx.   OT comments  Pt. Seen for skilled OT treatment.  Agreeable to oob.  Able to complete bed mobility min a.  Mod a for sit/stand and pivot to recliner.  Limited by RUE pain and edema making hand held assist preferred transfer method for pt.  MD present at end of session.  Reports second attempt at [place] today of RUE.  Concerned with edema of RUE.  Spoke with me and states he is wanting to order a sling to aide in edema management, and requested the name.  Provided the name of elevated arm sling.  He states he will order this for her.  Reviewed this information also with OTR/L.  Per our conversation initial plan will be for pt. To be seen tomorrow by OTR/L and provide necessary therapeutic interventions for RUE pending results of imaging scheduled for today.  Compression wrapping may be indicated.     Follow Up Recommendations  SNF    Equipment Recommendations  3 in 1 bedside commode    Recommendations for Other Services      Precautions / Restrictions Precautions Precautions: Fall Precaution Comments: multple falls, increased pain medication Splint/Cast: on L ring finger, R hand brace not on due to increased swelling-per conversation wtih dr. Johnette Abraham 4/8 he is ordering elevated arm sling for RUE to manage edema       Mobility Bed Mobility Overal bed mobility: Needs Assistance Bed Mobility: Supine to Sit     Supine to sit: Mod assist      General bed mobility comments: no physical assistance required to guide B les out of bed. pt. required trunk support to transition upright secondary to limited use of rue for stabilization    Transfers Overall transfer level: Needs assistance Equipment used: 1 person hand held assist Transfers: Sit to/from [facility] Sit to Stand: Mod assist Stand pivot transfers: Mod assist       General transfer comment: able to come into standing with b ue support and pivot to recliner    Balance                                           ADL either performed or assessed with clinical judgement   ADL Overall ADL's : Needs assistance/impaired.  Socks became compromised with urine during session.  Total a for lb washing and dressing with new socks.                           Toilet Transfer: Moderate assistance;Stand-pivot Toilet Transfer Details (indicate cue type and reason): simulated eob to recliner-guided with b ue support adhering to pts. limited tolerance RUE.           Functional mobility during ADLs: Moderate assistance General ADL Comments: Pt limited by decreased strength, decreased ability to care for self and decreased activity tolerance.  Vision       Perception     Praxis      Cognition Arousal/Alertness: Awake/alert Behavior During Therapy: Flat affect Overall Cognitive Status: Impaired/Different from baseline Area of Impairment: Following commands;Problem solving;Memory                                        Exercises     Shoulder Instructions       General Comments      Pertinent Vitals/ Pain       Pain Assessment: Faces Faces Pain Scale: Hurts a little bit Pain Location: RUE Pain Descriptors / Indicators: Grimacing;Guarding  Home Living                                          Prior Functioning/Environment              Frequency  Min 2X/week         Progress Toward Goals  OT Goals(current goals can now be found in the care plan section)  Progress towards OT goals: Progressing toward goals     Plan Discharge plan remains appropriate    Co-evaluation                 AM-PAC OT "6 Clicks" Daily Activity     Outcome Measure     Help from another person taking care of personal grooming?: A Lot Help from another person toileting, which includes using toliet, bedpan, or urinal?: Total Help from another person bathing (including washing, rinsing, drying)?: A Lot Help from another person to put on and taking off regular upper body clothing?: A Lot Help from another person to put on and taking off regular lower body clothing?: A Lot 6 Click Score: 9    End of Session Equipment Utilized During Treatment: Gait belt  OT Visit Diagnosis: Unsteadiness on feet (R26.81);Muscle weakness (generalized) (M62.81);Pain;Other symptoms and signs involving cognitive function Pain - Right/Left: Right Pain - part of body: Hand   Activity Tolerance Patient tolerated treatment well   Patient Left in chair;with call bell/phone within reach;with nursing/sitter in room   Nurse Communication Other (comment) (rn present at end of session, reviewed recommendation for transfer back to bed when pt. ready)        [id number] OT Time Calculation (min): 23 min  Charges: OT General Charges $OT Visit: 1 Visit OT Treatments $Self Care/Home Management : 23-37 mins  Sonia Baller, COTA/L Acute Rehabilitation [phone]  08/19/2020, 11:14 AM

## 2020-08-19 NOTE — Progress Notes (Signed)
Physical Therapy Treatment Patient Details Name: Ruth Riley MRN: [id number] DOB: 1945-07-02 Today's Date: 08/19/2020    History of Present Illness 75 y.o. female presenting to [facility] with confusion, SoB, and cought, transferred to [facility] and admitted 08/16/20. Pt being treated for anemia, bilateral PNA and possible GI bleed.  PMH: DM; HTN; and h/o renal transplant with current stage 4 CKD (baseline 3.7) with anemia (baseline 9), COVID infection in early February. hx of multiple falls and orthopedic complaints most recently L ring finger fx.    PT Comments    Pt in recliner requesting to return to bed when therapist entered. Agreeable to use of BSC, before return. Pt continues to be limited in safe mobility by limited use of UE and slowed processing in presence of decreased strength and endurance. Pt is modAx2 for transferes and short distance ambulation with HHA and total A for returning LE to bed. D/c plans remain appropriate at this time. PT will continue to follow acutely.   Follow Up Recommendations  SNF     Equipment Recommendations  Other (comment) (has necessary equipment)       Precautions / Restrictions Precautions Precautions: Fall Precaution Comments: multple falls, increased pain medication Required Braces or Orthoses: Splint/Cast Splint/Cast: on L ring finger, R hand brace not on due to increased swelling    Mobility  Bed Mobility Overal bed mobility: Needs Assistance Bed Mobility: Sit to Supine      Sit to supine: Total assist   General bed mobility comments: pt with increased fatigue by the time she reached the bed and required total a for returning LE back to bed    Transfers Overall transfer level: Needs assistance Equipment used: 1 person hand held assist;2 person hand held assist Transfers: Sit to/from [facility] Sit to Stand: Mod assist;+2 safety/equipment Stand pivot transfers: Mod assist;+2 safety/equipment       General transfer  comment: modA for sit<>stand from chair and bed side commode, and with pivot tranfer to [facility]. pt unable to use bilateral UE below elbow pt rested UE on PT arms for face to face transfers using gait belt  Ambulation/Gait Ambulation/Gait assistance: Mod assist Gait Distance (Feet): 4 Feet Assistive device: 1 person hand held assist Gait Pattern/deviations: Step-to pattern;Decreased step length - right;Decreased step length - left;Shuffle Gait velocity: slowed Gait velocity interpretation: <1.31 ft/sec, indicative of household ambulator General Gait Details: modA for face to face ambulation from [facility] to bed, vc for sequencing and safety for pivoting to sit          Balance Overall balance assessment: Needs assistance Sitting-balance support: Feet supported;No upper extremity supported Sitting balance-Leahy Scale: Fair     Standing balance support: Bilateral upper extremity supported;During functional activity Standing balance-Leahy Scale: Poor Standing balance comment: pt requires outside support to steady                            Cognition Arousal/Alertness: Awake/alert Behavior During Therapy: Flat affect Overall Cognitive Status: Impaired/Different from baseline Area of Impairment: Following commands;Problem solving;Memory                     Memory: Decreased short-term memory Following Commands: Follows one step commands with increased time;Follows multi-step commands inconsistently;Follows multi-step commands with increased time     Problem Solving: Requires verbal cues;Requires tactile cues;Difficulty sequencing General Comments: poor memory of prior therapy session, requires increased time for processing and single step commands  General Comments General comments (skin integrity, edema, etc.): family in room on entry, however left before transfer to [facility]. VSS on RA,      Pertinent Vitals/Pain Pain Assessment: Faces Faces Pain Scale: Hurts  even more Pain Location: R wrist, L ring finger, L bruised ribs, low back Pain Descriptors / Indicators: Grimacing;Guarding Pain Intervention(s): Limited activity within patient's tolerance;Monitored during session;Repositioned           PT Goals (current goals can now be found in the care plan section) Acute Rehab PT Goals Patient Stated Goal: feel better PT Goal Formulation: With patient/family Time For Goal Achievement: 08/31/20 Potential to Achieve Goals: Good    Frequency    Min 3X/week      PT Plan Current plan remains appropriate       AM-PAC PT "6 Clicks" Mobility   Outcome Measure  Help needed turning from your back to your side while in a flat bed without using bedrails?: A Lot Help needed moving from lying on your back to sitting on the side of a flat bed without using bedrails?: A Lot Help needed moving to and from a bed to a chair (including a wheelchair)?: A Lot Help needed standing up from a chair using your arms (e.g., wheelchair or bedside chair)?: A Lot Help needed to walk in hospital room?: Total Help needed climbing 3-5 steps with a railing? : Total 6 Click Score: 10    End of Session Equipment Utilized During Treatment: Gait belt;Other (comment) Activity Tolerance: Patient tolerated treatment well Patient left: in bed;Other (comment) (Xray tech in room for R wrist X-ray, instructed to set up for lunch and provide call bell) Nurse Communication: Mobility status;Other (comment) (IV beeping) PT Visit Diagnosis: Unsteadiness on feet (R26.81);Muscle weakness (generalized) (M62.81);History of falling (Z91.81);Difficulty in walking, not elsewhere classified (R26.2);Pain     Time: [id number] PT Time Calculation (min) (ACUTE ONLY): 24 min  Charges:  $Therapeutic Activity: 23-37 mins                     Kainon Varady B. Migdalia Dk PT, DPT Acute Rehabilitation Services Pager [phone] Office [phone]    [place] 08/19/2020, 2:58  PM

## 2020-08-19 NOTE — Progress Notes (Signed)
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  Denies new or worsening visual complaints. 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  Would recommend 2 weeks if blood cultures from 4/7 continue to remain negative.  Fluconazole is a challenge given DDI with tacrolimus.  Follow-up TTE results Follow-up MRI right hand and wrist-has not been scheduled yet.  She is wondering why is it taking so long Left hand Xray  Consider Ortho eval pending xray left hand and MRI Rt hand/wrist  Ophthalmology evaluation -can be done in a nonurgent basis for candidemia Bactrim prophylaxis GI bleed  management per GI  Dr Tommy Medal is on call this weekend with questions. Otherwise, new ID team will follow on Monday   Rest of the management as per the primary team. Thank you for the consult. Please page with pertinent questions or concerns.  ______________________________________________________________________ Subjective patient seen and examined at the bedside.  Denies any new complaints.  She is wondering about when her MRI would be done.  She feels very weak   Vitals BP 132/82 (BP Location: Left Arm)   Pulse 66   Temp 97.9 F (36.6 C)   Resp 20   Ht '5\' 1"'$  (1.549 m)   Wt 57.6 kg   SpO2 100%   BMI 23.98 kg/m     Physical Exam Elderly female who looks chronically sick and frail No oral thrush Chest-normal lung sounds CVS normal heart sounds Abdomen soft and nontender MSK-right hand swelling, no signs of septic joint, limited range of motion of right wrist due to pain.  Left ring finger fracture Skin no obvious rashes   Pertinent Microbiology Results for orders placed or performed during the hospital encounter of 08/16/20  Culture, blood (routine x 2) Call MD if unable to obtain prior to antibiotics being given     Status: Abnormal (Preliminary result)   Collection Time: 08/16/20  3:50 PM   Specimen: BLOOD  Result Value Ref Range Status   Specimen Description BLOOD RIGHT ANTECUBITAL  Final   Special Requests   Final    BOTTLES DRAWN AEROBIC AND ANAEROBIC Blood Culture results may not be optimal due to an inadequate volume of blood received in culture bottles   Culture  Setup Time (A)  Final    YEAST AEROBIC BOTTLE ONLY CRITICAL RESULT CALLED TO, READ BACK BY AND VERIFIED WITH: PHARMD ABBOTT [id number] FCP Performed at [facility], [place] [address], [place], [place] [address]    Culture YEAST  Final   Report Status PENDING  Incomplete  Blood Culture ID Panel (Reflexed)     Status: Abnormal   Collection Time: 08/16/20  3:50 PM  Result Value Ref Range Status    Enterococcus faecalis NOT DETECTED NOT DETECTED Final   Enterococcus Faecium NOT DETECTED NOT DETECTED Final   Listeria monocytogenes NOT DETECTED NOT DETECTED Final   Staphylococcus species NOT DETECTED NOT DETECTED Final   Staphylococcus aureus (BCID) NOT DETECTED NOT DETECTED Final   Staphylococcus epidermidis NOT DETECTED NOT DETECTED Final   Staphylococcus lugdunensis NOT DETECTED NOT DETECTED Final   Streptococcus species NOT DETECTED NOT DETECTED Final   Streptococcus agalactiae NOT DETECTED NOT DETECTED Final   Streptococcus pneumoniae NOT DETECTED NOT DETECTED Final   Streptococcus pyogenes NOT DETECTED NOT DETECTED Final   A.calcoaceticus-baumannii NOT DETECTED NOT DETECTED Final   Bacteroides fragilis NOT DETECTED NOT DETECTED Final   Enterobacterales NOT DETECTED NOT DETECTED Final   Enterobacter cloacae complex NOT DETECTED NOT DETECTED Final   Escherichia coli NOT DETECTED NOT DETECTED Final   Klebsiella aerogenes NOT DETECTED NOT DETECTED Final   Klebsiella oxytoca NOT DETECTED NOT DETECTED Final   Klebsiella pneumoniae NOT DETECTED NOT DETECTED Final   Proteus species NOT DETECTED NOT DETECTED Final   Salmonella species NOT DETECTED NOT DETECTED Final   Serratia marcescens NOT DETECTED NOT DETECTED Final   Haemophilus influenzae NOT DETECTED NOT DETECTED Final   Neisseria meningitidis NOT DETECTED NOT DETECTED Final   Pseudomonas aeruginosa NOT DETECTED NOT DETECTED Final   Stenotrophomonas maltophilia NOT DETECTED NOT DETECTED Final   Candida albicans DETECTED (A) NOT DETECTED Final    Comment: CRITICAL RESULT CALLED TO, READ BACK BY AND VERIFIED WITH: PHARMD ABBOTT [id number] FCP    Candida auris NOT DETECTED NOT DETECTED Final   Candida glabrata NOT DETECTED NOT DETECTED Final   Candida krusei NOT DETECTED NOT DETECTED Final   Candida parapsilosis NOT DETECTED NOT DETECTED Final   Candida tropicalis NOT DETECTED NOT DETECTED Final   Cryptococcus  neoformans/gattii NOT DETECTED NOT DETECTED Final    Comment: Performed at [facility], [address], [place], [place] [address]  Culture, blood (routine x 2) Call MD if unable to obtain prior to antibiotics being given     Status: Abnormal (Preliminary result)   Collection Time: 08/16/20  3:52 PM   Specimen: BLOOD LEFT ARM  Result Value Ref Range Status   Specimen Description BLOOD LEFT ARM  Final   Special Requests   Final    BOTTLES DRAWN AEROBIC AND ANAEROBIC Blood Culture results may not be optimal due to an inadequate volume of blood received in culture bottles   Culture  Setup Time (A)  Final    YEAST AEROBIC BOTTLE ONLY CRITICAL VALUE NOTED.  VALUE IS CONSISTENT WITH PREVIOUSLY REPORTED AND CALLED VALUE. Performed at [facility], [place] [address] [place], [place] [address]    Culture YEAST  Final   Report Status PENDING  Incomplete  Culture, blood (  routine x 2)     Status: None (Preliminary result)   Collection Time: 08/18/20  7:51 AM   Specimen: BLOOD  Result Value Ref Range Status   Specimen Description BLOOD RIGHT ANTECUBITAL  Final   Special Requests   Final    BOTTLES DRAWN AEROBIC AND ANAEROBIC Blood Culture adequate volume   Culture   Final    NO GROWTH < 12 HOURS Performed at [facility], [address] [place], [place] [address]    Report Status PENDING  Incomplete  Culture, blood (routine x 2)     Status: None (Preliminary result)   Collection Time: 08/18/20  8:03 AM   Specimen: BLOOD LEFT WRIST  Result Value Ref Range Status   Specimen Description BLOOD LEFT WRIST  Final   Special Requests   Final    BOTTLES DRAWN AEROBIC AND ANAEROBIC Blood Culture adequate volume   Culture   Final    NO GROWTH < 12 HOURS Performed at [facility], [place] [address] [place], [place] [address]    Report Status PENDING  Incomplete     Pertinent Lab. CBC Latest Ref Rng & Units 08/19/2020 08/18/2020 08/17/2020  WBC 4.0 - 10.5 K/uL 3.0(L) 4.9 4.4   Hemoglobin 12.0 - 15.0 g/dL 8.4(L) 8.1(L) 7.1(L)  Hematocrit 36.0 - 46.0 % 26.2(L) 24.2(L) 22.2(L)  Platelets 150 - 400 K/uL 141(L) 132(L) 125(L)   CMP Latest Ref Rng & Units 08/19/2020 08/18/2020 08/17/2020  Glucose 70 - 99 mg/dL 122(H) 229(H) 200(H)  BUN 8 - 23 mg/dL 56(H) 59(H) 56(H)  Creatinine 0.44 - 1.00 mg/dL 3.44(H) 3.48(H) 3.54(H)  Sodium 135 - 145 mmol/L 135 132(L) 133(L)  Potassium 3.5 - 5.1 mmol/L 3.4(L) 4.2 4.9  Chloride 98 - 111 mmol/L 101 101 100  CO2 22 - 32 mmol/L '23 23 26  '$ Calcium 8.9 - 10.3 mg/dL 8.0(L) 7.7(L) 7.9(L)  Total Protein 6.5 - 8.1 g/dL 5.0(L) 5.0(L) -  Total Bilirubin 0.3 - 1.2 mg/dL 0.4 0.5 -  Alkaline Phos 38 - 126 U/L 62 67 -  AST 15 - 41 U/L 16 18 -  ALT 0 - 44 U/L 16 15 -    Pertinent Imaging today Plain films and CT images have been personally visualized and interpreted; radiology reports have been reviewed. Decision making incorporated into the Impression / Recommendations.  I have spent approx 30 minutes for this patient encounter including review of prior medical records, coordination of care  with greater than 50% of time being face to face/counseling and discussing diagnostics/treatment plan with the patient/family.  Electronically signed by:   Rosiland Oz, MD Infectious Disease Physician [facility] for Infectious Disease Pager: [phone]

## 2020-08-19 NOTE — Progress Notes (Signed)
Hypoglycemic Event  CBG: 67  Treatment: 4 oz juice/soda  Symptoms: Hungry  Follow-up CBG: Time: 0811 CBG Result: 70  Possible Reasons for Event: Inadequate meal intake  Comments/MD notified: Dr. Andreas Ohm

## 2020-08-19 NOTE — Progress Notes (Addendum)
PROGRESS NOTE                                                                             PROGRESS NOTE                                                                                                                                                                                                             Patient Demographics:    Ruth Riley, is a 75 y.o. female, DOB - [birth year], [id number]  Outpatient Primary MD for the patient is [name], MD    LOS - 2  Admit date - 08/16/2020    No chief complaint on file.      Brief Narrative     Ruth Riley is a 75 y.o. female with medical history significant of DM; HTN; and h/o renal transplant with current stage 4 CKD (baseline 3.7) with anemia (baseline 9) presenting to [facility] with confusion.  When asked what brought her to the hospital, the patient repeatedly returns to her litany of orthopedic complaints.  Most recently, she did fall and injure her R wrist and then fell again and injured her L hand.  She acknowledges some SOB and cough that appear to have started in the last week; she was feeling better after her COVID infection in early February.  Notes from [facility] indicate that the patient has had intermittent confusion and persistent lethargy with fever to 101.  She was recently started on oxycodone and prednisone and this was thought to be a possible cause for her AMS.   -Work-up in [facility] hospital showing her Hemoglobin dropped from 9 to 7.5 with stool for occult blood positive. Patient's stool was dark but clearly melanotic. No Gastroenterologist at [place] this week so requested transfer.      Subjective:    Sidney Ace today reports right wrist pain still present, but mildly improved, reports good appetite, denies fever or chills, no nausea or vomiting.     Assessment  & Plan :    Principal Problem:  Acute metabolic encephalopathy Active Problems:   Anemia in chronic  kidney disease   Diabetes mellitus type 2 in nonobese [facility])   Essential hypertension   H/O kidney transplant   Stage 4 chronic kidney disease (HCC)   Bilateral wrist pain   Multifocal pneumonia   History of COVID-19   Chronic back pain   Blood in the stool    Acute metabolic encephalopathy -Patient presenting with encephalopathy as evidenced by her confusion and lethargy -While the patient does appear to also have dementia, this is a change compared to her usual baseline mental status -This appears to be multifactorial and related to post-COVID PNA as well as possible GI bleed -Mentation back to baseline.   Multifocal PNA with h/o COVID-19 infection -Patient presenting with productive cough, mildly decreased oxygen saturation. -Chest x-ray and wrangle confirming subtle bibasilar opacities  -She was encouraged with incentive spirometry, flutter valve . -Continue with IV azithromycin and Rocephin . -She was encouraged to use incentive spirometry and flutter valve . -Steroid-dependent, continue with stress dose steroids.  Decrease dosing to IV hydrocortisone every 12 hours  Fungemia -Cardiomyopathy appreciated, currently on IV unresponding, followed by ID, thought to be related to GI bleed. -2D echo pending -She denies any visual complaints  Anemia, possible GI bleed -Hemoglobin this morning 7.1, down from 8.5, received 1 unit  PRBC 4/6, hemoglobin stable this morning. -GI Input greatly appreciated, plan for EGD/colonoscopy when she is more stable .  Stage 4 CKD, h/o renal transplant -Continue tacrolimus, change prednisone to stress dosed steroids -Continue Bicarb -Replete with IVF -Continue Bactrim 3x/week  DM -Continue Lantus -will cover with sensitive-scale SSI  HTN -Continue Coreg  HLD -Continue Crestor  Right Wrist pain -She has had recent falls and has B wrist pain -L 4th finger broken ,  -R hand appears to be more c/w carpal tunnel or nerve  impingement.   -   Continue with nocturnal splinting - MRI of wrist and hand without contrast are pending.  Back pain -I have reviewed this patient in the [place] Controlled Substances Reporting System.  Se is receiving medications from multiple providers but appears to be not be receiving regular prescriptions. -She is not at particularly high risk of opioid misuse, diversion, or overdose. -Continue Oxy IR prn -Continue Requip    SpO2: 95 %  Recent Labs  Lab 08/16/20 1556 08/17/20 0109 08/18/20 0232 08/19/20 0158  WBC 5.4 4.4 4.9 3.0*  PLT 134* 125* 132* 141*  PROCALCITON 1.10  --   --   --   AST 15  --  18 16  ALT 16  --  15 16  ALKPHOS 70  --  67 62  BILITOT 1.0  --  0.5 0.4  ALBUMIN 2.6*  --  2.0* 1.9*       ABG  No results found for: PHART, PCO2ART, PO2ART, HCO3, TCO2, ACIDBASEDEF, O2SAT        Condition - Extremely Guarded  Family Communication  :  None at bedside  Code Status :  DNR  Consults  :  none  Disposition Plan  :    Status is: Inpatient  Remains inpatient appropriate because:IV treatments appropriate due to intensity of illness or inability to take PO   Dispo: The patient is from: Home              Anticipated d/c is to: Home              Patient currently is not medically stable  to d/c.   Difficult to place patient No      DVT Prophylaxis  : SCDs   Lab Results  Component Value Date   PLT 141 (L) 08/19/2020    Diet :  Diet Order            Diet Carb Modified Fluid consistency: Thin; Room service appropriate? Yes  Diet effective now                  Inpatient Medications  Scheduled Meds: . azithromycin  500 mg Oral Daily  . carvedilol  25 mg Oral BID  . [START ON 08/20/2020] cefdinir  300 mg Oral Daily  . hydrocortisone sod succinate (SOLU-CORTEF) inj  50 mg Intravenous Q12H  . insulin aspart  0-9 Units Subcutaneous TID WC  . insulin glargine  10 Units Subcutaneous QHS  . pantoprazole  40 mg Oral BID  . rOPINIRole  3  mg Oral QHS  . rosuvastatin  20 mg Oral Daily  . sertraline  100 mg Oral QHS  . sodium bicarbonate  1,300 mg Oral BID  . sulfamethoxazole-trimethoprim  1 tablet Oral Once per day on Mon Wed Fri  . tacrolimus  1 mg Oral TID   Continuous Infusions: . sodium chloride 50 mL/hr at 08/18/20 1811  . anidulafungin 100 mg (08/19/20 1001)   PRN Meds:.acetaminophen **OR** acetaminophen, albuterol, calcium carbonate (dosed in mg elemental calcium), camphor-menthol **AND** hydrOXYzine, docusate sodium, feeding supplement (NEPRO CARB STEADY), guaiFENesin, hydrALAZINE, HYDROcodone-acetaminophen, ondansetron **OR** ondansetron (ZOFRAN) IV, oxyCODONE, sorbitol, zolpidem  Antibiotics  :    Anti-infectives (From admission, onward)   Start     Dose/Rate Route Frequency Ordered Stop   08/20/20 1000  cefdinir (OMNICEF) capsule 300 mg  Status:  Discontinued        300 mg Oral Daily 08/19/20 0918 08/19/20 0936   08/20/20 1000  cefdinir (OMNICEF) capsule 300 mg        300 mg Oral Daily 08/19/20 0936 08/22/20 0959   08/19/20 1015  azithromycin (ZITHROMAX) tablet 500 mg  Status:  Discontinued        500 mg Oral Daily 08/19/20 0916 08/19/20 0936   08/19/20 1015  azithromycin (ZITHROMAX) tablet 500 mg        500 mg Oral Daily 08/19/20 0936 08/22/20 0959   08/19/20 1000  anidulafungin (ERAXIS) 100 mg in sodium chloride 0.9 % 100 mL IVPB        100 mg 78 mL/hr over 100 Minutes Intravenous Daily 08/18/20 0247 09/01/20 0959   08/18/20 0400  anidulafungin (ERAXIS) 200 mg in sodium chloride 0.9 % 200 mL IVPB        200 mg 78 mL/hr over 200 Minutes Intravenous  Once 08/18/20 0247 08/18/20 0704   08/17/20 0900  sulfamethoxazole-trimethoprim (BACTRIM) 400-80 MG per tablet 1 tablet        1 tablet Oral Once per day on Mon Wed Fri 08/16/20 1430     08/17/20 0800  cefTRIAXone (ROCEPHIN) 2 g in sodium chloride 0.9 % 100 mL IVPB  Status:  Discontinued        2 g 200 mL/hr over 30 Minutes Intravenous Every 24 hours 08/16/20  1430 08/19/20 0918   08/17/20 0800  azithromycin (ZITHROMAX) 500 mg in sodium chloride 0.9 % 250 mL IVPB  Status:  Discontinued        500 mg 250 mL/hr over 60 Minutes Intravenous Every 24 hours 08/16/20 1430 08/19/20 [place] Krista Som  M.D on 08/19/2020 at 3:02 PM  To page go to [email or web address]   [facility] -  Office  [phone]     Objective:   Vitals:   08/18/20 1954 08/19/20 0334 08/19/20 0432 08/19/20 1328  BP: (!) 197/97 (!) 164/88 132/82 135/85  Pulse: 63 66  73  Resp: '18 20  16  '$ Temp: 98 F (36.7 C) 97.9 F (36.6 C)  98.5 F (36.9 C)  TempSrc:      SpO2:  100%  95%  Weight:      Height:        Wt Readings from Last 3 Encounters:  08/16/20 57.6 kg  08/08/20 56.7 kg  05/26/20 56.8 kg     Intake/Output Summary (Last 24 hours) at 08/19/2020 1502 Last data filed at 08/19/2020 1234 Gross per 24 hour  Intake 1360.27 ml  Output --  Net 1360.27 ml     Physical Exam  Awake Alert, Oriented X 3, easily distracted, no new F.N deficits, Normal affect Symmetrical Chest wall movement, Good air movement bilaterally, CTAB RRR,No Gallops,Rubs or new Murmurs, No Parasternal Heave +ve B.Sounds, Abd Soft, No tenderness, No rebound - guarding or rigidity. 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    Data Review:    CBC Recent Labs  Lab 08/16/20 1556 08/17/20 0109 08/18/20 0232 08/19/20 0158  WBC 5.4 4.4 4.9 3.0*  HGB 8.5* 7.1* 8.1* 8.4*  HCT 26.5* 22.2* 24.2* 26.2*  PLT 134* 125* 132* 141*  MCV 93.3 93.3 86.7 87.0  MCH 29.9 29.8 29.0 27.9  MCHC 32.1 32.0 33.5 32.1  RDW 16.5* 16.5* 19.7* 19.2*  LYMPHSABS 0.5*  --   --   --   MONOABS 0.2  --   --   --   EOSABS 0.1  --   --   --   BASOSABS 0.0  --   --   --     Recent Labs  Lab 08/16/20 1556 08/17/20 0109 08/18/20 0232 08/19/20 0158  NA 135 133* 132* 135  K 4.8 4.9 4.2 3.4*  CL 99 100 101  101  CO2 '27 26 23 23  '$ GLUCOSE 104* 200* 229* 122*  BUN 56* 56* 59* 56*  CREATININE 3.77* 3.54* 3.48* 3.44*  CALCIUM 8.9 7.9* 7.7* 8.0*  AST 15  --  18 16  ALT 16  --  15 16  ALKPHOS 70  --  67 62  BILITOT 1.0  --  0.5 0.4  ALBUMIN 2.6*  --  2.0* 1.9*  PROCALCITON 1.10  --   --   --   TSH 2.903  --   --   --     ------------------------------------------------------------------------------------------------------------------ No results for input(s): CHOL, HDL, LDLCALC, TRIG, CHOLHDL, LDLDIRECT in the last 72 hours.  No results found for: HGBA1C ------------------------------------------------------------------------------------------------------------------ Recent Labs    08/16/20 1556  TSH 2.903    Cardiac Enzymes No results for input(s): CKMB, TROPONINI, MYOGLOBIN in the last 168 hours.  Invalid input(s): CK ------------------------------------------------------------------------------------------------------------------ No results found for: BNP  Micro Results Recent Results (from the past 240 hour(s))  Culture, blood (routine x 2) Call MD if unable to obtain prior to antibiotics being given     Status: Abnormal (Preliminary result)   Collection Time: 08/16/20  3:50 PM   Specimen: BLOOD  Result Value Ref Range Status   Specimen Description BLOOD RIGHT ANTECUBITAL  Final   Special Requests   Final  BOTTLES DRAWN AEROBIC AND ANAEROBIC Blood Culture results may not be optimal due to an inadequate volume of blood received in culture bottles   Culture  Setup Time (A)  Final    YEAST AEROBIC BOTTLE ONLY CRITICAL RESULT CALLED TO, READ BACK BY AND VERIFIED WITH: PHARMD ABBOTT OA:[id number] FCP Performed at [facility], [place] [address] [place], [place] [address]    Culture CANDIDA ALBICANS (A)  Final   Report Status PENDING  Incomplete  Blood Culture ID Panel (Reflexed)     Status: Abnormal   Collection Time: 08/16/20  3:50 PM  Result Value Ref Range Status    Enterococcus faecalis NOT DETECTED NOT DETECTED Final   Enterococcus Faecium NOT DETECTED NOT DETECTED Final   Listeria monocytogenes NOT DETECTED NOT DETECTED Final   Staphylococcus species NOT DETECTED NOT DETECTED Final   Staphylococcus aureus (BCID) NOT DETECTED NOT DETECTED Final   Staphylococcus epidermidis NOT DETECTED NOT DETECTED Final   Staphylococcus lugdunensis NOT DETECTED NOT DETECTED Final   Streptococcus species NOT DETECTED NOT DETECTED Final   Streptococcus agalactiae NOT DETECTED NOT DETECTED Final   Streptococcus pneumoniae NOT DETECTED NOT DETECTED Final   Streptococcus pyogenes NOT DETECTED NOT DETECTED Final   A.calcoaceticus-baumannii NOT DETECTED NOT DETECTED Final   Bacteroides fragilis NOT DETECTED NOT DETECTED Final   Enterobacterales NOT DETECTED NOT DETECTED Final   Enterobacter cloacae complex NOT DETECTED NOT DETECTED Final   Escherichia coli NOT DETECTED NOT DETECTED Final   Klebsiella aerogenes NOT DETECTED NOT DETECTED Final   Klebsiella oxytoca NOT DETECTED NOT DETECTED Final   Klebsiella pneumoniae NOT DETECTED NOT DETECTED Final   Proteus species NOT DETECTED NOT DETECTED Final   Salmonella species NOT DETECTED NOT DETECTED Final   Serratia marcescens NOT DETECTED NOT DETECTED Final   Haemophilus influenzae NOT DETECTED NOT DETECTED Final   Neisseria meningitidis NOT DETECTED NOT DETECTED Final   Pseudomonas aeruginosa NOT DETECTED NOT DETECTED Final   Stenotrophomonas maltophilia NOT DETECTED NOT DETECTED Final   Candida albicans DETECTED (A) NOT DETECTED Final    Comment: CRITICAL RESULT CALLED TO, READ BACK BY AND VERIFIED WITH: PHARMD ABBOTT OA:[id number] FCP    Candida auris NOT DETECTED NOT DETECTED Final   Candida glabrata NOT DETECTED NOT DETECTED Final   Candida krusei NOT DETECTED NOT DETECTED Final   Candida parapsilosis NOT DETECTED NOT DETECTED Final   Candida tropicalis NOT DETECTED NOT DETECTED Final   Cryptococcus  neoformans/gattii NOT DETECTED NOT DETECTED Final    Comment: Performed at [facility], [address], [place], [place] [address]  Culture, blood (routine x 2) Call MD if unable to obtain prior to antibiotics being given     Status: Abnormal (Preliminary result)   Collection Time: 08/16/20  3:52 PM   Specimen: BLOOD LEFT ARM  Result Value Ref Range Status   Specimen Description BLOOD LEFT ARM  Final   Special Requests   Final    BOTTLES DRAWN AEROBIC AND ANAEROBIC Blood Culture results may not be optimal due to an inadequate volume of blood received in culture bottles   Culture  Setup Time (A)  Final    YEAST AEROBIC BOTTLE ONLY CRITICAL VALUE NOTED.  VALUE IS CONSISTENT WITH PREVIOUSLY REPORTED AND CALLED VALUE.    Culture   Final    YEAST IDENTIFICATION TO FOLLOW Performed at [facility], [place] [address] [place], [place] [address]    Report Status PENDING  Incomplete  Culture, blood (routine  x 2)     Status: None (Preliminary result)   Collection Time: 08/18/20  7:51 AM   Specimen: BLOOD  Result Value Ref Range Status   Specimen Description BLOOD RIGHT ANTECUBITAL  Final   Special Requests   Final    BOTTLES DRAWN AEROBIC AND ANAEROBIC Blood Culture adequate volume   Culture   Final    NO GROWTH < 24 HOURS Performed at [facility], [address] [place], [place] [address]    Report Status PENDING  Incomplete  Culture, blood (routine x 2)     Status: None (Preliminary result)   Collection Time: 08/18/20  8:03 AM   Specimen: BLOOD LEFT WRIST  Result Value Ref Range Status   Specimen Description BLOOD LEFT WRIST  Final   Special Requests   Final    BOTTLES DRAWN AEROBIC AND ANAEROBIC Blood Culture adequate volume   Culture   Final    NO GROWTH < 24 HOURS Performed at [facility], [place] [address] [place], [place] [address]    Report Status PENDING  Incomplete    Radiology Reports DG Hand 2 View Left  Result Date: 08/19/2020 CLINICAL DATA:   Pain EXAM: LEFT HAND - 2 VIEW COMPARISON:  None. FINDINGS: Frontal and lateral views obtained. There has been previous apparent removal of the trapezium bone. Postoperative changes noted in the proximal second metacarpal and trapezoid bones. Minimal calcification is noted in the expected location of the trapezium bone. No acute fracture or dislocation. Bones are osteoporotic. There is marked narrowing of all D IP joints with spurring in all of these joints. There is moderate narrowing in the MCP and PIP joints. No erosion or periostitis evident. Bones are somewhat osteopenic IMPRESSION: 1. Apparent previous removal of the trapezium bone with a few tiny calcifications in this area. 2. 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 No erosions. 3.  No acute fracture or dislocation. 4.  Bones osteoporotic. Electronically Signed   By: Lowella Grip III M.D.   On: 08/19/2020 13:53   DG Chest Port 1 View  Result Date: 08/17/2020 CLINICAL DATA:  Dyspnea. EXAM: PORTABLE CHEST 1 VIEW COMPARISON:  August 15, 2020. FINDINGS: Stable cardiomediastinal silhouette. No pneumothorax or pleural effusion is noted. No consolidative process is noted. Bony thorax is unremarkable. IMPRESSION: No active disease. Electronically Signed   By: Marijo Conception M.D.   On: 08/17/2020 08:46

## 2020-08-19 NOTE — Plan of Care (Signed)

## 2020-08-19 NOTE — Progress Notes (Addendum)
2D echocardiogram completed.  08/19/2020 3:09 PM Kelby Aline., MHA, RVT, RDCS, RDMS

## 2020-08-20 DIAGNOSIS — D500 Iron deficiency anemia secondary to blood loss (chronic): Secondary | ICD-10-CM

## 2020-08-20 DIAGNOSIS — R195 Other fecal abnormalities: Secondary | ICD-10-CM | POA: Diagnosis not present

## 2020-08-20 DIAGNOSIS — N189 Chronic kidney disease, unspecified: Secondary | ICD-10-CM | POA: Diagnosis not present

## 2020-08-20 DIAGNOSIS — I10 Essential (primary) hypertension: Secondary | ICD-10-CM

## 2020-08-20 DIAGNOSIS — D631 Anemia in chronic kidney disease: Secondary | ICD-10-CM | POA: Diagnosis not present

## 2020-08-20 DIAGNOSIS — N185 Chronic kidney disease, stage 5: Secondary | ICD-10-CM | POA: Diagnosis not present

## 2020-08-20 DIAGNOSIS — M545 Low back pain, unspecified: Secondary | ICD-10-CM | POA: Diagnosis not present

## 2020-08-20 DIAGNOSIS — G9341 Metabolic encephalopathy: Secondary | ICD-10-CM | POA: Diagnosis not present

## 2020-08-20 LAB — GLUCOSE, CAPILLARY
Glucose-Capillary: 148 mg/dL — ABNORMAL HIGH (ref 70–99)
Glucose-Capillary: 119 mg/dL — ABNORMAL HIGH (ref 70–99)
Glucose-Capillary: 194 mg/dL — ABNORMAL HIGH (ref 70–99)
Glucose-Capillary: 170 mg/dL — ABNORMAL HIGH (ref 70–99)

## 2020-08-20 LAB — BASIC METABOLIC PANEL
Anion gap: 7 (ref 5–15)
BUN: 53 mg/dL — ABNORMAL HIGH (ref 8–23)
CO2: 22 mmol/L (ref 22–32)
Calcium: 7.6 mg/dL — ABNORMAL LOW (ref 8.9–10.3)
Chloride: 104 mmol/L (ref 98–111)
Creatinine, Ser: 3.17 mg/dL — ABNORMAL HIGH (ref 0.44–1.00)
GFR, Estimated: 15 mL/min — ABNORMAL LOW (ref >60)
Glucose, Bld: 189 mg/dL — ABNORMAL HIGH (ref 70–99)
Potassium: 3.7 mmol/L (ref 3.5–5.1)
Sodium: 133 mmol/L — ABNORMAL LOW (ref 135–145)

## 2020-08-20 LAB — CBC
HCT: 26.2 % — ABNORMAL LOW (ref 36.0–46.0)
Hemoglobin: 8.5 g/dL — ABNORMAL LOW (ref 12.0–15.0)
MCH: 28.6 pg (ref 26.0–34.0)
MCHC: 32.4 g/dL (ref 30.0–36.0)
MCV: 88.2 fL (ref 80.0–100.0)
Platelets: 128 10*3/uL — ABNORMAL LOW (ref 150–400)
RBC: 2.97 MIL/uL — ABNORMAL LOW (ref 3.87–5.11)
RDW: 18.7 % — ABNORMAL HIGH (ref 11.5–15.5)
WBC: 3.3 10*3/uL — ABNORMAL LOW (ref 4.0–10.5)
nRBC: 0 % (ref 0.0–0.2)

## 2020-08-20 MED ORDER — PREDNISONE 5 MG PO TABS
10.0000 mg | ORAL_TABLET | Freq: Every day | ORAL | Status: DC
  Administered 2020-08-21 – 2020-09-01 (×12): 10 mg via ORAL
  Filled 2020-08-20 (×12): qty 2

## 2020-08-20 NOTE — Plan of Care (Signed)
  Problem: Education: Goal: Knowledge of General Education information will improve Description: Including pain rating scale, medication(s)/side effects and non-pharmacologic comfort measures Outcome: Progressing   Problem: Health Behavior/Discharge Planning: Goal: Ability to manage health-related needs will improve Outcome: Progressing   Problem: Clinical Measurements: Goal: Ability to maintain clinical measurements within normal limits will improve Outcome: Progressing Goal: Will remain free from infection Outcome: Progressing Goal: Diagnostic test results will improve Outcome: Progressing Goal: Respiratory complications will improve Outcome: Progressing Goal: Cardiovascular complication will be avoided Outcome: Progressing   Problem: Activity: Goal: Risk for activity intolerance will decrease Outcome: Progressing   Problem: Nutrition: Goal: Adequate nutrition will be maintained Outcome: Progressing   Problem: Coping: Goal: Level of anxiety will decrease Outcome: Progressing   Problem: Elimination: Goal: Will not experience complications related to bowel motility Outcome: Progressing Goal: Will not experience complications related to urinary retention Outcome: Progressing   Problem: Pain Managment: Goal: General experience of comfort will improve Outcome: Progressing   Problem: Safety: Goal: Ability to remain free from injury will improve Outcome: Progressing   Problem: Skin Integrity: Goal: Risk for impaired skin integrity will decrease Outcome: Progressing   Problem: Education: Goal: Ability to identify signs and symptoms of gastrointestinal bleeding will improve Outcome: Progressing   Problem: Fluid Volume: Goal: Will show no signs and symptoms of excessive bleeding Outcome: Progressing   Problem: Bowel/Gastric: Goal: Will show no signs and symptoms of gastrointestinal bleeding Outcome: Progressing   Problem: Clinical Measurements: Goal:  Complications related to the disease process, condition or treatment will be avoided or minimized Outcome: Progressing

## 2020-08-20 NOTE — Progress Notes (Signed)
PROGRESS NOTE                                                                             PROGRESS NOTE                                                                                                                                                                                                             Patient Demographics:    Ruth Riley, is a 75 y.o. female, DOB - 14-Oct-1945, [id number]  Outpatient Primary MD for the patient is [name], MD    LOS - 3  Admit date - 08/16/2020    No chief complaint on file.      Brief Narrative     Ruth Riley is a 75 y.o. female with medical history significant of DM; HTN; and h/o renal transplant with current stage 4 CKD (baseline 3.7) with anemia (baseline 9) presenting to [facility] with confusion.  When asked what brought her to the hospital, the patient repeatedly returns to her litany of orthopedic complaints.  Most recently, she did fall and injure her R wrist and then fell again and injured her L hand.  She acknowledges some SOB and cough that appear to have started in the last week; she was feeling better after her COVID infection in early February. -She was noted to have Hemoccult-positive stools with hemoglobin dropped from 9-7.5, given lack of GI coverage at [facility] she was transferred to [facility]. -She required 1 unit PRBC transfusion since admission, with no further recurrence of GI bleed, her main complaint was right wrist/hand pain, as well her blood cultures on admissions were significant for anemia for which she is being followed by ID.   Subjective:    Sidney Ace today still reports of her right hand/wrist pain, swelling has subsided with right arm elevation, no fever, no chills, no nausea or vomiting.     Assessment  & Plan :    Principal Problem:   Acute metabolic encephalopathy Active Problems:   Anemia in  chronic kidney disease   Diabetes mellitus type 2  in nonobese [facility])   Essential hypertension   H/O kidney transplant   Stage 4 chronic kidney disease (HCC)   Bilateral wrist pain   Multifocal pneumonia   History of COVID-19   Chronic back pain   Blood in the stool    Acute metabolic encephalopathy -Patient presenting with encephalopathy as evidenced by her confusion and lethargy -While the patient does appear to also have dementia, this is a change compared to her usual baseline mental status -This appears to be multifactorial and related to PNA as well as possible GI bleed -Mentation back to baseline.  Multifocal PNA with h/o COVID-19 infection -Patient presenting with productive cough, mildly decreased oxygen saturation. -Chest x-ray at [facility] confirming subtle bibasilar opacities  -She was encouraged with incentive spirometry, flutter valve . -Treated with IV azithromycin and Rocephin . -She was encouraged to use incentive spirometry and flutter valve .  Fungemia -Cardiomyopathy appreciated, currently on IV unresponding, followed by ID, thought to be related to GI bleed. -2D echo with EF of 70%, no shunt, no vegetation could be seen. -She denies any visual complaints -On Anidulafungin , she will need total of 14 days.  Anemia, possible GI bleed -Hemoglobin this morning 7.1, down from 8.5, received 1 unit  PRBC 4/6, hemoglobin stable this morning. -GI Input greatly appreciated, plan for EGD/colonoscopy when she is more stable .  Stage 4 CKD, h/o renal transplant -Continue tacrolimus, initially on stress dose steroids, now back on prednisone. 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 Tears of the TFCC articular disc and foveal component of the triangular ligament. -   Continue with nocturnal splinting, and right arm elevation to reduce swelling.  Back pain -continue home regmine    SpO2: 98 %  Recent Labs  Lab 08/16/20 1556 08/17/20 0109 08/18/20 0232 08/19/20 0158 08/20/20 0030  WBC 5.4 4.4 4.9 3.0* 3.3*  PLT 134* 125* 132* 141* 128*  PROCALCITON 1.10  --   --   --   --   AST 15  --  18 16  --   ALT 16  --  15 16  --   ALKPHOS 70  --  67 62  --   BILITOT 1.0  --  0.5 0.4  --   ALBUMIN 2.6*  --  2.0* 1.9*  --        ABG  No results found for: PHART, PCO2ART, PO2ART, HCO3, TCO2, ACIDBASEDEF, O2SAT     Condition - Extremely Guarded  Family Communication  :  None at bedside  Code Status :  DNR  Consults  :  none  Disposition Plan  :    Status is: Inpatient  Remains inpatient appropriate because:IV treatments appropriate due to intensity of illness or inability to take PO   Dispo: The patient is from: Home              Anticipated d/c is to: Home              Patient currently is not medically stable to d/c.  Difficult to place patient No      DVT Prophylaxis  : SCDs   Lab Results  Component Value Date   PLT 128 (L) 08/20/2020    Diet :  Diet Order            Diet Carb Modified Fluid consistency: Thin; Room service appropriate? Yes  Diet effective now                  Inpatient Medications  Scheduled Meds: . azithromycin  500 mg Oral Daily  . carvedilol  25 mg Oral BID  . cefdinir  300 mg Oral Daily  . hydrocortisone sod succinate (SOLU-CORTEF) inj  50 mg Intravenous Q12H  . insulin aspart  0-9 Units Subcutaneous TID WC  . insulin glargine  10 Units Subcutaneous QHS  . pantoprazole  40 mg Oral BID  . rOPINIRole  3 mg Oral QHS  . rosuvastatin  20 mg Oral Daily  . sertraline  100  mg Oral QHS  . sodium bicarbonate  1,300 mg Oral BID  . sulfamethoxazole-trimethoprim  1 tablet Oral Once per day on Mon Wed Fri  . tacrolimus  1 mg Oral TID   Continuous Infusions: . sodium chloride 50 mL/hr at 08/19/20 1744  . anidulafungin 100 mg (08/20/20 0925)   PRN Meds:.acetaminophen **OR** acetaminophen, albuterol, calcium carbonate (dosed in mg elemental calcium), camphor-menthol **AND** hydrOXYzine, docusate sodium, feeding supplement (NEPRO CARB STEADY), guaiFENesin, hydrALAZINE, HYDROcodone-acetaminophen, ondansetron **OR** ondansetron (ZOFRAN) IV, oxyCODONE, sorbitol, zolpidem  Antibiotics  :    Anti-infectives (From admission, onward)   Start     Dose/Rate Route Frequency Ordered Stop   08/20/20 1000  cefdinir (OMNICEF) capsule 300 mg  Status:  Discontinued        300 mg Oral Daily 08/19/20 0918 08/19/20 0936   08/20/20 1000  cefdinir (OMNICEF) capsule 300 mg        300 mg Oral Daily 08/19/20 0936 08/22/20 0959   08/19/20 1015  azithromycin (ZITHROMAX) tablet 500 mg  Status:  Discontinued        500 mg Oral Daily 08/19/20 0916 08/19/20 0936   08/19/20 1015  azithromycin (ZITHROMAX) tablet 500 mg        500 mg Oral Daily 08/19/20 0936 08/22/20 0959   08/19/20 1000  anidulafungin (ERAXIS) 100 mg in sodium chloride 0.9 % 100 mL IVPB        100 mg 78 mL/hr over 100 Minutes Intravenous Daily 08/18/20 0247 09/01/20 0959   08/18/20 0400  anidulafungin (ERAXIS) 200 mg in sodium chloride 0.9 % 200 mL IVPB        200 mg 78 mL/hr over 200 Minutes Intravenous  Once 08/18/20 0247 08/18/20 0704   08/17/20 0900  sulfamethoxazole-trimethoprim (BACTRIM) 400-80 MG per tablet 1 tablet        1 tablet Oral Once per day on Mon Wed Fri 08/16/20 1430     08/17/20 0800  cefTRIAXone (ROCEPHIN) 2 g in sodium chloride 0.9 % 100 mL IVPB  Status:  Discontinued        2 g 200 mL/hr over 30 Minutes Intravenous Every 24 hours 08/16/20 1430 08/19/20 0918   08/17/20 0800  azithromycin (ZITHROMAX) 500  mg in sodium chloride 0.9 % 250 mL IVPB  Status:  Discontinued        500 mg 250 mL/hr over 60 Minutes Intravenous Every 24 hours 08/16/20 1430 08/19/20 0916         Emeline Gins Saira Kramme M.D on 08/20/2020 at 2:09 PM  To page go to [email or web address]   [facility] -  Office  [phone]     Objective:   Vitals:   08/19/20 2038 08/20/20 0339 08/20/20 0421 08/20/20 0558  BP: (!) 147/73  (!) 189/91 (!) 142/82  Pulse: 72  72   Resp: 16  18   Temp: 97.8 F (36.6 C)  98 F (36.7 C)   TempSrc: Oral     SpO2: 99%  98%   Weight:  57 kg    Height:        Wt Readings from Last 3 Encounters:  08/20/20 57 kg  08/08/20 56.7 kg  05/26/20 56.8 kg     Intake/Output Summary (Last 24 hours) at 08/20/2020 1409 Last data filed at 08/20/2020 0915 Gross per 24 hour  Intake 1558.24 ml  Output --  Net 1558.24 ml     Physical Exam  Awake Alert, easily distracted, no new F.N deficits, Normal affect Symmetrical Chest wall movement, Good air movement bilaterally, CTAB RRR,No Gallops,Rubs or new Murmurs, No Parasternal Heave +ve B.Sounds, Abd Soft, No tenderness, No rebound - guarding or rigidity. 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    Data Review:    CBC Recent Labs  Lab 08/16/20 1556 08/17/20 0109 08/18/20 0232 08/19/20 0158 08/20/20 0030  WBC 5.4 4.4 4.9 3.0* 3.3*  HGB 8.5* 7.1* 8.1* 8.4* 8.5*  HCT 26.5* 22.2* 24.2* 26.2* 26.2*  PLT 134* 125* 132* 141* 128*  MCV 93.3 93.3 86.7 87.0 88.2  MCH 29.9 29.8 29.0 27.9 28.6  MCHC 32.1 32.0 33.5 32.1 32.4  RDW 16.5* 16.5* 19.7* 19.2* 18.7*  LYMPHSABS 0.5*  --   --   --   --   MONOABS 0.2  --   --   --   --   EOSABS 0.1  --   --   --   --   BASOSABS 0.0  --   --   --   --     Recent Labs  Lab 08/16/20 1556 08/17/20 0109 08/18/20 0232 08/19/20 0158 08/20/20 0030  NA 135 133* 132* 135 133*  K 4.8 4.9 4.2 3.4* 3.7  CL 99 100  101 101 104  CO2 '27 26 23 23 22  '$ GLUCOSE 104* 200* 229* 122* 189*  BUN 56* 56* 59* 56* 53*  CREATININE 3.77* 3.54* 3.48* 3.44* 3.17*  CALCIUM 8.9 7.9* 7.7* 8.0* 7.6*  AST 15  --  18 16  --   ALT 16  --  15 16  --   ALKPHOS 70  --  67 62  --   BILITOT 1.0  --  0.5 0.4  --   ALBUMIN 2.6*  --  2.0* 1.9*  --   PROCALCITON 1.10  --   --   --   --   TSH 2.903  --   --   --   --     ------------------------------------------------------------------------------------------------------------------ No results for input(s): CHOL, HDL, LDLCALC, TRIG, CHOLHDL, LDLDIRECT in the last 72 hours.  No results found for: HGBA1C ------------------------------------------------------------------------------------------------------------------ No results for input(s): TSH, T4TOTAL, T3FREE, THYROIDAB in the last 72 hours.  Invalid input(s): FREET3  Cardiac Enzymes No results for input(s): CKMB, TROPONINI, MYOGLOBIN in the last 168 hours.  Invalid input(s): CK ------------------------------------------------------------------------------------------------------------------ No results found for: BNP  Micro Results Recent Results (from the past 240 hour(s))  Culture, blood (routine x 2) Call MD if unable to obtain prior to antibiotics  being given     Status: Abnormal (Preliminary result)   Collection Time: 08/16/20  3:50 PM   Specimen: BLOOD  Result Value Ref Range Status   Specimen Description BLOOD RIGHT ANTECUBITAL  Final   Special Requests   Final    BOTTLES DRAWN AEROBIC AND ANAEROBIC Blood Culture results may not be optimal due to an inadequate volume of blood received in culture bottles   Culture  Setup Time (A)  Final    YEAST AEROBIC BOTTLE ONLY CRITICAL RESULT CALLED TO, READ BACK BY AND VERIFIED WITH: PHARMD ABBOTT [id number] FCP Performed at [facility], [place] [address] [place], [place] [address]    Culture CANDIDA ALBICANS (A)  Final   Report Status PENDING  Incomplete   Blood Culture ID Panel (Reflexed)     Status: Abnormal   Collection Time: 08/16/20  3:50 PM  Result Value Ref Range Status   Enterococcus faecalis NOT DETECTED NOT DETECTED Final   Enterococcus Faecium NOT DETECTED NOT DETECTED Final   Listeria monocytogenes NOT DETECTED NOT DETECTED Final   Staphylococcus species NOT DETECTED NOT DETECTED Final   Staphylococcus aureus (BCID) NOT DETECTED NOT DETECTED Final   Staphylococcus epidermidis NOT DETECTED NOT DETECTED Final   Staphylococcus lugdunensis NOT DETECTED NOT DETECTED Final   Streptococcus species NOT DETECTED NOT DETECTED Final   Streptococcus agalactiae NOT DETECTED NOT DETECTED Final   Streptococcus pneumoniae NOT DETECTED NOT DETECTED Final   Streptococcus pyogenes NOT DETECTED NOT DETECTED Final   A.calcoaceticus-baumannii NOT DETECTED NOT DETECTED Final   Bacteroides fragilis NOT DETECTED NOT DETECTED Final   Enterobacterales NOT DETECTED NOT DETECTED Final   Enterobacter cloacae complex NOT DETECTED NOT DETECTED Final   Escherichia coli NOT DETECTED NOT DETECTED Final   Klebsiella aerogenes NOT DETECTED NOT DETECTED Final   Klebsiella oxytoca NOT DETECTED NOT DETECTED Final   Klebsiella pneumoniae NOT DETECTED NOT DETECTED Final   Proteus species NOT DETECTED NOT DETECTED Final   Salmonella species NOT DETECTED NOT DETECTED Final   Serratia marcescens NOT DETECTED NOT DETECTED Final   Haemophilus influenzae NOT DETECTED NOT DETECTED Final   Neisseria meningitidis NOT DETECTED NOT DETECTED Final   Pseudomonas aeruginosa NOT DETECTED NOT DETECTED Final   Stenotrophomonas maltophilia NOT DETECTED NOT DETECTED Final   Candida albicans DETECTED (A) NOT DETECTED Final    Comment: CRITICAL RESULT CALLED TO, READ BACK BY AND VERIFIED WITH: PHARMD ABBOTT [id number] FCP    Candida auris NOT DETECTED NOT DETECTED Final   Candida glabrata NOT DETECTED NOT DETECTED Final   Candida krusei NOT DETECTED NOT DETECTED Final   Candida  parapsilosis NOT DETECTED NOT DETECTED Final   Candida tropicalis NOT DETECTED NOT DETECTED Final   Cryptococcus neoformans/gattii NOT DETECTED NOT DETECTED Final    Comment: Performed at [facility], [address] [place], [place] [address]  Culture, blood (routine x 2) Call MD if unable to obtain prior to antibiotics being given     Status: Abnormal (Preliminary result)   Collection Time: 08/16/20  3:52 PM   Specimen: BLOOD LEFT ARM  Result Value Ref Range Status   Specimen Description BLOOD LEFT ARM  Final   Special Requests   Final    BOTTLES DRAWN AEROBIC AND ANAEROBIC Blood Culture results may not be optimal due to an inadequate volume of blood received in culture bottles   Culture  Setup Time (A)  Final    YEAST AEROBIC BOTTLE ONLY CRITICAL VALUE NOTED.  VALUE  IS CONSISTENT WITH PREVIOUSLY REPORTED AND CALLED VALUE.    Culture   Final    YEAST IDENTIFICATION TO FOLLOW Performed at [facility], [place] [address] [place], [place] [address]    Report Status PENDING  Incomplete  Culture, blood (routine x 2)     Status: None (Preliminary result)   Collection Time: 08/18/20  7:51 AM   Specimen: BLOOD  Result Value Ref Range Status   Specimen Description BLOOD RIGHT ANTECUBITAL  Final   Special Requests   Final    BOTTLES DRAWN AEROBIC AND ANAEROBIC Blood Culture adequate volume   Culture   Final    NO GROWTH 2 DAYS Performed at [facility], [place] [address], [place], [place] [address]    Report Status PENDING  Incomplete  Culture, blood (routine x 2)     Status: None (Preliminary result)   Collection Time: 08/18/20  8:03 AM   Specimen: BLOOD LEFT WRIST  Result Value Ref Range Status   Specimen Description BLOOD LEFT WRIST  Final   Special Requests   Final    BOTTLES DRAWN AEROBIC AND ANAEROBIC Blood Culture adequate volume   Culture   Final    NO GROWTH 2 DAYS Performed at [facility], [place] [address], [place], [place] [address]    Report Status  PENDING  Incomplete    Radiology Reports MR HAND RIGHT WO CONTRAST  Result Date: 08/20/2020 CLINICAL DATA:  Right hand and wrist pain.  Recent falls. EXAM: MRI OF THE RIGHT HAND WITHOUT CONTRAST; MRI OF THE RIGHT WRIST WITHOUT CONTRAST TECHNIQUE: Multiplanar, multisequence MR imaging of the right hand and wrist was performed. No intravenous contrast was administered. COMPARISON:  Right hand x-rays dated August 01, 2020. FINDINGS: Ligaments: Chronic complete tear of the scapholunate ligament with widening of the scapholunate interval. The lunotriquetral ligament is intact. Triangular fibrocartilage: There is a small partial tear of the articular disc ulnar surface. The foveal component of the triangular ligament is completely torn. Slight ulnar minus variance. Tendons: Flexor and extensor tendons are intact. Large amount of fluid in the flexor tendon sheaths. Mild extensor carpi ulnaris tendinosis with small amount of fluid in the tendon sheath. Carpal tunnel/median nerve: Unremarkable. Guyon's canal: Unremarkable. Joint/cartilage: Scattered cartilage loss about the wrist with large areas of full-thickness cartilage loss in the radioscaphoid and scaphotrapeziotrapezoid joints. Scattered small carpal bone erosions. Additional small erosions of the second and third MCP joints with synovial thickening. Large radiocarpal and midcarpal joint effusions.Small distal radioulnar joint effusion. 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 Moderate first and third MCP joint osteoarthritis. Moderate to severe DIP joint osteoarthritis. Bones/carpal alignment: Chronic widening of the scapholunate interval. Early proximal migration of the capitate.No suspicious marrow signal abnormality. No acute fracture or dislocation. Other: Severe soft tissue swelling of the wrist and hand. No fluid collection. IMPRESSION: 1. No acute abnormality.  No fracture or evidence of osteomyelitis. 2. 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 3. Tears of the TFCC articular disc and foveal component of the triangular ligament. 4. Early SLAC wrist. 5. 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 6. Severe soft tissue swelling of the wrist and hand. No fluid collection. Electronically Signed   By: Titus Dubin M.D.   On: 08/20/2020 09:24  DG Hand 2 View Left  Result Date: 08/19/2020 CLINICAL DATA:  Pain EXAM: LEFT HAND - 2 VIEW COMPARISON:  None. FINDINGS: Frontal and lateral views obtained. There has been previous apparent removal of the trapezium bone. Postoperative changes noted in the proximal second metacarpal and trapezoid bones. Minimal calcification is noted in the expected location of the trapezium bone. No acute fracture or dislocation. Bones are osteoporotic. There is marked narrowing of all D IP joints with spurring in all of these joints. There is moderate narrowing in the MCP and PIP joints. No erosion or periostitis evident. Bones are somewhat osteopenic IMPRESSION: 1. Apparent previous removal of the trapezium bone with a few tiny calcifications in this area. 2. 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 No erosions. 3.  No acute fracture or dislocation. 4.  Bones osteoporotic. Electronically Signed   By: Lowella Grip III M.D.   On: 08/19/2020 13:53   MR WRIST RIGHT WO CONTRAST  Result Date: 08/20/2020 CLINICAL DATA:  Right hand and wrist pain.  Recent falls. EXAM: MRI OF THE RIGHT HAND WITHOUT CONTRAST; MRI OF THE RIGHT WRIST WITHOUT CONTRAST TECHNIQUE: Multiplanar, multisequence MR imaging of the right hand and wrist was performed. No intravenous contrast was administered. COMPARISON:  Right hand x-rays dated August 01, 2020.  FINDINGS: Ligaments: Chronic complete tear of the scapholunate ligament with widening of the scapholunate interval. The lunotriquetral ligament is intact. Triangular fibrocartilage: There is a small partial tear of the articular disc ulnar surface. The foveal component of the triangular ligament is completely torn. Slight ulnar minus variance. Tendons: Flexor and extensor tendons are intact. Large amount of fluid in the flexor tendon sheaths. Mild extensor carpi ulnaris tendinosis with small amount of fluid in the tendon sheath. Carpal tunnel/median nerve: Unremarkable. Guyon's canal: Unremarkable. Joint/cartilage: Scattered cartilage loss about the wrist with large areas of full-thickness cartilage loss in the radioscaphoid and scaphotrapeziotrapezoid joints. Scattered small carpal bone erosions. Additional small erosions of the second and third MCP joints with synovial thickening. Large radiocarpal and midcarpal joint effusions.Small distal radioulnar joint effusion. 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 Moderate first and third MCP joint osteoarthritis. Moderate to severe DIP joint osteoarthritis. Bones/carpal alignment: Chronic widening of the scapholunate interval. Early proximal migration of the capitate.No suspicious marrow signal abnormality. No acute fracture or dislocation. Other: Severe soft tissue swelling of the wrist and hand. No fluid collection. IMPRESSION: 1. No acute abnormality.  No fracture or evidence of osteomyelitis. 2. 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 3. Tears of the TFCC articular disc and foveal component of the triangular ligament. 4. Early SLAC wrist. 5. 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 6. Severe soft tissue swelling of the wrist and hand. No fluid collection. Electronically Signed   By: Titus Dubin M.D.   On: 08/20/2020 09:24   DG Chest Port 1 View  Result Date: 08/17/2020 CLINICAL DATA:  Dyspnea. EXAM: PORTABLE CHEST 1 VIEW COMPARISON:  August 15, 2020. FINDINGS: Stable cardiomediastinal silhouette. No pneumothorax or pleural effusion is noted. No consolidative process is noted. Bony thorax is unremarkable. IMPRESSION: No active disease. Electronically Signed   By: Marijo Conception M.D.   On: 08/17/2020 08:46   ECHOCARDIOGRAM COMPLETE  Result Date: 08/19/2020    ECHOCARDIOGRAM REPORT  Patient Name:   SHAKEERAH PIENKOWSKI [facility] Date of Exam: 08/19/2020 Medical Rec #:  [id number]              Height:       61.0 in Accession #:    [id number]             Weight:       126.9 lb Date of Birth:  [birth year]              BSA:          1.557 m Patient Age:    28 years               BP:           135/85 mmHg Patient Gender: F                      HR:           74 bpm. Exam Location:  Inpatient Procedure: 2D Echo, Cardiac Doppler and Color Doppler Indications:     Candidemia  History:         Patient has no prior history of Echocardiogram examinations.                  Risk Factors:Hypertension and Diabetes.  Sonographer:     [place], [place], RVT, RDCS Referring Phys:  [id number] [facility] Diagnosing Phys: Oswaldo Milian MD  Sonographer Comments: Restricted mobility; unable to turn patient due to shoulder injury. IMPRESSIONS  1. Left ventricular ejection fraction, by estimation, is 70 to 75%. The left ventricle has hyperdynamic function. The left ventricle has no regional wall motion abnormalities. Left ventricular diastolic parameters are consistent with Grade II diastolic dysfunction (pseudonormalization). Elevated left atrial pressure. Hyperdynamic LV function with mid cavitary/LVOT gradient up to 23 mmHg  2. Right ventricular systolic function is normal. The right  ventricular size is normal. There is mildly elevated pulmonary artery systolic pressure. The estimated right ventricular systolic pressure is 0000000 mmHg.  3. Right atrial size was mildly dilated.  4. The mitral valve is normal in structure. No evidence of mitral valve regurgitation.  5. The aortic valve was not well visualized. Aortic valve regurgitation is trivial. No aortic stenosis is present.  6. The inferior vena cava is normal in size with greater than 50% respiratory variability, suggesting right atrial pressure of 3 mmHg. Conclusion(s)/Recommendation(s): No vegetation seen, but technically difficult study. If high clinical suspicion for endocarditis, recommend TEE. FINDINGS  Left Ventricle: Left ventricular ejection fraction, by estimation, is 70 to 75%. The left ventricle has hyperdynamic function. The left ventricle has no regional wall motion abnormalities. The left ventricular internal cavity size was small. There is no  left ventricular hypertrophy. Left ventricular diastolic parameters are consistent with Grade II diastolic dysfunction (pseudonormalization). Elevated left atrial pressure. Right Ventricle: The right ventricular size is normal. No increase in right ventricular wall thickness. Right ventricular systolic function is normal. There is mildly elevated pulmonary artery systolic pressure. The tricuspid regurgitant velocity is 2.88  m/s, and with an assumed right atrial pressure of 3 mmHg, the estimated right ventricular systolic pressure is 0000000 mmHg. Left Atrium: Left atrial size was normal in size. Right Atrium: Right atrial size was mildly dilated. Pericardium: There is no evidence of pericardial effusion. Mitral Valve: The mitral valve is normal in structure. No evidence of mitral valve regurgitation. MV peak gradient, 17.8 mmHg. The mean mitral valve gradient is 5.0 mmHg. Tricuspid Valve: The tricuspid  valve is normal in structure. Tricuspid valve regurgitation is trivial. Aortic Valve: The  aortic valve was not well visualized. Aortic valve regurgitation is trivial. No aortic stenosis is present. Pulmonic Valve: The pulmonic valve was not well visualized. Pulmonic valve regurgitation is not visualized. Aorta: The aortic root is normal in size and structure. Venous: The inferior vena cava is normal in size with greater than 50% respiratory variability, suggesting right atrial pressure of 3 mmHg. IAS/Shunts: The interatrial septum was not well visualized.  LEFT VENTRICLE PLAX 2D LVIDd:         3.20 cm  Diastology LVIDs:         2.20 cm  LV e' medial:    3.70 cm/s LV PW:         0.90 cm  LV E/e' medial:  26.3 LV IVS:        0.70 cm  LV e' lateral:   4.03 cm/s LVOT diam:     1.30 cm  LV E/e' lateral: 24.2 LV SV:         44 LV SV Index:   28 LVOT Area:     1.33 cm  RIGHT VENTRICLE RV S prime:     13.30 cm/s TAPSE (M-mode): 1.6 cm LEFT ATRIUM             Index       RIGHT ATRIUM           Index LA diam:        3.50 cm 2.25 cm/m  RA Area:     16.70 cm LA Vol (A2C):   29.5 ml 18.95 ml/m RA Volume:   43.60 ml  28.01 ml/m LA Vol (A4C):   39.1 ml 25.12 ml/m LA Biplane Vol: 33.4 ml 21.46 ml/m  AORTIC VALVE LVOT Vmax:   194.00 cm/s LVOT Vmean:  146.000 cm/s LVOT VTI:    0.333 m  AORTA Ao Root diam: 2.70 cm MITRAL VALVE                TRICUSPID VALVE MV Area (PHT): 2.21 cm     TR Peak grad:   33.2 mmHg MV Area VTI:   0.71 cm     TR Vmax:        288.00 cm/s MV Peak grad:  17.8 mmHg MV Mean grad:  5.0 mmHg     SHUNTS MV Vmax:       2.11 m/s     Systemic VTI:  0.33 m MV Vmean:      105.0 cm/s   Systemic Diam: 1.30 cm MV Decel Time: 343 msec MV E velocity: 97.40 cm/s MV A velocity: 178.00 cm/s MV E/A ratio:  0.55 Oswaldo Milian MD Electronically signed by Oswaldo Milian MD Signature Date/Time: 08/19/2020/9:03:51 PM    Final (Updated)

## 2020-08-20 NOTE — Progress Notes (Addendum)
[facility] Progress Note  CC:  GI bleed. Anemia.   Subjective:  She complains of fatigue. She had mild nausea today, vomited up a small amount of nonbloody secretions. No abdominal pain. She passes a small amount of stool today, no blood or black stools. Her husband could not visit he today as he has Covid symptoms. No current SOB. No CP.   Objective:  Vital signs in last 24 hours: Temp:  [97.8 F (36.6 C)-98 F (36.7 C)] 98 F (36.7 C) (04/09 0421) Pulse Rate:  [72] 72 (04/09 0421) Resp:  [16-18] 18 (04/09 0421) BP: (142-189)/(73-91) 142/82 (04/09 0558) SpO2:  [98 %-99 %] 98 % (04/09 0421) Weight:  [57 kg] 57 kg (04/09 0339)   General:   Alert fatigued appearing female in NAD.  Heart: RRR, no murmur.  Pulm: Few exp wheezes left lobe, cleared after coughing. Diminished in the bases otherwise clear. She is on oxygen 2L [place].  Abdomen: Mild distension, soft, nontender. + BS x 4 quads.  Extremities:  RUE with edema.  Neurologic:  Alert and  oriented x4. Speech is clear. She answers questions appropriately. Moves all extremities.  Psych:  Alert and cooperative. Normal mood and affect.  Intake/Output from previous day: 04/08 0701 - 04/09 0700 In: 1678.2 [P.O.:720; I.V.:828.2; IV Piggyback:130] Out: -  Intake/Output this shift: Total I/O In: 240 [P.O.:240] Out: -   Lab Results: Recent Labs    08/18/20 0232 08/19/20 0158 08/20/20 0030  WBC 4.9 3.0* 3.3*  HGB 8.1* 8.4* 8.5*  HCT 24.2* 26.2* 26.2*  PLT 132* 141* 128*   BMET Recent Labs    08/18/20 0232 08/19/20 0158 08/20/20 0030  NA 132* 135 133*  K 4.2 3.4* 3.7  CL 101 101 104  CO2 '23 23 22  '$ GLUCOSE 229* 122* 189*  BUN 59* 56* 53*  CREATININE 3.48* 3.44* 3.17*  CALCIUM 7.7* 8.0* 7.6*   LFT Recent Labs    08/19/20 0158  PROT 5.0*  ALBUMIN 1.9*  AST 16  ALT 16  ALKPHOS 62  BILITOT 0.4   PT/INR No results for input(s): LABPROT, INR in the last 72 hours. Hepatitis Panel No results  for input(s): HEPBSAG, HCVAB, HEPAIGM, HEPBIGM in the last 72 hours.  MR HAND RIGHT WO CONTRAST  Result Date: 08/20/2020 CLINICAL DATA:  Right hand and wrist pain.  Recent falls. EXAM: MRI OF THE RIGHT HAND WITHOUT CONTRAST; MRI OF THE RIGHT WRIST WITHOUT CONTRAST TECHNIQUE: Multiplanar, multisequence MR imaging of the right hand and wrist was performed. No intravenous contrast was administered. COMPARISON:  Right hand x-rays dated August 01, 2020. FINDINGS: Ligaments: Chronic complete tear of the scapholunate ligament with widening of the scapholunate interval. The lunotriquetral ligament is intact. Triangular fibrocartilage: There is a small partial tear of the articular disc ulnar surface. The foveal component of the triangular ligament is completely torn. Slight ulnar minus variance. Tendons: Flexor and extensor tendons are intact. Large amount of fluid in the flexor tendon sheaths. Mild extensor carpi ulnaris tendinosis with small amount of fluid in the tendon sheath. Carpal tunnel/median nerve: Unremarkable. Guyon's canal: Unremarkable. Joint/cartilage: Scattered cartilage loss about the wrist with large areas of full-thickness cartilage loss in the radioscaphoid and scaphotrapeziotrapezoid joints. Scattered small carpal bone erosions. Additional small erosions of the second and third MCP joints with synovial thickening. Large radiocarpal and midcarpal joint effusions.Small distal radioulnar joint effusion. 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 Moderate first and third MCP joint osteoarthritis. Moderate to severe DIP  joint osteoarthritis. Bones/carpal alignment: Chronic widening of the scapholunate interval. Early proximal migration of the capitate.No suspicious marrow signal abnormality. No acute fracture or dislocation. Other: Severe soft tissue swelling of the wrist and hand. No fluid collection. IMPRESSION: 1. No acute abnormality.  No fracture or evidence of osteomyelitis. 2. 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 3. Tears of the TFCC articular disc and foveal component of the triangular ligament. 4. Early SLAC wrist. 5. 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 6. Severe soft tissue swelling of the wrist and hand. No fluid collection. Electronically Signed   By: Titus Dubin M.D.   On: 08/20/2020 09:24   DG Hand 2 View Left  Result Date: 08/19/2020 CLINICAL DATA:  Pain EXAM: LEFT HAND - 2 VIEW COMPARISON:  None. FINDINGS: Frontal and lateral views obtained. There has been previous apparent removal of the trapezium bone. Postoperative changes noted in the proximal second metacarpal and trapezoid bones. Minimal calcification is noted in the expected location of the trapezium bone. No acute fracture or dislocation. Bones are osteoporotic. There is marked narrowing of all D IP joints with spurring in all of these joints. There is moderate narrowing in the MCP and PIP joints. No erosion or periostitis evident. Bones are somewhat osteopenic IMPRESSION: 1. Apparent previous removal of the trapezium bone with a few tiny calcifications in this area. 2. 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 No erosions. 3.  No acute fracture or dislocation. 4.  Bones osteoporotic. Electronically Signed   By: Lowella Grip III M.D.   On: 08/19/2020 13:53   MR WRIST RIGHT WO CONTRAST  Result Date: 08/20/2020 CLINICAL DATA:  Right hand and wrist pain.  Recent falls. EXAM: MRI OF THE RIGHT HAND WITHOUT CONTRAST; MRI OF THE RIGHT WRIST WITHOUT CONTRAST TECHNIQUE: Multiplanar, multisequence MR imaging of the right hand and wrist was performed. No intravenous contrast was administered. COMPARISON:  Right hand x-rays  dated August 01, 2020. FINDINGS: Ligaments: Chronic complete tear of the scapholunate ligament with widening of the scapholunate interval. The lunotriquetral ligament is intact. Triangular fibrocartilage: There is a small partial tear of the articular disc ulnar surface. The foveal component of the triangular ligament is completely torn. Slight ulnar minus variance. Tendons: Flexor and extensor tendons are intact. Large amount of fluid in the flexor tendon sheaths. Mild extensor carpi ulnaris tendinosis with small amount of fluid in the tendon sheath. Carpal tunnel/median nerve: Unremarkable. Guyon's canal: Unremarkable. Joint/cartilage: Scattered cartilage loss about the wrist with large areas of full-thickness cartilage loss in the radioscaphoid and scaphotrapeziotrapezoid joints. Scattered small carpal bone erosions. Additional small erosions of the second and third MCP joints with synovial thickening. Large radiocarpal and midcarpal joint effusions.Small distal radioulnar joint effusion. 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 Moderate first and third MCP joint osteoarthritis. Moderate to severe DIP joint osteoarthritis. Bones/carpal alignment: Chronic widening of the scapholunate interval. Early proximal migration of the capitate.No suspicious marrow signal abnormality. No acute fracture or dislocation. Other: Severe soft tissue swelling of the wrist and hand. No fluid collection. IMPRESSION: 1. No acute abnormality.  No fracture or evidence of osteomyelitis. 2. Constellation of findings including severe flexor tenosynovitis, scattered carpal bone and second and third MCP joint erosions, and large radiocarpal midcarpal joint effusions, are  most suggestive of inflammatory arthropathy, potentially a mixed pattern of rheumatoid arthritis and CPPD arthropathy. 3. Tears of the TFCC articular disc and foveal component of the triangular ligament. 4. Early SLAC wrist. 5. Advanced osteoarthritis of the first  CMC joint and DIP joints. 6. Severe soft tissue swelling of the wrist and hand. No fluid collection. Electronically Signed   By: Titus Dubin M.D.   On: 08/20/2020 09:24   ECHOCARDIOGRAM COMPLETE  Result Date: 08/19/2020    ECHOCARDIOGRAM REPORT   Patient Name:   MAIRYN POLHAMUS [facility] Date of Exam: 08/19/2020 Medical Rec #:  [id number]              Height:       61.0 in Accession #:    [id number]             Weight:       126.9 lb Date of Birth:  August 11, 1945              BSA:          1.557 m Patient Age:    75 years               BP:           135/85 mmHg Patient Gender: F                      HR:           74 bpm. Exam Location:  Inpatient Procedure: 2D Echo, Cardiac Doppler and Color Doppler Indications:     Candidemia  History:         Patient has no prior history of Echocardiogram examinations.                  Risk Factors:Hypertension and Diabetes.  Sonographer:     [place], [place], RVT, RDCS Referring Phys:  [id number] [facility] Diagnosing Phys: Oswaldo Milian MD  Sonographer Comments: Restricted mobility; unable to turn patient due to shoulder injury. IMPRESSIONS  1. Left ventricular ejection fraction, by estimation, is 70 to 75%. The left ventricle has hyperdynamic function. The left ventricle has no regional wall motion abnormalities. Left ventricular diastolic parameters are consistent with Grade II diastolic dysfunction (pseudonormalization). Elevated left atrial pressure. Hyperdynamic LV function with mid cavitary/LVOT gradient up to 23 mmHg  2. Right ventricular systolic function is normal. The right ventricular size is normal. There is mildly elevated pulmonary artery systolic pressure. The estimated right ventricular systolic pressure is 0000000 mmHg.  3. Right atrial size was mildly dilated.  4. The mitral valve is normal in structure. No evidence of mitral valve regurgitation.  5. The aortic valve was not well visualized. Aortic valve regurgitation is trivial. No aortic  stenosis is present.  6. The inferior vena cava is normal in size with greater than 50% respiratory variability, suggesting right atrial pressure of 3 mmHg. Conclusion(s)/Recommendation(s): No vegetation seen, but technically difficult study. If high clinical suspicion for endocarditis, recommend TEE. FINDINGS  Left Ventricle: Left ventricular ejection fraction, by estimation, is 70 to 75%. The left ventricle has hyperdynamic function. The left ventricle has no regional wall motion abnormalities. The left ventricular internal cavity size was small. There is no  left ventricular hypertrophy. Left ventricular diastolic parameters are consistent with Grade II diastolic dysfunction (pseudonormalization). Elevated left atrial pressure. Right Ventricle: The right ventricular size is normal. No increase in right ventricular wall thickness. Right ventricular systolic function is normal. There is mildly elevated pulmonary  artery systolic pressure. The tricuspid regurgitant velocity is 2.88  m/s, and with an assumed right atrial pressure of 3 mmHg, the estimated right ventricular systolic pressure is 0000000 mmHg. Left Atrium: Left atrial size was normal in size. Right Atrium: Right atrial size was mildly dilated. Pericardium: There is no evidence of pericardial effusion. Mitral Valve: The mitral valve is normal in structure. No evidence of mitral valve regurgitation. MV peak gradient, 17.8 mmHg. The mean mitral valve gradient is 5.0 mmHg. Tricuspid Valve: The tricuspid valve is normal in structure. Tricuspid valve regurgitation is trivial. Aortic Valve: The aortic valve was not well visualized. Aortic valve regurgitation is trivial. No aortic stenosis is present. Pulmonic Valve: The pulmonic valve was not well visualized. Pulmonic valve regurgitation is not visualized. Aorta: The aortic root is normal in size and structure. Venous: The inferior vena cava is normal in size with greater than 50% respiratory variability,  suggesting right atrial pressure of 3 mmHg. IAS/Shunts: The interatrial septum was not well visualized.  LEFT VENTRICLE PLAX 2D LVIDd:         3.20 cm  Diastology LVIDs:         2.20 cm  LV e' medial:    3.70 cm/s LV PW:         0.90 cm  LV E/e' medial:  26.3 LV IVS:        0.70 cm  LV e' lateral:   4.03 cm/s LVOT diam:     1.30 cm  LV E/e' lateral: 24.2 LV SV:         44 LV SV Index:   28 LVOT Area:     1.33 cm  RIGHT VENTRICLE RV S prime:     13.30 cm/s TAPSE (M-mode): 1.6 cm LEFT ATRIUM             Index       RIGHT ATRIUM           Index LA diam:        3.50 cm 2.25 cm/m  RA Area:     16.70 cm LA Vol (A2C):   29.5 ml 18.95 ml/m RA Volume:   43.60 ml  28.01 ml/m LA Vol (A4C):   39.1 ml 25.12 ml/m LA Biplane Vol: 33.4 ml 21.46 ml/m  AORTIC VALVE LVOT Vmax:   194.00 cm/s LVOT Vmean:  146.000 cm/s LVOT VTI:    0.333 m  AORTA Ao Root diam: 2.70 cm MITRAL VALVE                TRICUSPID VALVE MV Area (PHT): 2.21 cm     TR Peak grad:   33.2 mmHg MV Area VTI:   0.71 cm     TR Vmax:        288.00 cm/s MV Peak grad:  17.8 mmHg MV Mean grad:  5.0 mmHg     SHUNTS MV Vmax:       2.11 m/s     Systemic VTI:  0.33 m MV Vmean:      105.0 cm/s   Systemic Diam: 1.30 cm MV Decel Time: 343 msec MV E velocity: 97.40 cm/s MV A velocity: 178.00 cm/s MV E/A ratio:  0.55 Oswaldo Milian MD Electronically signed by Oswaldo Milian MD Signature Date/Time: 08/19/2020/9:03:51 PM    Final (Updated)     Assessment / Plan:  1. Acute on chronic anemia. + FOBT. Hg 8.5 -> 7.1. Received 1 unit of PRBCs on 4/6 -> Hg stable 8.5.  -I discussed scheduling a  possible EGD/colonoscopy on Monday 08/22/2020, discussed the benefits and risks. She is not sure she is prepared to make any medical decisions at this time. She would like to discuss this further with her husband. I will call her husband on conference call tomorrow to re-review EGD/colonoscopy procedure risk and benefits. Her husband has Covid symptoms therefore he cannot be at  her bedside in person.  -Continue to monitor for active GI bleeding  -Diet as tolerated  -Monitor H/H daily -Further recommendations per Dr. Loletha Carrow   2. History of GERD. EGD in 2018 dentified. Candida esophagitis   3. 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 Immunosuppressive drugs include Tacrolimus, Prednisone. Bactrim prophylaxis.   4.   Bilateral pneumonia, CAP.  Patient hospitalized briefly in early February 2022 with breakthrough Covid pneumonia. On Zithromax and Cefdinir.   5. Carpal tunnel syndrome.R wrist and L ring finger fracture.Being treated with Prednisone.   6. 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 Followed by ID, thought related to GI bleed.   7. Acute metabolic encephalopathy, resolving.  8. DM II     Principal Problem:   Acute metabolic encephalopathy Active Problems:   Anemia in chronic kidney disease   Diabetes mellitus type 2 in nonobese [facility])   Essential hypertension   H/O kidney transplant   Stage 4 chronic kidney disease (HCC)   Bilateral wrist pain   Multifocal pneumonia   History of COVID-19   Chronic back pain   Blood in the stool     LOS: 3 days   Noralyn Pick  08/20/2020, 3:56 PM  I have discussed the case with the PA, and that is the plan I formulated. I personally interviewed and examined the patient.  I saw this patient in initial consultation several days ago, she was last seen by our service 3 days ago, and we were asked by the [facility] physician to reevaluate her today.  Her respiratory status is improved, but she is still chronically ill.  She has been discovered to have fungal EMEA to account for her sepsis picture, cause of which is unclear.  Apparently infectious disease suggested this may be related to a GI source, but it seems unlikely that a source of chronic occult GI blood loss would lead to fungemia.  Of course, she is on multiple immune suppressants due  to prior renal transplant.  This patient has acute on chronic anemia that I think is largely from CKD, and she was reportedly heme positive at [place] as we know, it has been years since she has had any endoscopic procedures performed.  Upper endoscopy and colonoscopy are reasonable to see if there is an identifiable source of occult GI blood loss.  She is definitely at increased sedation risk of the procedures due to her multiple complex medical conditions. No overt GI bleeding witness during this hospitalization, hemoglobin stable. We have offered to make time to do this while she is inpatient, and she would like to consider it further before making final decision.  Our service will get back with her tomorrow and see how she would like to proceed.  I spent a total of 25 minutes with the patient reviewing hospital notes, imaging reports, pathology (if applicable),  labs and examining the patient as well as establishing an assessment and plan that was discussed with the patient.  > 50% of time was spent in direct patient care.    Case discussed with Dr.  Elgergawy  Nelida Meuse III Office: [phone]

## 2020-08-21 DIAGNOSIS — R195 Other fecal abnormalities: Secondary | ICD-10-CM | POA: Diagnosis not present

## 2020-08-21 DIAGNOSIS — E119 Type 2 diabetes mellitus without complications: Secondary | ICD-10-CM | POA: Diagnosis not present

## 2020-08-21 DIAGNOSIS — G9341 Metabolic encephalopathy: Secondary | ICD-10-CM | POA: Diagnosis not present

## 2020-08-21 DIAGNOSIS — D631 Anemia in chronic kidney disease: Secondary | ICD-10-CM | POA: Diagnosis not present

## 2020-08-21 DIAGNOSIS — N185 Chronic kidney disease, stage 5: Secondary | ICD-10-CM | POA: Diagnosis not present

## 2020-08-21 DIAGNOSIS — M25531 Pain in right wrist: Secondary | ICD-10-CM | POA: Diagnosis not present

## 2020-08-21 LAB — CULTURE, BLOOD (ROUTINE X 2)

## 2020-08-21 LAB — GLUCOSE, CAPILLARY
Glucose-Capillary: 102 mg/dL — ABNORMAL HIGH (ref 70–99)
Glucose-Capillary: 151 mg/dL — ABNORMAL HIGH (ref 70–99)
Glucose-Capillary: 177 mg/dL — ABNORMAL HIGH (ref 70–99)
Glucose-Capillary: 214 mg/dL — ABNORMAL HIGH (ref 70–99)

## 2020-08-21 LAB — CBC
HCT: 28.1 % — ABNORMAL LOW (ref 36.0–46.0)
Hemoglobin: 9 g/dL — ABNORMAL LOW (ref 12.0–15.0)
MCH: 28.3 pg (ref 26.0–34.0)
MCHC: 32 g/dL (ref 30.0–36.0)
MCV: 88.4 fL (ref 80.0–100.0)
Platelets: 140 10*3/uL — ABNORMAL LOW (ref 150–400)
RBC: 3.18 MIL/uL — ABNORMAL LOW (ref 3.87–5.11)
RDW: 17.9 % — ABNORMAL HIGH (ref 11.5–15.5)
WBC: 4.1 10*3/uL (ref 4.0–10.5)
nRBC: 0 % (ref 0.0–0.2)

## 2020-08-21 LAB — BASIC METABOLIC PANEL
Anion gap: 6 (ref 5–15)
BUN: 50 mg/dL — ABNORMAL HIGH (ref 8–23)
CO2: 24 mmol/L (ref 22–32)
Calcium: 7.9 mg/dL — ABNORMAL LOW (ref 8.9–10.3)
Chloride: 104 mmol/L (ref 98–111)
Creatinine, Ser: 3.08 mg/dL — ABNORMAL HIGH (ref 0.44–1.00)
GFR, Estimated: 15 mL/min — ABNORMAL LOW (ref >60)
Glucose, Bld: 155 mg/dL — ABNORMAL HIGH (ref 70–99)
Potassium: 3.7 mmol/L (ref 3.5–5.1)
Sodium: 134 mmol/L — ABNORMAL LOW (ref 135–145)

## 2020-08-21 MED ORDER — METHYLPREDNISOLONE SODIUM SUCC 125 MG IJ SOLR
80.0000 mg | Freq: Once | INTRAMUSCULAR | Status: AC
  Administered 2020-08-21: 80 mg via INTRAVENOUS
  Filled 2020-08-21: qty 2

## 2020-08-21 MED ORDER — POLYETHYLENE GLYCOL 3350 17 G PO PACK: 17.0000 g | PACK | Freq: Every day | ORAL | Status: DC | PRN

## 2020-08-21 MED ORDER — BISACODYL 10 MG RE SUPP
10.0000 mg | Freq: Once | RECTAL | Status: AC
  Administered 2020-08-21: 10 mg via RECTAL
  Filled 2020-08-21: qty 1

## 2020-08-21 NOTE — Progress Notes (Addendum)
[facility] Progress Note  CC:  GI bleed. Anemia  Subjective:  She is sitting up in the chair. No N/V. No abdominal pain. No cough or SOB. No CP. She passes a small amount of stool yesterday but no "significant BM" for 3 days.   Objective:  Vital signs in last 24 hours: Temp:  [97.5 F (36.4 C)-97.6 F (36.4 C)] 97.6 F (36.4 C) (04/10 0441) Pulse Rate:  [67-73] 73 (04/10 0441) Resp:  [17-20] 18 (04/10 0441) BP: (145-182)/(81-95) 159/94 (04/10 0441) SpO2:  [98 %-100 %] 98 % (04/10 0441) Weight:  [58 kg] 58 kg (04/10 0347)   General:  Alert in NAD.  Heart: RRR, no murmur.  Pulm:  Breath sounds clear, no wheezes on exam today. Off oxygen [place].  Abdomen: Distended, soft, nontender. + BS x 4 quads. RLQ and midline lower abdominal scars intact.  Extremities:  RUE with edema. No LE edema.  Neurologic:  Alert and  oriented x 4. Speech is clear. Moves all extremities.  Psych:  Alert and cooperative. Normal mood and affect.  Intake/Output from previous day: 04/09 0701 - 04/10 0700 In: 560 [P.O.:560] Out: 200 [Urine:200] Intake/Output this shift: No intake/output data recorded.  Lab Results: Recent Labs    08/19/20 0158 08/20/20 0030 08/21/20 0018  WBC 3.0* 3.3* 4.1  HGB 8.4* 8.5* 9.0*  HCT 26.2* 26.2* 28.1*  PLT 141* 128* 140*   BMET Recent Labs    08/19/20 0158 08/20/20 0030 08/21/20 0018  NA 135 133* 134*  K 3.4* 3.7 3.7  CL 101 104 104  CO2 '23 22 24  '$ GLUCOSE 122* 189* 155*  BUN 56* 53* 50*  CREATININE 3.44* 3.17* 3.08*  CALCIUM 8.0* 7.6* 7.9*   LFT Recent Labs    08/19/20 0158  PROT 5.0*  ALBUMIN 1.9*  AST 16  ALT 16  ALKPHOS 62  BILITOT 0.4   PT/INR No results for input(s): LABPROT, INR in the last 72 hours. Hepatitis Panel No results for input(s): HEPBSAG, HCVAB, HEPAIGM, HEPBIGM in the last 72 hours.  MR HAND RIGHT WO CONTRAST  Result Date: 08/20/2020 CLINICAL DATA:  Right hand and wrist pain.  Recent falls. EXAM: MRI OF THE  RIGHT HAND WITHOUT CONTRAST; MRI OF THE RIGHT WRIST WITHOUT CONTRAST TECHNIQUE: Multiplanar, multisequence MR imaging of the right hand and wrist was performed. No intravenous contrast was administered. COMPARISON:  Right hand x-rays dated August 01, 2020. FINDINGS: Ligaments: Chronic complete tear of the scapholunate ligament with widening of the scapholunate interval. The lunotriquetral ligament is intact. Triangular fibrocartilage: There is a small partial tear of the articular disc ulnar surface. The foveal component of the triangular ligament is completely torn. Slight ulnar minus variance. Tendons: Flexor and extensor tendons are intact. Large amount of fluid in the flexor tendon sheaths. Mild extensor carpi ulnaris tendinosis with small amount of fluid in the tendon sheath. Carpal tunnel/median nerve: Unremarkable. Guyon's canal: Unremarkable. Joint/cartilage: Scattered cartilage loss about the wrist with large areas of full-thickness cartilage loss in the radioscaphoid and scaphotrapeziotrapezoid joints. Scattered small carpal bone erosions. Additional small erosions of the second and third MCP joints with synovial thickening. Large radiocarpal and midcarpal joint effusions.Small distal radioulnar joint effusion. 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 Moderate first and third MCP joint osteoarthritis. Moderate to severe DIP joint osteoarthritis. Bones/carpal alignment: Chronic widening of the scapholunate interval. Early proximal migration of the capitate.No suspicious marrow signal abnormality. No acute fracture or dislocation. Other: Severe soft tissue swelling of  the wrist and hand. No fluid collection. IMPRESSION: 1. No acute abnormality.  No fracture or evidence of osteomyelitis. 2. 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 3. Tears of the TFCC articular disc and foveal component of the triangular ligament. 4. Early SLAC wrist. 5. 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 6. Severe soft tissue swelling of the wrist and hand. No fluid collection. Electronically Signed   By: Titus Dubin M.D.   On: 08/20/2020 09:24   DG Hand 2 View Left  Result Date: 08/19/2020 CLINICAL DATA:  Pain EXAM: LEFT HAND - 2 VIEW COMPARISON:  None. FINDINGS: Frontal and lateral views obtained. There has been previous apparent removal of the trapezium bone. Postoperative changes noted in the proximal second metacarpal and trapezoid bones. Minimal calcification is noted in the expected location of the trapezium bone. No acute fracture or dislocation. Bones are osteoporotic. There is marked narrowing of all D IP joints with spurring in all of these joints. There is moderate narrowing in the MCP and PIP joints. No erosion or periostitis evident. Bones are somewhat osteopenic IMPRESSION: 1. Apparent previous removal of the trapezium bone with a few tiny calcifications in this area. 2. 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 No erosions. 3.  No acute fracture or dislocation. 4.  Bones osteoporotic. Electronically Signed   By: Lowella Grip III M.D.   On: 08/19/2020 13:53   MR WRIST RIGHT WO CONTRAST  Result Date: 08/20/2020 CLINICAL DATA:  Right hand and wrist pain.  Recent falls. EXAM: MRI OF THE RIGHT HAND WITHOUT CONTRAST; MRI OF THE RIGHT WRIST WITHOUT CONTRAST TECHNIQUE: Multiplanar, multisequence MR imaging of the right hand and wrist was performed. No intravenous contrast was administered. COMPARISON:  Right hand x-rays dated August 01, 2020. FINDINGS: Ligaments: Chronic complete tear of the scapholunate ligament with widening of the scapholunate interval. The lunotriquetral ligament is intact. Triangular  fibrocartilage: There is a small partial tear of the articular disc ulnar surface. The foveal component of the triangular ligament is completely torn. Slight ulnar minus variance. Tendons: Flexor and extensor tendons are intact. Large amount of fluid in the flexor tendon sheaths. Mild extensor carpi ulnaris tendinosis with small amount of fluid in the tendon sheath. Carpal tunnel/median nerve: Unremarkable. Guyon's canal: Unremarkable. Joint/cartilage: Scattered cartilage loss about the wrist with large areas of full-thickness cartilage loss in the radioscaphoid and scaphotrapeziotrapezoid joints. Scattered small carpal bone erosions. Additional small erosions of the second and third MCP joints with synovial thickening. Large radiocarpal and midcarpal joint effusions.Small distal radioulnar joint effusion. 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 Moderate first and third MCP joint osteoarthritis. Moderate to severe DIP joint osteoarthritis. Bones/carpal alignment: Chronic widening of the scapholunate interval. Early proximal migration of the capitate.No suspicious marrow signal abnormality. No acute fracture or dislocation. Other: Severe soft tissue swelling of the wrist and hand. No fluid collection. IMPRESSION: 1. No acute abnormality.  No fracture or evidence of osteomyelitis. 2. 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 3. Tears of the TFCC articular disc and foveal component of the triangular ligament. 4. Early  SLAC wrist. 5. 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 6. Severe soft tissue swelling of the wrist and hand. No fluid collection. Electronically Signed   By: Titus Dubin M.D.   On: 08/20/2020 09:24    ECHOCARDIOGRAM COMPLETE  Result Date: 08/19/2020    ECHOCARDIOGRAM REPORT   Patient Name:   TIAWNA GILLETT [facility] Date of Exam: 08/19/2020 Medical Rec #:  [id number]              Height:       61.0 in Accession #:    [id number]             Weight:       126.9 lb Date of Birth:  Nov 01, 1945              BSA:          1.557 m Patient Age:    50 years               BP:           135/85 mmHg Patient Gender: F                      HR:           74 bpm. Exam Location:  Inpatient Procedure: 2D Echo, Cardiac Doppler and Color Doppler Indications:     Candidemia  History:         Patient has no prior history of Echocardiogram examinations.                  Risk Factors:Hypertension and Diabetes.  Sonographer:     Wainaku, Hinckley, RVT, RDCS Referring Phys:  [id number] [facility] Diagnosing Phys: Oswaldo Milian MD  Sonographer Comments: Restricted mobility; unable to turn patient due to shoulder injury. IMPRESSIONS  1. Left ventricular ejection fraction, by estimation, is 70 to 75%. The left ventricle has hyperdynamic function. The left ventricle has no regional wall motion abnormalities. Left ventricular diastolic parameters are consistent with Grade II diastolic dysfunction (pseudonormalization). Elevated left atrial pressure. Hyperdynamic LV function with mid cavitary/LVOT gradient up to 23 mmHg  2. Right ventricular systolic function is normal. The right ventricular size is normal. There is mildly elevated pulmonary artery systolic pressure. The estimated right ventricular systolic pressure is 0000000 mmHg.  3. Right atrial size was mildly dilated.  4. The mitral valve is normal in structure. No evidence of mitral valve regurgitation.  5. The aortic valve was not well visualized. Aortic valve regurgitation is trivial. No aortic stenosis is present.  6. The inferior vena cava is normal in size with greater than 50% respiratory variability, suggesting right atrial pressure of 3 mmHg.  Conclusion(s)/Recommendation(s): No vegetation seen, but technically difficult study. If high clinical suspicion for endocarditis, recommend TEE. FINDINGS  Left Ventricle: Left ventricular ejection fraction, by estimation, is 70 to 75%. The left ventricle has hyperdynamic function. The left ventricle has no regional wall motion abnormalities. The left ventricular internal cavity size was small. There is no  left ventricular hypertrophy. Left ventricular diastolic parameters are consistent with Grade II diastolic dysfunction (pseudonormalization). Elevated left atrial pressure. Right Ventricle: The right ventricular size is normal. No increase in right ventricular wall thickness. Right ventricular systolic function is normal. There is mildly elevated pulmonary artery systolic pressure. The tricuspid regurgitant velocity is 2.88  m/s, and with an assumed right atrial pressure of 3 mmHg, the estimated right ventricular systolic pressure is 0000000 mmHg. Left  Atrium: Left atrial size was normal in size. Right Atrium: Right atrial size was mildly dilated. Pericardium: There is no evidence of pericardial effusion. Mitral Valve: The mitral valve is normal in structure. No evidence of mitral valve regurgitation. MV peak gradient, 17.8 mmHg. The mean mitral valve gradient is 5.0 mmHg. Tricuspid Valve: The tricuspid valve is normal in structure. Tricuspid valve regurgitation is trivial. Aortic Valve: The aortic valve was not well visualized. Aortic valve regurgitation is trivial. No aortic stenosis is present. Pulmonic Valve: The pulmonic valve was not well visualized. Pulmonic valve regurgitation is not visualized. Aorta: The aortic root is normal in size and structure. Venous: The inferior vena cava is normal in size with greater than 50% respiratory variability, suggesting right atrial pressure of 3 mmHg. IAS/Shunts: The interatrial septum was not well visualized.  LEFT VENTRICLE PLAX 2D LVIDd:         3.20 cm  Diastology  LVIDs:         2.20 cm  LV e' medial:    3.70 cm/s LV PW:         0.90 cm  LV E/e' medial:  26.3 LV IVS:        0.70 cm  LV e' lateral:   4.03 cm/s LVOT diam:     1.30 cm  LV E/e' lateral: 24.2 LV SV:         44 LV SV Index:   28 LVOT Area:     1.33 cm  RIGHT VENTRICLE RV S prime:     13.30 cm/s TAPSE (M-mode): 1.6 cm LEFT ATRIUM             Index       RIGHT ATRIUM           Index LA diam:        3.50 cm 2.25 cm/m  RA Area:     16.70 cm LA Vol (A2C):   29.5 ml 18.95 ml/m RA Volume:   43.60 ml  28.01 ml/m LA Vol (A4C):   39.1 ml 25.12 ml/m LA Biplane Vol: 33.4 ml 21.46 ml/m  AORTIC VALVE LVOT Vmax:   194.00 cm/s LVOT Vmean:  146.000 cm/s LVOT VTI:    0.333 m  AORTA Ao Root diam: 2.70 cm MITRAL VALVE                TRICUSPID VALVE MV Area (PHT): 2.21 cm     TR Peak grad:   33.2 mmHg MV Area VTI:   0.71 cm     TR Vmax:        288.00 cm/s MV Peak grad:  17.8 mmHg MV Mean grad:  5.0 mmHg     SHUNTS MV Vmax:       2.11 m/s     Systemic VTI:  0.33 m MV Vmean:      105.0 cm/s   Systemic Diam: 1.30 cm MV Decel Time: 343 msec MV E velocity: 97.40 cm/s MV A velocity: 178.00 cm/s MV E/A ratio:  0.55 Oswaldo Milian MD Electronically signed by Oswaldo Milian MD Signature Date/Time: 08/19/2020/9:03:51 PM    Final (Updated)     Assessment / Plan:  1. Acute on chronic anemia. + FOBT. Hg 8.5 -> 7.1. Received 1 unit of PRBCs on 4/6 -> Hg 8.5 -> today Hg 9.0. -Continue to monitor for active GI bleeding  -Diet as tolerated  -Monitor H/H daily -I spoke to the patient's husband Mr. Reola Calkins and discussed a potential EGD and colonoscopy tomorrow to  further evaluate patient's anemia with + FOBT. Mr. Lacey Jensen declined consent for EGD and colonoscopy at [facility]. He would like to purse transferring his wife to short term rehab associated with [facility] in [place] where reside and to consider an EGD/colonoscopy with a GI in [place]. Defer further arrangements to the hospitalist.   2. History of  GERD. EGD in 2018 dentified. Candida esophagitis  3. 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 Immunosuppressive drugs include Tacrolimus, Prednisone. Bactrim prophylaxis.   4. Bilateral pneumonia, CAP. Patient hospitalized briefly in early February 2022 with breakthrough Covid pneumonia. On Zithromax and Cefdinir.   5. Carpal tunnel syndrome.R wrist and L ring finger fracture.Being treated with Prednisone.   6. 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 Followed by ID, thought related to GI bleed.   7. Acute metabolic encephalopathy, resolving.  8. Thrombocytopenia. PLT 128 ->  140.   9. Constipation. Abdominal distension on exam.  -Miralax Q HS PRN -Docusate enema as ordered by the hospitalist if no BM today -Consider abdominal xray in am if no BM       Principal Problem:   Acute metabolic encephalopathy Active Problems:   Anemia in chronic kidney disease   Diabetes mellitus type 2 in nonobese [facility])   Essential hypertension   H/O kidney transplant   Stage 4 chronic kidney disease (HCC)   Bilateral wrist pain   Multifocal pneumonia   History of COVID-19   Chronic back pain   Blood in the stool     LOS: 4 days   Noralyn Pick  08/21/2020, 9:23 AM  I have discussed the case with the APP, and that is the plan I formulated. I personally interviewed and examined the patient.  Her respiratory status is stable.  She still seems somewhat uncertain of her medical condition and her decisions, so she largely defers to her husband.  Our NP and the [facility] physician had different and somewhat conflicting phone calls with the husband earlier today regarding his wishes and concerns.  He made it to the hospital despite having been in the emergency department yesterday for his own pulmonary issues.  I sat down had a long talk with him and his wife while he also got their daughter on speaker  phone.  They had many questions and concerns related to her condition, all of which were addressed.  Ultimately, they were trying to understand the reason for endoscopic procedures and why the GI physicians in [place] had sent her here and whether or not these things could or should be done here versus outpatient in [place].  I explained that I think the anemia is largely related to CKD, and that there is likely a lesser component of chronic occult GI blood loss, source of which currently unknown but we hope to discover with EGD and colonoscopy.  Her respiratory status precluded that earlier in the hospital stay, but is stable now and I believe she can undergo the procedures with acceptable risk.  However, she currently has a right arm in a suspended sling and I am not sure that between that and her debility she will really be able to do a bowel preparation very well.  I have told him that I feel that it would be best for her to have what ever endoscopic procedure she can undergo (EGD and colonoscopy if able to prep) while hospitalized given her medical condition and the complexity and logistics of the procedures in that context.  Mr. Munier and his daughter were ultimately agreeable and they appear to be making the decisions for day.  Procedure risks and benefits were reviewed in detail.  The benefits and risks of the planned procedure were described in detail with the patient or (when appropriate) their health care proxy.  Risks were outlined as including, but not limited to, bleeding, infection, perforation, adverse medication reaction leading to cardiac or pulmonary decompensation, pancreatitis (if ERCP).  The limitation of incomplete mucosal visualization was also discussed.  No guarantees or warranties were given.  Patient at increased risk for cardiopulmonary complications of procedure due to medical comorbidities.  Dr. Hilarie Fredrickson will assume the consult service tomorrow, and our team will  round on her tomorrow.  She will begin a clear liquid diet in the morning.  Then her capacity to undergo bowel preparation can be revisited.  They may elect to have her try to do so and then if she is unable, just do the upper endoscopy.  I spent a total of 35 minutes with the patient reviewing hospital notes, imaging reports, pathology (if applicable),  labs and examining the patient as well as establishing an assessment and plan that was discussed with the patient.  > 50% of time was spent in direct patient care.     Nelida Meuse III Office: [phone]

## 2020-08-21 NOTE — Progress Notes (Signed)
Occupational Therapy Treatment Patient Details Name: Ruth Riley MRN: [id number] DOB: [birth year] Today's Date: 08/21/2020    History of present illness 75 y.o. female presenting to [facility] with confusion, SoB, and cought, transferred to [facility] and admitted 08/16/20. Pt being treated for anemia, bilateral PNA and possible GI bleed.  PMH: DM; HTN; and h/o renal transplant with current stage 4 CKD (baseline 3.7) with anemia (baseline 9), COVID infection in early February. hx of multiple falls and orthopedic complaints most recently L ring finger fx.   OT comments  Pt progressing with use of BUEs; LUE feeding self today and HEP and elevation performed on R UE. Pt limited by decreased strength and ability to utilize BUEs s/t pain and immobility. Elevation of RUE and wrist splint applied. Pt education on OTR asking to order a hand elevation pillow, but unable to locate the order yet. MD Dr. Reinaldo Berber it. Pt performing RUE HEP for elbow, forearm and hands. Pt requires continued OT skilled services. OT following acutely. ** Spouse unable to properly care for spouse as pt +2 modA for transfers and unable to take more than a few steps.   Follow Up Recommendations  SNF    Equipment Recommendations  3 in 1 bedside commode    Recommendations for Other Services      Precautions / Restrictions Precautions Precautions: Fall Precaution Comments: multple falls, increased pain medication Required Braces or Orthoses: Splint/Cast Splint/Cast: on L ring finger, R wrist brace Restrictions Weight Bearing Restrictions: No       Mobility Bed Mobility               General bed mobility comments: in recliner pre and post session    Transfers                 General transfer comment: pt eating lunch in recliner    Balance Overall balance assessment: Needs assistance Sitting-balance support: Feet supported;No upper extremity supported Sitting balance-Leahy Scale: Fair Sitting balance -  Comments: repositioned in chair, scooting forward and backwards.                                   ADL either performed or assessed with clinical judgement   ADL Overall ADL's : Needs assistance/impaired Eating/Feeding: Set up;Sitting Eating/Feeding Details (indicate cue type and reason): using LUE Grooming: Set up;Sitting                               Functional mobility during ADLs: Moderate assistance General ADL Comments: Pt limited by decreased strength and ability to utilize BUEs s/t pain and immobility. Elevation of RUE and wrist splint applied. Pt education on OTR asking to order a hand elevation pillow, but unablet locate the order yet. MD Dr. Reinaldo Berber it. Pt performing RUE HEP for elbow, forearm and hands.     Vision   Vision Assessment?: No apparent visual deficits   Perception     Praxis      Cognition Arousal/Alertness: Awake/alert Behavior During Therapy: Flat affect Overall Cognitive Status: Impaired/Different from baseline Area of Impairment: Following commands;Problem solving;Memory                     Memory: Decreased short-term memory Following Commands: Follows one step commands with increased time;Follows multi-step commands inconsistently;Follows multi-step commands with increased time     Problem Solving: Requires  verbal cues General Comments: Pt A/O today, but repetitive thoughts and appeared "lost" without her husband as he normally speaks for her. Pt able to describe discomfort in recliner, but lack of ability to problem solve.        Exercises Other Exercises Other Exercises: IS x5, max inhalation 800 mL Other Exercises: elbow flex/ext and wrist/hand AROM x5-10 reps reach   Shoulder Instructions       General Comments Spouse in room to conclude session. Pt's wrist/hand MRI- no fractures, no osteomyelitis. 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; Early SLAC wrist    Pertinent Vitals/ Pain       Pain Assessment: Faces Faces Pain Scale: Hurts little more Pain Location: low back R hand/wrist Pain Descriptors / Indicators: Grimacing;Guarding Pain Intervention(s): Monitored during session  Home Living                                          Prior Functioning/Environment              Frequency  Min 2X/week        Progress Toward Goals  OT Goals(current goals can now be found in the care plan section)  Progress towards OT goals: Progressing toward goals  Acute Rehab OT Goals Patient Stated Goal: feel better OT Goal Formulation: With patient Time For Goal Achievement: 08/31/20 Potential to Achieve Goals: Good ADL Goals Pt Will Perform Eating: with modified independence;sitting Pt Will Perform Grooming: with min assist;sitting Pt Will Transfer to Toilet: with min assist;stand pivot transfer;bedside commode Pt/caregiver will Perform Home Exercise Program: Increased ROM;Both right and left upper extremity;With minimal assist;With written HEP provided Additional ADL Goal #1: Pt will perform bed mobility with minA in order to increase independence for OOB ADL. Additional ADL Goal #2: Pt will follow 1-2 step commands with minimal cues to attend to task.  Plan Discharge plan remains appropriate    Co-evaluation                 AM-PAC OT "6 Clicks" Daily Activity     Outcome Measure   Help from another person eating meals?: A Little Help from another person taking care of personal grooming?: A Little Help from another person toileting, which includes using toliet, bedpan, or urinal?: Total Help from another person bathing (including washing, rinsing, drying)?: A Lot Help from another person to put on and taking off regular upper body clothing?: A Lot Help from another person to put on and taking off regular lower body clothing?: A Lot 6 Click Score: 13    End of Session     OT Visit Diagnosis: Unsteadiness on feet (R26.81);Muscle weakness (generalized) (M62.81);Pain;Other symptoms and signs involving cognitive function Pain - Right/Left: Right Pain - part of body: Hand   Activity Tolerance Patient tolerated treatment well   Patient Left in chair;with call bell/phone within reach;with chair alarm set;with family/visitor present   Nurse Communication Mobility status        Time: [id number] OT Time Calculation (min): 31 min  Charges: OT General Charges $OT Visit: 1 Visit OT Treatments $Self Care/Home Management : 8-22 mins $Therapeutic Exercise: 8-22 mins  Jefferey Pica, OTR/L Acute Rehabilitation Services Pager: [phone] Office: [phone]    Ruth Riley C 08/21/2020, 3:30 PM

## 2020-08-21 NOTE — Progress Notes (Addendum)
PROGRESS NOTE                                                                             PROGRESS NOTE                                                                                                                                                                                                             Patient Demographics:    Ruth Riley, is a 75 y.o. female, DOB - 06-Jan-1946, [id number]  Outpatient Primary MD for the patient is [name], MD    LOS - 4  Admit date - 08/16/2020    No chief complaint on file.      Brief Narrative     Ruth Riley is a 75 y.o. female with medical history significant of DM; HTN; and h/o renal transplant with current stage 4 CKD (baseline 3.7) with anemia (baseline 9) presenting to [facility] with confusion.  When asked what brought her to the hospital, the patient repeatedly returns to her litany of orthopedic complaints.  Most recently, she did fall and injure her R wrist and then fell again and injured her L hand.  She acknowledges some SOB and cough that appear to have started in the last week; she was feeling better after her COVID infection in early February. -She was noted to have Hemoccult-positive stools with hemoglobin dropped from 9-7.5, given lack of GI coverage at [facility] she was transferred to [facility]. -She required 1 unit PRBC transfusion since admission, with no further recurrence of GI bleed, her main complaint was right wrist/hand pain, as well her blood cultures on admissions were significant for anemia for which she is being followed by ID.   Subjective:    Ruth Riley today still reporting right wrist/hand pain, no fever, no chills, no nausea or vomiting .   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  Multifocal PNA with h/o COVID-19 infection -Patient presenting with productive cough, mildly decreased oxygen saturation. -Chest x-ray at [facility] confirming subtle bibasilar opacities  -She was encouraged with incentive spirometry, flutter valve . -Treated with IV azithromycin and Rocephin . -She was encouraged to use incentive spirometry and flutter valve .  Fungemia -Cardiomyopathy appreciated, currently on IV unresponding, followed by ID, thought to be related to GI bleed. -2D echo with EF of 70%, no shunt, no vegetation could be seen. -She denies any visual complaints -On Anidulafungin , she will need total of 14 days.  Will request IJ PICC line insertion(unable to do PICC line given her CKD and renal transplant)  Anemia, possible GI bleed -Hemoglobin this morning 7.1, down from 8.5, received 1 unit  PRBC 4/6, hemoglobin stable this morning. -We will need EGD/colonoscopy, timing per GI.  Stage 4 CKD, h/o renal transplant -Continue tacrolimus, initially on stress dose steroids, now back on prednisone. 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 Tears of the TFCC articular disc and foveal component of the triangular ligament. -Have discussed MRI finding with Dr. Lenon Curt, appears to be fluid collection related to inflammatory changes, no evidence of septic findings, recommendation is for this or colchicine which will be difficult here in the setting of her renal failure, so he does recommend steroids, give IV Solu-Medrol as needed, as well she will need to keep wrist splint for extended periods.  Back pain -continue home regmine    SpO2: 98 %  Recent Labs  Lab 08/16/20 1556 08/17/20 0109 08/18/20 0232 08/19/20 0158 08/20/20 0030 08/21/20 0018  WBC 5.4 4.4 4.9 3.0* 3.3* 4.1  PLT 134* 125* 132* 141* 128* 140*  PROCALCITON 1.10  --   --   --   --   --   AST 15  --  18 16  --   --   ALT 16  --  15 16  --   --   ALKPHOS 70  --  67 62  --   --   BILITOT 1.0  --  0.5 0.4  --   --   ALBUMIN 2.6*  --  2.0* 1.9*  --   --        ABG  No results found for: PHART, PCO2ART, PO2ART, HCO3, TCO2, ACIDBASEDEF, O2SAT     Condition - Extremely Guarded  Family Communication  :  None at bedside  Code Status :  DNR  Consults  :  none  Disposition Plan  :  Status is: Inpatient  Remains inpatient appropriate because:IV treatments appropriate due to intensity of illness or inability to take PO   Dispo: The patient is from: Home              Anticipated d/c is to: Home              Patient currently is not medically stable to d/c.   Difficult to place patient No      DVT Prophylaxis  : SCDs   Lab Results  Component Value Date   PLT 140 (L) 08/21/2020    Diet :  Diet Order            Diet Carb Modified Fluid consistency: Thin; Room service appropriate? Yes  Diet effective now                  Inpatient Medications  Scheduled Meds: . carvedilol  25 mg Oral BID  .  insulin aspart  0-9 Units Subcutaneous TID WC  . insulin glargine  10 Units Subcutaneous QHS  . pantoprazole  40 mg Oral BID  . predniSONE  10 mg Oral Q breakfast  . rOPINIRole  3 mg Oral QHS  . rosuvastatin  20 mg Oral Daily  . sertraline  100 mg Oral QHS  . sodium bicarbonate  1,300 mg Oral BID  . sulfamethoxazole-trimethoprim  1 tablet Oral Once per day on Mon Wed Fri  . tacrolimus  1 mg Oral TID   Continuous Infusions: . sodium chloride 50 mL/hr at 08/19/20 1744  . anidulafungin 100 mg (08/21/20 0931)   PRN Meds:.acetaminophen **OR** acetaminophen, albuterol, calcium carbonate (dosed in mg elemental calcium), camphor-menthol **AND** hydrOXYzine, docusate sodium, feeding supplement (NEPRO CARB STEADY), guaiFENesin, hydrALAZINE, HYDROcodone-acetaminophen, ondansetron **OR** ondansetron (ZOFRAN) IV, oxyCODONE, polyethylene glycol, sorbitol, zolpidem  Antibiotics  :    Anti-infectives (From admission, onward)   Start     Dose/Rate Route Frequency Ordered Stop   08/20/20 1000  cefdinir (OMNICEF) capsule 300 mg  Status:  Discontinued        300 mg Oral Daily 08/19/20 0918 08/19/20 0936   08/20/20 1000  cefdinir (OMNICEF) capsule 300 mg        300 mg Oral Daily 08/19/20 0936 08/21/20 0922   08/19/20 1015  azithromycin (ZITHROMAX) tablet 500 mg  Status:  Discontinued        500 mg Oral Daily 08/19/20 0916 08/19/20 0936   08/19/20 1015  azithromycin (ZITHROMAX) tablet 500 mg        500 mg Oral Daily 08/19/20 0936 08/21/20 0921   08/19/20 1000  anidulafungin (ERAXIS) 100 mg in sodium chloride 0.9 % 100 mL IVPB        100 mg 78 mL/hr over 100 Minutes Intravenous Daily 08/18/20 0247 09/01/20 0959   08/18/20 0400  anidulafungin (ERAXIS) 200 mg in sodium chloride 0.9 % 200 mL IVPB        200 mg 78 mL/hr over 200 Minutes Intravenous  Once 08/18/20 0247 08/18/20 0704   08/17/20 0900  sulfamethoxazole-trimethoprim (BACTRIM) 400-80 MG per tablet 1 tablet        1 tablet Oral Once per day on Mon  Wed Fri 08/16/20 1430     08/17/20 0800  cefTRIAXone (ROCEPHIN) 2 g in sodium chloride 0.9 % 100 mL IVPB  Status:  Discontinued        2 g 200 mL/hr over 30 Minutes Intravenous Every 24 hours 08/16/20 1430 08/19/20 0918   08/17/20 0800  azithromycin (ZITHROMAX) 500  mg in sodium chloride 0.9 % 250 mL IVPB  Status:  Discontinued        500 mg 250 mL/hr over 60 Minutes Intravenous Every 24 hours 08/16/20 1430 08/19/20 0916         Ruth Riley M.D on 08/21/2020 at 12:54 PM  To page go to [email or web address]   [facility] -  Office  [phone]     Objective:   Vitals:   08/20/20 2050 08/20/20 2200 08/21/20 0347 08/21/20 0441  BP: (!) 182/87 (!) 145/81  (!) 159/94  Pulse: 69   73  Resp: 17   18  Temp: (!) 97.5 F (36.4 C)   97.6 F (36.4 C)  TempSrc: Oral   Oral  SpO2: 100%   98%  Weight:   58 kg   Height:        Wt Readings from Last 3 Encounters:  08/21/20 58 kg  08/08/20 56.7 kg  05/26/20 56.8 kg     Intake/Output Summary (Last 24 hours) at 08/21/2020 1254 Last data filed at 08/21/2020 0815 Gross per 24 hour  Intake 560 ml  Output 200 ml  Net 360 ml     Physical Exam  Awake Alert, easily distracted, no new F.N deficits, Normal affect Symmetrical Chest wall movement, Good air movement bilaterally, scattered Rales RRR,No Gallops,Rubs or new Murmurs, No Parasternal Heave +ve B.Sounds, Abd Soft, mildly distended, No rebound - guarding or rigidity. 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, with right wrist swelling and tenderness     Data Review:    CBC Recent Labs  Lab 08/16/20 1556 08/17/20 0109 08/18/20 0232 08/19/20 0158 08/20/20 0030 08/21/20 0018  WBC 5.4 4.4 4.9 3.0* 3.3* 4.1  HGB 8.5* 7.1* 8.1* 8.4* 8.5* 9.0*  HCT 26.5* 22.2* 24.2* 26.2* 26.2* 28.1*  PLT 134* 125* 132* 141* 128* 140*  MCV 93.3 93.3 86.7 87.0 88.2 88.4  MCH 29.9 29.8 29.0 27.9 28.6 28.3  MCHC 32.1 32.0 33.5 32.1 32.4 32.0   RDW 16.5* 16.5* 19.7* 19.2* 18.7* 17.9*  LYMPHSABS 0.5*  --   --   --   --   --   MONOABS 0.2  --   --   --   --   --   EOSABS 0.1  --   --   --   --   --   BASOSABS 0.0  --   --   --   --   --     Recent Labs  Lab 08/16/20 1556 08/17/20 0109 08/18/20 0232 08/19/20 0158 08/20/20 0030 08/21/20 0018  NA 135 133* 132* 135 133* 134*  K 4.8 4.9 4.2 3.4* 3.7 3.7  CL 99 100 101 101 104 104  CO2 '27 26 23 23 22 24  '$ GLUCOSE 104* 200* 229* 122* 189* 155*  BUN 56* 56* 59* 56* 53* 50*  CREATININE 3.77* 3.54* 3.48* 3.44* 3.17* 3.08*  CALCIUM 8.9 7.9* 7.7* 8.0* 7.6* 7.9*  AST 15  --  18 16  --   --   ALT 16  --  15 16  --   --   ALKPHOS 70  --  67 62  --   --   BILITOT 1.0  --  0.5 0.4  --   --   ALBUMIN 2.6*  --  2.0* 1.9*  --   --   PROCALCITON 1.10  --   --   --   --   --   TSH 2.903  --   --   --   --   --     ------------------------------------------------------------------------------------------------------------------  No results for input(s): CHOL, HDL, LDLCALC, TRIG, CHOLHDL, LDLDIRECT in the last 72 hours.  No results found for: HGBA1C ------------------------------------------------------------------------------------------------------------------ No results for input(s): TSH, T4TOTAL, T3FREE, THYROIDAB in the last 72 hours.  Invalid input(s): FREET3  Cardiac Enzymes No results for input(s): CKMB, TROPONINI, MYOGLOBIN in the last 168 hours.  Invalid input(s): CK ------------------------------------------------------------------------------------------------------------------ No results found for: BNP  Micro Results Recent Results (from the past 240 hour(s))  Culture, blood (routine x 2) Call MD if unable to obtain prior to antibiotics being given     Status: Abnormal (Preliminary result)   Collection Time: 08/16/20  3:50 PM   Specimen: BLOOD  Result Value Ref Range Status   Specimen Description BLOOD RIGHT ANTECUBITAL  Final   Special Requests   Final    BOTTLES  DRAWN AEROBIC AND ANAEROBIC Blood Culture results may not be optimal due to an inadequate volume of blood received in culture bottles   Culture  Setup Time (A)  Final    YEAST AEROBIC BOTTLE ONLY CRITICAL RESULT CALLED TO, READ BACK BY AND VERIFIED WITH: PHARMD ABBOTT [id number] FCP    Culture (A)  Final    CANDIDA ALBICANS Sent to [place] for further susceptibility testing. Performed at [facility], [place] [address] [place], [place] [address]    Report Status PENDING  Incomplete  Blood Culture ID Panel (Reflexed)     Status: Abnormal   Collection Time: 08/16/20  3:50 PM  Result Value Ref Range Status   Enterococcus faecalis NOT DETECTED NOT DETECTED Final   Enterococcus Faecium NOT DETECTED NOT DETECTED Final   Listeria monocytogenes NOT DETECTED NOT DETECTED Final   Staphylococcus species NOT DETECTED NOT DETECTED Final   Staphylococcus aureus (BCID) NOT DETECTED NOT DETECTED Final   Staphylococcus epidermidis NOT DETECTED NOT DETECTED Final   Staphylococcus lugdunensis NOT DETECTED NOT DETECTED Final   Streptococcus species NOT DETECTED NOT DETECTED Final   Streptococcus agalactiae NOT DETECTED NOT DETECTED Final   Streptococcus pneumoniae NOT DETECTED NOT DETECTED Final   Streptococcus pyogenes NOT DETECTED NOT DETECTED Final   A.calcoaceticus-baumannii NOT DETECTED NOT DETECTED Final   Bacteroides fragilis NOT DETECTED NOT DETECTED Final   Enterobacterales NOT DETECTED NOT DETECTED Final   Enterobacter cloacae complex NOT DETECTED NOT DETECTED Final   Escherichia coli NOT DETECTED NOT DETECTED Final   Klebsiella aerogenes NOT DETECTED NOT DETECTED Final   Klebsiella oxytoca NOT DETECTED NOT DETECTED Final   Klebsiella pneumoniae NOT DETECTED NOT DETECTED Final   Proteus species NOT DETECTED NOT DETECTED Final   Salmonella species NOT DETECTED NOT DETECTED Final   Serratia marcescens NOT DETECTED NOT DETECTED Final   Haemophilus influenzae NOT DETECTED NOT DETECTED  Final   Neisseria meningitidis NOT DETECTED NOT DETECTED Final   Pseudomonas aeruginosa NOT DETECTED NOT DETECTED Final   Stenotrophomonas maltophilia NOT DETECTED NOT DETECTED Final   Candida albicans DETECTED (A) NOT DETECTED Final    Comment: CRITICAL RESULT CALLED TO, READ BACK BY AND VERIFIED WITH: PHARMD ABBOTT [id number] FCP    Candida auris NOT DETECTED NOT DETECTED Final   Candida glabrata NOT DETECTED NOT DETECTED Final   Candida krusei NOT DETECTED NOT DETECTED Final   Candida parapsilosis NOT DETECTED NOT DETECTED Final   Candida tropicalis NOT DETECTED NOT DETECTED Final   Cryptococcus neoformans/gattii NOT DETECTED NOT DETECTED Final    Comment: Performed at [facility], [address] [place], [place] [address]  Culture, blood (routine x 2) Call MD  if unable to obtain prior to antibiotics being given     Status: Abnormal   Collection Time: 08/16/20  3:52 PM   Specimen: BLOOD LEFT ARM  Result Value Ref Range Status   Specimen Description BLOOD LEFT ARM  Final   Special Requests   Final    BOTTLES DRAWN AEROBIC AND ANAEROBIC Blood Culture results may not be optimal due to an inadequate volume of blood received in culture bottles   Culture  Setup Time (A)  Final    YEAST AEROBIC BOTTLE ONLY CRITICAL VALUE NOTED.  VALUE IS CONSISTENT WITH PREVIOUSLY REPORTED AND CALLED VALUE. Performed at [facility], [place] [address] [place], [place] [address]    Culture CANDIDA ALBICANS (A)  Final   Report Status 08/21/2020 FINAL  Final  Culture, blood (routine x 2)     Status: None (Preliminary result)   Collection Time: 08/18/20  7:51 AM   Specimen: BLOOD  Result Value Ref Range Status   Specimen Description BLOOD RIGHT ANTECUBITAL  Final   Special Requests   Final    BOTTLES DRAWN AEROBIC AND ANAEROBIC Blood Culture adequate volume   Culture   Final    NO GROWTH 3 DAYS Performed at [facility], [place] [address] [place], [place] [address]    Report Status  PENDING  Incomplete  Culture, blood (routine x 2)     Status: None (Preliminary result)   Collection Time: 08/18/20  8:03 AM   Specimen: BLOOD LEFT WRIST  Result Value Ref Range Status   Specimen Description BLOOD LEFT WRIST  Final   Special Requests   Final    BOTTLES DRAWN AEROBIC AND ANAEROBIC Blood Culture adequate volume   Culture   Final    NO GROWTH 3 DAYS Performed at [facility], [place] [address], [place], [place] [address]    Report Status PENDING  Incomplete    Radiology Reports MR HAND RIGHT WO CONTRAST  Result Date: 08/20/2020 CLINICAL DATA:  Right hand and wrist pain.  Recent falls. EXAM: MRI OF THE RIGHT HAND WITHOUT CONTRAST; MRI OF THE RIGHT WRIST WITHOUT CONTRAST TECHNIQUE: Multiplanar, multisequence MR imaging of the right hand and wrist was performed. No intravenous contrast was administered. COMPARISON:  Right hand x-rays dated August 01, 2020. FINDINGS: Ligaments: Chronic complete tear of the scapholunate ligament with widening of the scapholunate interval. The lunotriquetral ligament is intact. Triangular fibrocartilage: There is a small partial tear of the articular disc ulnar surface. The foveal component of the triangular ligament is completely torn. Slight ulnar minus variance. Tendons: Flexor and extensor tendons are intact. Large amount of fluid in the flexor tendon sheaths. Mild extensor carpi ulnaris tendinosis with small amount of fluid in the tendon sheath. Carpal tunnel/median nerve: Unremarkable. Guyon's canal: Unremarkable. Joint/cartilage: Scattered cartilage loss about the wrist with large areas of full-thickness cartilage loss in the radioscaphoid and scaphotrapeziotrapezoid joints. Scattered small carpal bone erosions. Additional small erosions of the second and third MCP joints with synovial thickening. Large radiocarpal and midcarpal joint effusions.Small distal radioulnar joint effusion. 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 Moderate first  and third MCP joint osteoarthritis. Moderate to severe DIP joint osteoarthritis. Bones/carpal alignment: Chronic widening of the scapholunate interval. Early proximal migration of the capitate.No suspicious marrow signal abnormality. No acute fracture or dislocation. Other: Severe soft tissue swelling of the wrist and hand. No fluid collection. IMPRESSION: 1. No acute abnormality.  No fracture or evidence of osteomyelitis. 2. 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 3. Tears of the TFCC articular disc and foveal component of the triangular ligament. 4. Early SLAC wrist. 5. 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 6. Severe soft tissue swelling of the wrist and hand. No fluid collection. Electronically Signed   By: Titus Dubin M.D.   On: 08/20/2020 09:24   DG Hand 2 View Left  Result Date: 08/19/2020 CLINICAL DATA:  Pain EXAM: LEFT HAND - 2 VIEW COMPARISON:  None. FINDINGS: Frontal and lateral views obtained. There has been previous apparent removal of the trapezium bone. Postoperative changes noted in the proximal second metacarpal and trapezoid bones. Minimal calcification is noted in the expected location of the trapezium bone. No acute fracture or dislocation. Bones are osteoporotic. There is marked narrowing of all D IP joints with spurring in all of these joints. There is moderate narrowing in the MCP and PIP joints. No erosion or periostitis evident. Bones are somewhat osteopenic IMPRESSION: 1. Apparent previous removal of the trapezium bone with a few tiny calcifications in this area. 2. 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 No erosions. 3.  No acute fracture or dislocation. 4.  Bones osteoporotic.  Electronically Signed   By: Lowella Grip III M.D.   On: 08/19/2020 13:53   MR WRIST RIGHT WO CONTRAST  Result Date: 08/20/2020 CLINICAL DATA:  Right hand and wrist pain.  Recent falls. EXAM: MRI OF THE RIGHT HAND WITHOUT CONTRAST; MRI OF THE RIGHT WRIST WITHOUT CONTRAST TECHNIQUE: Multiplanar, multisequence MR imaging of the right hand and wrist was performed. No intravenous contrast was administered. COMPARISON:  Right hand x-rays dated August 01, 2020. FINDINGS: Ligaments: Chronic complete tear of the scapholunate ligament with widening of the scapholunate interval. The lunotriquetral ligament is intact. Triangular fibrocartilage: There is a small partial tear of the articular disc ulnar surface. The foveal component of the triangular ligament is completely torn. Slight ulnar minus variance. Tendons: Flexor and extensor tendons are intact. Large amount of fluid in the flexor tendon sheaths. Mild extensor carpi ulnaris tendinosis with small amount of fluid in the tendon sheath. Carpal tunnel/median nerve: Unremarkable. Guyon's canal: Unremarkable. Joint/cartilage: Scattered cartilage loss about the wrist with large areas of full-thickness cartilage loss in the radioscaphoid and scaphotrapeziotrapezoid joints. Scattered small carpal bone erosions. Additional small erosions of the second and third MCP joints with synovial thickening. Large radiocarpal and midcarpal joint effusions.Small distal radioulnar joint effusion. 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 Moderate first and third MCP joint osteoarthritis. Moderate to severe DIP joint osteoarthritis. Bones/carpal alignment: Chronic widening of the scapholunate interval. Early proximal migration of the capitate.No suspicious marrow signal abnormality. No acute fracture or dislocation. Other: Severe soft tissue swelling of the wrist and hand. No fluid collection. IMPRESSION: 1. No acute abnormality.  No fracture or evidence of osteomyelitis. 2.  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 3. Tears of the TFCC articular disc and foveal component of the triangular ligament. 4. Early SLAC wrist. 5. 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 6. Severe soft tissue swelling of the wrist and hand.  No fluid collection. Electronically Signed   By: Titus Dubin M.D.   On: 08/20/2020 09:24   DG Chest Port 1 View  Result Date: 08/17/2020 CLINICAL DATA:  Dyspnea. EXAM: PORTABLE CHEST 1 VIEW COMPARISON:  August 15, 2020. FINDINGS: Stable cardiomediastinal silhouette. No pneumothorax or pleural effusion is noted. No consolidative process is noted. Bony thorax is unremarkable. IMPRESSION: No active disease. Electronically Signed   By: Marijo Conception M.D.   On: 08/17/2020 08:46   ECHOCARDIOGRAM COMPLETE  Result Date: 08/19/2020    ECHOCARDIOGRAM REPORT   Patient Name:   BERENIZ TOCCI [facility] Date of Exam: 08/19/2020 Medical Rec #:  [id number]              Height:       61.0 in Accession #:    [id number]             Weight:       126.9 lb Date of Birth:  1946-03-05              BSA:          1.557 m Patient Age:    77 years               BP:           135/85 mmHg Patient Gender: F                      HR:           74 bpm. Exam Location:  Inpatient Procedure: 2D Echo, Cardiac Doppler and Color Doppler Indications:     Candidemia  History:         Patient has no prior history of Echocardiogram examinations.                  Risk Factors:Hypertension and Diabetes.  Sonographer:     [place], Clara, RVT, RDCS Referring Phys:  [id number] [facility] Diagnosing Phys: Oswaldo Milian MD  Sonographer Comments: Restricted mobility; unable to turn patient due to shoulder injury.  IMPRESSIONS  1. Left ventricular ejection fraction, by estimation, is 70 to 75%. The left ventricle has hyperdynamic function. The left ventricle has no regional wall motion abnormalities. Left ventricular diastolic parameters are consistent with Grade II diastolic dysfunction (pseudonormalization). Elevated left atrial pressure. Hyperdynamic LV function with mid cavitary/LVOT gradient up to 23 mmHg  2. Right ventricular systolic function is normal. The right ventricular size is normal. There is mildly elevated pulmonary artery systolic pressure. The estimated right ventricular systolic pressure is 0000000 mmHg.  3. Right atrial size was mildly dilated.  4. The mitral valve is normal in structure. No evidence of mitral valve regurgitation.  5. The aortic valve was not well visualized. Aortic valve regurgitation is trivial. No aortic stenosis is present.  6. The inferior vena cava is normal in size with greater than 50% respiratory variability, suggesting right atrial pressure of 3 mmHg. Conclusion(s)/Recommendation(s): No vegetation seen, but technically difficult study. If high clinical suspicion for endocarditis, recommend TEE. FINDINGS  Left Ventricle: Left ventricular ejection fraction, by estimation, is 70 to 75%. The left ventricle has hyperdynamic function. The left ventricle has no regional wall motion abnormalities. The left ventricular internal cavity size was small. There is no  left ventricular hypertrophy. Left ventricular diastolic parameters are consistent with Grade II diastolic dysfunction (pseudonormalization). Elevated left atrial pressure. Right Ventricle: The right ventricular size is normal. No increase in right ventricular wall thickness. Right ventricular  systolic function is normal. There is mildly elevated pulmonary artery systolic pressure. The tricuspid regurgitant velocity is 2.88  m/s, and with an assumed right atrial pressure of 3 mmHg, the estimated right ventricular systolic pressure is  0000000 mmHg. Left Atrium: Left atrial size was normal in size. Right Atrium: Right atrial size was mildly dilated. Pericardium: There is no evidence of pericardial effusion. Mitral Valve: The mitral valve is normal in structure. No evidence of mitral valve regurgitation. MV peak gradient, 17.8 mmHg. The mean mitral valve gradient is 5.0 mmHg. Tricuspid Valve: The tricuspid valve is normal in structure. Tricuspid valve regurgitation is trivial. Aortic Valve: The aortic valve was not well visualized. Aortic valve regurgitation is trivial. No aortic stenosis is present. Pulmonic Valve: The pulmonic valve was not well visualized. Pulmonic valve regurgitation is not visualized. Aorta: The aortic root is normal in size and structure. Venous: The inferior vena cava is normal in size with greater than 50% respiratory variability, suggesting right atrial pressure of 3 mmHg. IAS/Shunts: The interatrial septum was not well visualized.  LEFT VENTRICLE PLAX 2D LVIDd:         3.20 cm  Diastology LVIDs:         2.20 cm  LV e' medial:    3.70 cm/s LV PW:         0.90 cm  LV E/e' medial:  26.3 LV IVS:        0.70 cm  LV e' lateral:   4.03 cm/s LVOT diam:     1.30 cm  LV E/e' lateral: 24.2 LV SV:         44 LV SV Index:   28 LVOT Area:     1.33 cm  RIGHT VENTRICLE RV S prime:     13.30 cm/s TAPSE (M-mode): 1.6 cm LEFT ATRIUM             Index       RIGHT ATRIUM           Index LA diam:        3.50 cm 2.25 cm/m  RA Area:     16.70 cm LA Vol (A2C):   29.5 ml 18.95 ml/m RA Volume:   43.60 ml  28.01 ml/m LA Vol (A4C):   39.1 ml 25.12 ml/m LA Biplane Vol: 33.4 ml 21.46 ml/m  AORTIC VALVE LVOT Vmax:   194.00 cm/s LVOT Vmean:  146.000 cm/s LVOT VTI:    0.333 m  AORTA Ao Root diam: 2.70 cm MITRAL VALVE                TRICUSPID VALVE MV Area (PHT): 2.21 cm     TR Peak grad:   33.2 mmHg MV Area VTI:   0.71 cm     TR Vmax:        288.00 cm/s MV Peak grad:  17.8 mmHg MV Mean grad:  5.0 mmHg     SHUNTS MV Vmax:       2.11 m/s     Systemic  VTI:  0.33 m MV Vmean:      105.0 cm/s   Systemic Diam: 1.30 cm MV Decel Time: 343 msec MV E velocity: 97.40 cm/s MV A velocity: 178.00 cm/s MV E/A ratio:  0.55 Oswaldo Milian MD Electronically signed by Oswaldo Milian MD Signature Date/Time: 08/19/2020/9:03:51 PM    Final (Updated)

## 2020-08-22 ENCOUNTER — Inpatient Hospital Stay (HOSPITAL_COMMUNITY): Payer: Medicare Other

## 2020-08-22 DIAGNOSIS — B3789 Other sites of candidiasis: Secondary | ICD-10-CM | POA: Diagnosis not present

## 2020-08-22 DIAGNOSIS — K6289 Other specified diseases of anus and rectum: Secondary | ICD-10-CM

## 2020-08-22 DIAGNOSIS — R195 Other fecal abnormalities: Secondary | ICD-10-CM

## 2020-08-22 DIAGNOSIS — Z940 Kidney transplant status: Secondary | ICD-10-CM | POA: Diagnosis not present

## 2020-08-22 DIAGNOSIS — N185 Chronic kidney disease, stage 5: Secondary | ICD-10-CM | POA: Diagnosis not present

## 2020-08-22 DIAGNOSIS — G9341 Metabolic encephalopathy: Secondary | ICD-10-CM | POA: Diagnosis not present

## 2020-08-22 DIAGNOSIS — D8481 Immunodeficiency due to conditions classified elsewhere: Secondary | ICD-10-CM | POA: Diagnosis not present

## 2020-08-22 DIAGNOSIS — B49 Unspecified mycosis: Secondary | ICD-10-CM | POA: Diagnosis not present

## 2020-08-22 DIAGNOSIS — K921 Melena: Secondary | ICD-10-CM | POA: Diagnosis not present

## 2020-08-22 HISTORY — PX: IR US GUIDE VASC ACCESS RIGHT: IMG2390

## 2020-08-22 HISTORY — PX: IR FLUORO GUIDE CV LINE RIGHT: IMG2283

## 2020-08-22 LAB — CBC
HCT: 29.9 % — ABNORMAL LOW (ref 36.0–46.0)
Hemoglobin: 9.3 g/dL — ABNORMAL LOW (ref 12.0–15.0)
MCH: 27.8 pg (ref 26.0–34.0)
MCHC: 31.1 g/dL (ref 30.0–36.0)
MCV: 89.3 fL (ref 80.0–100.0)
Platelets: 148 10*3/uL — ABNORMAL LOW (ref 150–400)
RBC: 3.35 MIL/uL — ABNORMAL LOW (ref 3.87–5.11)
RDW: 17.7 % — ABNORMAL HIGH (ref 11.5–15.5)
WBC: 4.7 10*3/uL (ref 4.0–10.5)
nRBC: 0 % (ref 0.0–0.2)

## 2020-08-22 LAB — GLUCOSE, CAPILLARY
Glucose-Capillary: 183 mg/dL — ABNORMAL HIGH (ref 70–99)
Glucose-Capillary: 95 mg/dL (ref 70–99)
Glucose-Capillary: 208 mg/dL — ABNORMAL HIGH (ref 70–99)
Glucose-Capillary: 105 mg/dL — ABNORMAL HIGH (ref 70–99)

## 2020-08-22 LAB — BASIC METABOLIC PANEL
Anion gap: 8 (ref 5–15)
BUN: 52 mg/dL — ABNORMAL HIGH (ref 8–23)
CO2: 20 mmol/L — ABNORMAL LOW (ref 22–32)
Calcium: 7.6 mg/dL — ABNORMAL LOW (ref 8.9–10.3)
Chloride: 104 mmol/L (ref 98–111)
Creatinine, Ser: 3.01 mg/dL — ABNORMAL HIGH (ref 0.44–1.00)
GFR, Estimated: 16 mL/min — ABNORMAL LOW (ref >60)
Glucose, Bld: 261 mg/dL — ABNORMAL HIGH (ref 70–99)
Potassium: 4.3 mmol/L (ref 3.5–5.1)
Sodium: 132 mmol/L — ABNORMAL LOW (ref 135–145)

## 2020-08-22 MED ORDER — PEG-KCL-NACL-NASULF-NA ASC-C 100 G PO SOLR
0.5000 | Freq: Once | ORAL | Status: AC
  Administered 2020-08-22: 100 g via ORAL
  Filled 2020-08-22: qty 1

## 2020-08-22 MED ORDER — LIP MEDEX EX OINT
TOPICAL_OINTMENT | CUTANEOUS | Status: DC | PRN
  Filled 2020-08-22: qty 7

## 2020-08-22 MED ORDER — PEG-KCL-NACL-NASULF-NA ASC-C 100 G PO SOLR
0.5000 | Freq: Once | ORAL | Status: DC
  Administered 2020-08-22: 100 g via ORAL
  Filled 2020-08-22: qty 1

## 2020-08-22 MED ORDER — METOCLOPRAMIDE HCL 5 MG/ML IJ SOLN
10.0000 mg | Freq: Once | INTRAMUSCULAR | Status: AC
  Administered 2020-08-22: 10 mg via INTRAVENOUS
  Filled 2020-08-22: qty 2

## 2020-08-22 MED ORDER — BISACODYL 5 MG PO TBEC
10.0000 mg | DELAYED_RELEASE_TABLET | Freq: Four times a day (QID) | ORAL | Status: AC
  Administered 2020-08-22 (×2): 10 mg via ORAL
  Filled 2020-08-22 (×2): qty 2

## 2020-08-22 MED ORDER — LIDOCAINE HCL 1 % IJ SOLN
INTRAMUSCULAR | Status: DC | PRN
  Administered 2020-08-22: 10 mL

## 2020-08-22 MED ORDER — PEG-KCL-NACL-NASULF-NA ASC-C 100 G PO SOLR: 1.0000 | Freq: Once | ORAL | Status: DC

## 2020-08-22 MED ORDER — CHLORHEXIDINE GLUCONATE CLOTH 2 % EX PADS
6.0000 | MEDICATED_PAD | Freq: Every day | CUTANEOUS | Status: DC
  Administered 2020-08-22 – 2020-09-01 (×10): 6 via TOPICAL

## 2020-08-22 MED ORDER — LIDOCAINE HCL 1 % IJ SOLN
INTRAMUSCULAR | Status: AC
  Filled 2020-08-22: qty 20

## 2020-08-22 NOTE — Anesthesia Preprocedure Evaluation (Addendum)
Anesthesia Evaluation  Patient identified by MRN, date of birth, ID band Patient awake    Reviewed: Allergy & Precautions, NPO status , Patient's Chart, lab work & pertinent test results, reviewed documented beta blocker date and time   Airway Mallampati: IV  TM Distance: >3 FB Neck ROM: Full  Mouth opening: Limited Mouth Opening  Dental no notable dental hx. (+) Teeth Intact, Dental Advisory Given   Pulmonary neg pulmonary ROS,    Pulmonary exam normal breath sounds clear to auscultation       Cardiovascular hypertension, Pt. on medications and Pt. on home beta blockers +CHF (grade 2 diastolic dysfunction)  Normal cardiovascular exam Rhythm:Regular Rate:Normal  Echo 08/19/20: 1. Left ventricular ejection fraction, by estimation, is 70 to 75%. The  left ventricle has hyperdynamic function. The left ventricle has no  regional wall motion abnormalities. Left ventricular diastolic parameters  are consistent with Grade II diastolic  dysfunction (pseudonormalization). Elevated left atrial pressure.  Hyperdynamic LV function with mid cavitary/LVOT gradient up to 23 mmHg  2. Right ventricular systolic function is normal. The right ventricular  size is normal. There is mildly elevated pulmonary artery systolic  pressure. The estimated right ventricular systolic pressure is 22.2 mmHg.  3. Right atrial size was mildly dilated.  4. The mitral valve is normal in structure. No evidence of mitral valve  regurgitation.  5. The aortic valve was not well visualized. Aortic valve regurgitation  is trivial. No aortic stenosis is present.  6. The inferior vena cava is normal in size with greater than 50%  respiratory variability, suggesting right atrial pressure of 3 mmHg.    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  negative genitourinary   Musculoskeletal negative musculoskeletal ROS (+)   Abdominal   Peds  Hematology  (+) Blood dyscrasia, anemia , 9.3/29.9, plt 148 Currently being treated for fungemia   Anesthesia Other Findings EGD for heme positive stool  Reproductive/Obstetrics negative OB ROS                          Anesthesia Physical Anesthesia Plan  ASA: III  Anesthesia Plan: MAC   Post-op Pain Management:    Induction:   PONV Risk Score and Plan: 2 and Propofol infusion and TIVA  Airway Management Planned: Natural Airway and Simple Face Mask  Additional Equipment: None  Intra-op Plan:   Post-operative Plan:   Informed Consent: I have reviewed the patients History and Physical, chart, labs and discussed the procedure including the risks, benefits and alternatives for the proposed anesthesia with the patient or authorized representative who has indicated his/her understanding and acceptance.   Patient has DNR.  Discussed DNR with patient and Suspend DNR.   Dental advisory given  Plan Discussed with: CRNA  Anesthesia Plan Comments:        Anesthesia Quick Evaluation

## 2020-08-22 NOTE — H&P (View-Only) (Signed)
Daily Rounding Note  08/22/2020, 9:12 AM  LOS: 5 days   SUBJECTIVE:   Chief complaint:  Acute on chronic anemia    Large, brown, soft stool last night.  No nausea or vomiting.  No abdominal pain.  Overall feeling better but not feeling great.  Turns out she does not have a fracture of the left finger.  Continues having pain in the right wrist and left finger.  OBJECTIVE:         Vital signs in last 24 hours:    Temp:  [98.1 F (36.7 C)-98.2 F (36.8 C)] 98.2 F (36.8 C) (04/11 0535) Pulse Rate:  [67-76] 70 (04/11 0535) Resp:  [18] 18 (04/11 0535) BP: (123-163)/(79-96) 125/80 (04/11 0535) SpO2:  [97 %-98 %] 98 % (04/11 0535) Last BM Date: 08/21/20 Filed Weights   08/16/20 1328 08/20/20 0339 08/21/20 0347  Weight: 57.6 kg 57 kg 58 kg     Heart: Looks better but still looks chronically ill, pale.  Comfortable, alert. Chest: Clear bilaterally with reduced breath sounds.  No labored breathing or cough Abdomen: Soft, active bowel sounds, nondistended.  Minimal tenderness in the left mid/upper quadrant.  No guarding or rebound.  Bowel sounds active Extremities: Bruising on arms.  Swelling in the right wrist/hand without erythema. Neuro/Psych: Oriented x3.  Appropriate.  Fluid speech.  Engaged.  Intake/Output from previous day: 04/10 0701 - 04/11 0700 In: 240 [P.O.:240] Out: -   Intake/Output this shift: No intake/output data recorded.  Lab Results: Recent Labs    08/20/20 0030 08/21/20 0018 08/22/20 0045  WBC 3.3* 4.1 4.7  HGB 8.5* 9.0* 9.3*  HCT 26.2* 28.1* 29.9*  PLT 128* 140* 148*   BMET Recent Labs    08/20/20 0030 08/21/20 0018 08/22/20 0045  NA 133* 134* 132*  K 3.7 3.7 4.3  CL 104 104 104  CO2 22 24 20*  GLUCOSE 189* 155* 261*  BUN 53* 50* 52*  CREATININE 3.17* 3.08* 3.01*  CALCIUM 7.6* 7.9* 7.6*   Scheduled Meds: . carvedilol  25 mg Oral BID  . insulin aspart  0-9 Units Subcutaneous TID  WC  . insulin glargine  10 Units Subcutaneous QHS  . pantoprazole  40 mg Oral BID  . predniSONE  10 mg Oral Q breakfast  . rOPINIRole  3 mg Oral QHS  . rosuvastatin  20 mg Oral Daily  . sertraline  100 mg Oral QHS  . sodium bicarbonate  1,300 mg Oral BID  . sulfamethoxazole-trimethoprim  1 tablet Oral Once per day on Mon Wed Fri  . tacrolimus  1 mg Oral TID   Continuous Infusions: . sodium chloride 50 mL/hr at 08/21/20 2017  . anidulafungin 100 mg (08/21/20 0931)   PRN Meds:.acetaminophen **OR** acetaminophen, albuterol, calcium carbonate (dosed in mg elemental calcium), camphor-menthol **AND** hydrOXYzine, docusate sodium, feeding supplement (NEPRO CARB STEADY), guaiFENesin, hydrALAZINE, HYDROcodone-acetaminophen, ondansetron **OR** ondansetron (ZOFRAN) IV, oxyCODONE, polyethylene glycol, sorbitol, zolpidem  ASSESMENT:   *   Acute on chronic anemia.   FOBT positive.  1 PRBC. Continues on home dose of Protonix 40 po bid.     *   Stage 5 CKD and failing renal transplant.  Polycystic kidney disease.  On immunosuppressive's. AKI improved, still stage 5 CKD.  Urine output not being strictly recorded.  *    CAP, bilateral.  *   Carpal tunnel syndrome, right wrist, left finger fracture.  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  Eraxis in place.  ID following.  *    AME.   *    IDDM.   PLAN   *   Patient agreeable to proceed with EGD and colonoscopy tomorrow.  Is a bit fearful that she will have nausea with the prep.  I assured her that this is the case she can drink it slowly.  Also ordered Reglan at start of each phase of bowel prep.  *   Patient is going for tunneled IV catheter (not an HD type catheter) in IR today.    Azucena Freed  08/22/2020, 9:12 AM Phone [phone]

## 2020-08-22 NOTE — Progress Notes (Signed)
Daily Rounding Note  08/22/2020, 9:12 AM  LOS: 5 days   SUBJECTIVE:   Chief complaint:  Acute on chronic anemia    Large, brown, soft stool last night.  No nausea or vomiting.  No abdominal pain.  Overall feeling better but not feeling great.  Turns out she does not have a fracture of the left finger.  Continues having pain in the right wrist and left finger.  OBJECTIVE:         Vital signs in last 24 hours:    Temp:  [98.1 F (36.7 C)-98.2 F (36.8 C)] 98.2 F (36.8 C) (04/11 0535) Pulse Rate:  [67-76] 70 (04/11 0535) Resp:  [18] 18 (04/11 0535) BP: (123-163)/(79-96) 125/80 (04/11 0535) SpO2:  [97 %-98 %] 98 % (04/11 0535) Last BM Date: 08/21/20 Filed Weights   08/16/20 1328 08/20/20 0339 08/21/20 0347  Weight: 57.6 kg 57 kg 58 kg     Heart: Looks better but still looks chronically ill, pale.  Comfortable, alert. Chest: Clear bilaterally with reduced breath sounds.  No labored breathing or cough Abdomen: Soft, active bowel sounds, nondistended.  Minimal tenderness in the left mid/upper quadrant.  No guarding or rebound.  Bowel sounds active Extremities: Bruising on arms.  Swelling in the right wrist/hand without erythema. Neuro/Psych: Oriented x3.  Appropriate.  Fluid speech.  Engaged.  Intake/Output from previous day: 04/10 0701 - 04/11 0700 In: 240 [P.O.:240] Out: -   Intake/Output this shift: No intake/output data recorded.  Lab Results: Recent Labs    08/20/20 0030 08/21/20 0018 08/22/20 0045  WBC 3.3* 4.1 4.7  HGB 8.5* 9.0* 9.3*  HCT 26.2* 28.1* 29.9*  PLT 128* 140* 148*   BMET Recent Labs    08/20/20 0030 08/21/20 0018 08/22/20 0045  NA 133* 134* 132*  K 3.7 3.7 4.3  CL 104 104 104  CO2 22 24 20*  GLUCOSE 189* 155* 261*  BUN 53* 50* 52*  CREATININE 3.17* 3.08* 3.01*  CALCIUM 7.6* 7.9* 7.6*   Scheduled Meds: . carvedilol  25 mg Oral BID  . insulin aspart  0-9 Units Subcutaneous TID  WC  . insulin glargine  10 Units Subcutaneous QHS  . pantoprazole  40 mg Oral BID  . predniSONE  10 mg Oral Q breakfast  . rOPINIRole  3 mg Oral QHS  . rosuvastatin  20 mg Oral Daily  . sertraline  100 mg Oral QHS  . sodium bicarbonate  1,300 mg Oral BID  . sulfamethoxazole-trimethoprim  1 tablet Oral Once per day on Mon Wed Fri  . tacrolimus  1 mg Oral TID   Continuous Infusions: . sodium chloride 50 mL/hr at 08/21/20 2017  . anidulafungin 100 mg (08/21/20 0931)   PRN Meds:.acetaminophen **OR** acetaminophen, albuterol, calcium carbonate (dosed in mg elemental calcium), camphor-menthol **AND** hydrOXYzine, docusate sodium, feeding supplement (NEPRO CARB STEADY), guaiFENesin, hydrALAZINE, HYDROcodone-acetaminophen, ondansetron **OR** ondansetron (ZOFRAN) IV, oxyCODONE, polyethylene glycol, sorbitol, zolpidem  ASSESMENT:   *   Acute on chronic anemia.   FOBT positive.  1 PRBC. Continues on home dose of Protonix 40 po bid.     *   Stage 5 CKD and failing renal transplant.  Polycystic kidney disease.  On immunosuppressive's. AKI improved, still stage 5 CKD.  Urine output not being strictly recorded.  *    CAP, bilateral.  *   Carpal tunnel syndrome, right wrist, left finger fracture.  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  Eraxis in place.  ID following.  *    AME.   *    IDDM.   PLAN   *   Patient agreeable to proceed with EGD and colonoscopy tomorrow.  Is a bit fearful that she will have nausea with the prep.  I assured her that this is the case she can drink it slowly.  Also ordered Reglan at start of each phase of bowel prep.  *   Patient is going for tunneled IV catheter (not an HD type catheter) in IR today.    Azucena Freed  08/22/2020, 9:12 AM Phone [phone]

## 2020-08-22 NOTE — Consult Note (Signed)
Ruth Riley is a 75 y.o. female with history of polycystic kidney disease s/p renal transplant CKD 5 with failing kidney on IS admitted for fungemia  Subjective: mild tearing but no significant changes in vision OU  Objective:  VAcc (near): OD 20/25, OS 20/20 Tp: OD 23, OS 64mHg Pupils: PERRL, neg APD OU Anterior segment: PCIOL OU DFE: vitreous clear without cell/haze OU, c/d 0.4 OU, macula flat OU, vessels w/ mild narrowing OU, periphery 1/4q w/ IRH and no retinitis OU  A/P: 1. 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 Mild diabetic retinopathy- cont BS/BP/chol control and f/u w/ regular eye doctor for annual DFE  PAlroy Dust MD 4.11.22 at 4:45pm

## 2020-08-22 NOTE — Progress Notes (Signed)
Physical Therapy Treatment Patient Details Name: Ruth Riley MRN: [id number] DOB: 01-09-1946 Today's Date: 08/22/2020    History of Present Illness Pt is 75 y.o. female who presented to [facility] with confusion, SoB, and cough and was transferred to [facility] and admitted 08/16/20. Pt being treated for anemia, bilateral PNA and possible GI bleed.  Pt thought to have fx L 4th finger and was splinted - later found to not be broken and splint off.  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  PMH: DM; HTN; and h/o renal transplant with current stage 4 CKD (baseline 3.7) with anemia (baseline 9), COVID infection in early February. hx of multiple falls and orthopedic complaints most recently L ring finger fx.    PT Comments    Pt making good progress today.  She had decreased pain in R wrist and was able to use it to stabilize on RW for transfers. Pt does not have any weight bearing restrictions per chart or her recall. Did still need mod-mod A of 2 for transfers and ambulation, increased times, and cues for technique.  Continue to recommend SNF at d/c due to level of assist required.     Follow Up Recommendations  SNF     Equipment Recommendations  Other (comment) (has DME)    Recommendations for Other Services       Precautions / Restrictions Precautions Precautions: Fall Precaution Comments: multple falls, increased pain medication Required Braces or Orthoses: Other Brace Splint/Cast: R wrist brace at all times; elevated R UE Restrictions Weight Bearing Restrictions: No Other Position/Activity Restrictions: No weightbearing restrictions ordered - pt unaware of any restrictions and reports  R wrist injury present prior to admission    Mobility  Bed Mobility Overal bed mobility: Needs Assistance Bed Mobility: Supine to Sit     Supine to sit: Mod assist;+2 for safety/equipment     General bed mobility comments: roll to L with min A to maintain position and mod A to lift trunk    Transfers Overall transfer level: Needs assistance Equipment used: Rolling walker (2 wheeled);2 person hand held assist Transfers: Sit to/from [facility] Sit to Stand: Mod assist;+2 safety/equipment;+2 physical assistance Stand pivot transfers: Mod assist;+2 safety/equipment;+2 physical assistance       General transfer comment: Cues for hand placement and multiple attempt to rise; mod A of 2 to rise; performed sit to stand with pivot to [facility] with HHA of 2; performed sit to stand to move to recliner with RW with cues and assist  Ambulation/Gait Ambulation/Gait assistance: Mod assist Gait Distance (Feet): 5 Feet Assistive device: Rolling walker (2 wheeled) Gait Pattern/deviations: Step-to pattern;Decreased stride length Gait velocity: slowed   General Gait Details: Unsteadiness requiring mod A for balance and RW control.  Pt was able to hold walker with both hands today - limited weight into R side due to pain/brace   Stairs             Wheelchair Mobility    Modified Rankin (Stroke Patients Only)       Balance Overall balance assessment: Needs assistance Sitting-balance support: Feet supported;No upper extremity supported Sitting balance-Leahy Scale: Fair Sitting balance - Comments: Able to maintain balance sitting at edge of chair; Cues to shift side  to side to slide back in chair - mod A to complete slide back   Standing balance support: Bilateral upper extremity supported;During functional activity Standing balance-Leahy Scale: Poor Standing balance comment: pt requires outside support to steady                            Cognition  Arousal/Alertness: Awake/alert Behavior During Therapy: WFL for tasks assessed/performed Overall Cognitive Status: Impaired/Different from baseline Area of Impairment: Following commands;Problem solving;Memory                     Memory: Decreased short-term memory;Decreased recall of precautions Following Commands: Follows one step commands with increased time;Follows multi-step commands inconsistently;Follows multi-step commands with increased time     Problem Solving: Requires verbal cues;Requires tactile cues        Exercises General Exercises - Lower Extremity Ankle Circles/Pumps: AROM;Both;20 reps;Seated Long Arc Quad: AROM;Both;10 reps;Seated Heel Slides: AAROM;Both;10 reps;Supine Hip ABduction/ADduction: AAROM;Both;10 reps;Supine Other Exercises Other Exercises: Pillow squeeze hip abd in sitting x 10 Other Exercises: Cues for all exercises  for ROM as able and to limit compensation techniques    General Comments        Pertinent Vitals/Pain Pain Assessment: Faces Faces Pain Scale: Hurts little more Pain Location: low back; R hand/wrist Pain Descriptors / Indicators: Guarding;Discomfort Pain Intervention(s): Limited activity within patient's tolerance;Monitored during session;Repositioned;RN gave pain meds during session (felt better OOB)    Home Living                      Prior Function            PT Goals (current goals can now be found in the care plan section) Acute Rehab PT Goals Patient Stated Goal: feel better PT Goal Formulation: With patient Time For Goal Achievement: 08/31/20 Potential to Achieve Goals: Good Progress towards PT goals: Progressing toward goals    Frequency    Min 2X/week      PT Plan Frequency needs to be updated    Co-evaluation PT/OT/SLP Co-Evaluation/Treatment: Yes Reason for Co-Treatment: Complexity of the patient's impairments (multi-system involvement);For patient/therapist safety (completed with OT  as tech time not available this am) PT goals addressed during session: Mobility/safety with mobility;Balance OT goals addressed during session: ADL's and self-care;Strengthening/ROM      AM-PAC PT "6 Clicks" Mobility   Outcome Measure  Help needed turning from your back to your side while in a flat bed without using bedrails?: A Lot Help needed moving from lying on your back to sitting on the side of a flat bed without using bedrails?: A Lot Help needed moving to and from a bed to a chair (including a wheelchair)?: Total Help needed standing up from a chair using your arms (e.g., wheelchair or bedside chair)?: Total Help needed to walk in hospital room?: A Lot Help needed climbing 3-5 steps with a railing? : Total 6 Click Score: 9    End of Session Equipment Utilized During Treatment: Gait belt (wrist brace) Activity Tolerance: Patient tolerated treatment well Patient left: with chair alarm set;in chair;with call bell/phone within reach (R arm elevated) Nurse Communication: Mobility status PT Visit Diagnosis: Unsteadiness on feet (R26.81);Muscle weakness (generalized) (M62.81);History of falling (Z91.81);Difficulty in walking, not elsewhere classified (R26.2);Pain     Time: [id number] PT Time Calculation (min) (ACUTE ONLY): 31 min  Charges:  $Therapeutic Activity: 8-22 mins  Abran Richard, PT Acute Rehab Services Pager [phone] [facility] [phone]     Karlton Lemon 08/22/2020, 1:06 PM

## 2020-08-22 NOTE — Progress Notes (Incomplete)
  [facility]  [address] [place],  [place]  [address] [phone]  Clinic Day:  08/22/2020  Referring physician: [name], MD   HISTORY OF PRESENT ILLNESS:  The patient is a 75 y.o. female with anemia secondary to chronic renal insufficiency.  Of note, she has a transplanted kidney, but it is on the precipice of failing.  She has been receiving Retacrit to help bolster her hemoglobin.  She comes in to reassess her hemoglobin. Since her last visit,     PHYSICAL EXAM:  There were no vitals taken for this visit. Wt Readings from Last 3 Encounters:  08/21/20 127 lb 13.9 oz (58 kg)  08/08/20 125 lb 1.3 oz (56.7 kg)  05/26/20 125 lb 4 oz (56.8 kg)   There is no height or weight on file to calculate BMI. Performance status (ECOG): {CHL ONC [id number] Physical Exam  LABS:   CBC Latest Ref Rng & Units 08/22/2020 08/21/2020 08/20/2020  WBC 4.0 - 10.5 K/uL 4.7 4.1 3.3(L)  Hemoglobin 12.0 - 15.0 g/dL 9.3(L) 9.0(L) 8.5(L)  Hematocrit 36.0 - 46.0 % 29.9(L) 28.1(L) 26.2(L)  Platelets 150 - 400 K/uL 148(L) 140(L) 128(L)   CMP Latest Ref Rng & Units 08/22/2020 08/21/2020 08/20/2020  Glucose 70 - 99 mg/dL 261(H) 155(H) 189(H)  BUN 8 - 23 mg/dL 52(H) 50(H) 53(H)  Creatinine 0.44 - 1.00 mg/dL 3.01(H) 3.08(H) 3.17(H)  Sodium 135 - 145 mmol/L 132(L) 134(L) 133(L)  Potassium 3.5 - 5.1 mmol/L 4.3 3.7 3.7  Chloride 98 - 111 mmol/L 104 104 104  CO2 22 - 32 mmol/L 20(L) 24 22  Calcium 8.9 - 10.3 mg/dL 7.6(L) 7.9(L) 7.6(L)  Total Protein 6.5 - 8.1 g/dL - - -  Total Bilirubin 0.3 - 1.2 mg/dL - - -  Alkaline Phos 38 - 126 U/L - - -  AST 15 - 41 U/L - - -  ALT 0 - 44 U/L - - -     No results found for: CEA1 / No results found for: CEA1 No results found for: PSA1 No results found for: [id number] No results found for: [id number]  No results found for: TOTALPROTELP, ALBUMINELP, A1GS, A2GS, BETS, BETA2SER, GAMS, MSPIKE, SPEI Lab Results  Component Value Date    TIBC 254 06/22/2020   FERRITIN 122 06/22/2020   IRONPCTSAT 6.2 06/22/2020   No results found for: LDH  No results found for: AFPTUMOR, TOTALPROTELP, ALBUMINELP, A1GS, A2GS, BETS, BETA2SER, GAMS, MSPIKE, SPEI, LDH, CEA1, PSA1, IGASERUM, IGGSERUM, IGMSERUM, THGAB, THYROGLB  Recent Review Flowsheet Data    Oncology Labs 06/22/2020   FERRITIN 122   IRONPCTSAT 6.2      ASSESSMENT & PLAN:   Assessment/Plan:  A 75 y.o. female with anemia secondary to renal insufficiency  Her transplanted kidney is on the verge of failing .The patient understands all the plans discussed today and is in agreement with them.      Dequincy Macarthur Critchley, MD

## 2020-08-22 NOTE — Progress Notes (Addendum)
[facility] for Infectious Disease    Date of Admission:  08/16/2020   Total days of antibiotics 6           ID: Ruth Riley is a 75 y.o. female with history of polycystic kidney disease s/p renal transplant CKD 5 with failing kidney on IS admitted for fungemia Principal Problem:   Acute metabolic encephalopathy Active Problems:   Anemia in chronic kidney disease   Diabetes mellitus type 2 in nonobese (HCC)   Essential hypertension   H/O kidney transplant   Stage 4 chronic kidney disease (HCC)   Bilateral wrist pain   Multifocal pneumonia   History of COVID-19   Chronic back pain   Blood in the stool    Subjective: Afebrile, feeling better today,   ROS: joint pains  Medications:  . bisacodyl  10 mg Oral Q6H  . carvedilol  25 mg Oral BID  . Chlorhexidine Gluconate Cloth  6 each Topical Daily  . insulin aspart  0-9 Units Subcutaneous TID WC  . insulin glargine  10 Units Subcutaneous QHS  . lidocaine      . metoCLOPramide (REGLAN) injection  10 mg Intravenous Once   Followed by  . metoCLOPramide (REGLAN) injection  10 mg Intravenous Once  . pantoprazole  40 mg Oral BID  . peg 3350 powder  0.5 kit Oral Once   And  . peg 3350 powder  0.5 kit Oral Once  . predniSONE  10 mg Oral Q breakfast  . rOPINIRole  3 mg Oral QHS  . rosuvastatin  20 mg Oral Daily  . sertraline  100 mg Oral QHS  . sodium bicarbonate  1,300 mg Oral BID  . sulfamethoxazole-trimethoprim  1 tablet Oral Once per day on Mon Wed Fri  . tacrolimus  1 mg Oral TID    Objective: Vital signs in last 24 hours: Temp:  [97.9 F (36.6 C)-98.2 F (36.8 C)] 97.9 F (36.6 C) (04/11 1314) Pulse Rate:  [60-76] 69 (04/11 1439) Resp:  [16-18] 18 (04/11 1439) BP: (123-153)/(78-95) 125/78 (04/11 1439) SpO2:  [97 %-98 %] 97 % (04/11 1439) Physical Exam  Constitutional:  oriented to person, place, and time. appears chronically ill and well-nourished. No distress.  HENT: [place]/AT, PERRLA, no scleral  icterus Mouth/Throat: Oropharynx is dry. No oropharyngeal exudate. Dry mout Cardiovascular: Normal rate, regular rhythm and normal heart sounds. Exam reveals no gallop and no friction rub.  No murmur heard.  Pulmonary/Chest: Effort normal and breath sounds normal. No respiratory distress.  has no wheezes.  Neck = supple, no nuchal rigidity Abdominal: Soft. Bowel sounds are normal.  exhibits no distension. There is no tenderness.  Lymphadenopathy: no cervical adenopathy. No axillary adenopathy Neurological: alert and oriented to person, place, and time.  Skin: Skin is warm and dry. No rash noted. No erythema.  Psychiatric: a normal mood and affect.  behavior is normal.    Lab Results Recent Labs    08/21/20 0018 08/22/20 0045  WBC 4.1 4.7  HGB 9.0* 9.3*  HCT 28.1* 29.9*  NA 134* 132*  K 3.7 4.3  CL 104 104  CO2 24 20*  BUN 50* 52*  CREATININE 3.08* 3.01*   Liver Panel No results for input(s): PROT, ALBUMIN, AST, ALT, ALKPHOS, BILITOT, BILIDIR, IBILI in the last 72 hours. Sedimentation Rate No results for input(s): ESRSEDRATE in the last 72 hours. C-Reactive Protein No results for input(s): CRP in the last 72 hours.  Microbiology: Blood cx 4/5 = c.albicans Blood  cx 4/7 = NGTD Studies/Results: IR Fluoro Guide CV Line Right  Result Date: 08/22/2020 INDICATION: 75 year old female referred for tunneled cuffed central venous catheter EXAM: IMAGE GUIDED PLACEMENT OF TUNNELED CUFFED CENTRAL VENOUS CATHETER MEDICATIONS: None ANESTHESIA/SEDATION: None FLUOROSCOPY TIME:  Fluoroscopy Time: 0 minutes 6 seconds (0 mGy). COMPLICATIONS: None PROCEDURE: Informed written consent was obtained from the patient after a thorough discussion of the procedural risks, benefits and alternatives. All questions were addressed. Maximal Sterile Barrier Technique was utilized including caps, mask, sterile gowns, sterile gloves, sterile drape, hand hygiene and skin antiseptic. A timeout was performed prior to  the initiation of the procedure. After written informed consent was obtained, patient was placed in the supine position on angiographic table. Patency of the right internal jugular vein was confirmed with ultrasound with image documentation. Patient was prepped and draped in the usual sterile fashion including the right neck and right superior chest. Using ultrasound guidance, the skin and subcutaneous tissues overlying the right internal jugular vein were generously infiltrated with 1% lidocaine without epinephrine. Using ultrasound guidance, the right internal jugular vein was punctured with a micropuncture needle, and an 018 wire was advanced into the right heart confirming venous access. A small stab incision was made with an 11 blade scalpel. Peel-away sheath was placed over the wire, and then the wire was removed, marking the wire for estimation of internal catheter length. The chest wall was then generously infiltrated with 1% lidocaine for local anesthesia along the tissue tract. Small stab incision was made with 11 blade scalpel, and then the catheter was back tunneled to the puncture site at the right internal jugular vein. Catheter was pulled through the tract, with the catheter amputated at 18 cm. Catheter was advanced through the peel-away sheath, and the peel-away sheath was removed. Final image was stored. The catheter was anchored to the chest wall with 2 retention sutures, and Derma bond was used to seal the right internal jugular vein incision site and at the right chest wall. Patient tolerated the procedure well and remained hemodynamically stable throughout. No complications were encountered and no significant blood loss was encountered. IMPRESSION: Status post placement of right IJ tunneled cuffed central venous catheter. Signed, Dulcy Fanny. Dellia Nims, RPVI [facility] Radiology Electronically Signed   By: Corrie Mckusick D.O.   On: 08/22/2020 11:51    IR US Guide Vasc Access Right  INDICATION: 75 year old female referred for tunneled cuffed central venous catheter EXAM: IMAGE GUIDED PLACEMENT OF TUNNELED CUFFED CENTRAL VENOUS CATHETER MEDICATIONS: None ANESTHESIA/SEDATION: None FLUOROSCOPY TIME:  Fluoroscopy Time: 0 minutes 6 seconds (0 mGy). COMPLICATIONS: None PROCEDURE: Informed written consent was obtained from the patient after a thorough discussion of the procedural risks, benefits and alternatives. All questions were addressed. Maximal Sterile Barrier Technique was utilized including caps, mask, sterile gowns, sterile gloves, sterile drape, hand hygiene and skin antiseptic. A timeout was performed prior to the initiation of the procedure. After written informed consent was obtained, patient was placed in the supine position on angiographic table. Patency of the right internal jugular vein was confirmed with ultrasound with image documentation. Patient was prepped and draped in the usual sterile fashion including the right neck and right superior chest. Using ultrasound guidance, the skin and subcutaneous tissues overlying the right internal jugular vein were generously infiltrated with 1% lidocaine without epinephrine. Using ultrasound guidance, the right internal jugular vein was punctured with a micropuncture needle, and an 018 wire was advanced into the right heart confirming  venous access. A small stab incision was made with an 11 blade scalpel. Peel-away sheath was placed over the wire, and then the wire was removed, marking the wire for estimation of internal catheter length. The chest wall was then generously infiltrated with 1% lidocaine for local anesthesia along the tissue tract. Small stab incision was made with 11 blade scalpel, and then the catheter was back tunneled to the puncture site at the right internal jugular vein. Catheter was pulled through the tract, with the catheter amputated at 18 cm. Catheter was advanced through the  peel-away sheath, and the peel-away sheath was removed. Final image was stored. The catheter was anchored to the chest wall with 2 retention sutures, and Derma bond was used to seal the right internal jugular vein incision site and at the right chest wall. Patient tolerated the procedure well and remained hemodynamically stable throughout. No complications were encountered and no significant blood loss was encountered. IMPRESSION: Status post placement of right IJ tunneled cuffed central venous catheter. Signed, Dulcy Fanny. Dellia Nims, RPVI [facility] Radiology Electronically Signed   By: Corrie Mckusick D.O.   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 Awaiting egd and colonoscopy to see if can find source/presumed gi source from bleeding  - TTE no signs of emdocarditis, ophtho to evaluate today.  [facility] Cell: [phone] Pager: [phone]  08/22/2020, 3:02 PM

## 2020-08-22 NOTE — Progress Notes (Signed)
   Patient Status: [facility] - In-pt  Assessment and Plan: Patient in need of venous access.   Tunneled central catheter placement  ______________________________________________________________________   History of Present Illness: Ruth Riley is a 75 y.o. female   DM; HTN; Hx renal Tx and current stage 4 CKD Anemia; confusion  Confusion resolved  New candidemia +BC 4/5; BC neg 4/7 Will need IV meds as OP for 2 weeks per ID Plans to be DC to SNF soon  Scheduled for tunneled central catheter placement in IR   Allergies and medications reviewed.   Review of Systems: A 12 point ROS discussed and pertinent positives are indicated in the HPI above.  All other systems are negative.   Vital Signs: BP 125/80 (BP Location: Right Arm)   Pulse 70   Temp 98.2 F (36.8 C) (Oral)   Resp 18   Ht '5\' 1"'$  (1.549 m)   Wt 127 lb 13.9 oz (58 kg)   SpO2 98%   BMI 24.16 kg/m     Imaging reviewed.   Labs:  COAGS: No results for input(s): INR, APTT in the last 8760 hours.  BMP: Recent Labs    08/19/20 0158 08/20/20 0030 08/21/20 0018 08/22/20 0045  NA 135 133* 134* 132*  K 3.4* 3.7 3.7 4.3  CL 101 104 104 104  CO2 '23 22 24 '$ 20*  GLUCOSE 122* 189* 155* 261*  BUN 56* 53* 50* 52*  CALCIUM 8.0* 7.6* 7.9* 7.6*  CREATININE 3.44* 3.17* 3.08* 3.01*  GFRNONAA 14* 15* 15* 16*    Scheduled for tunneled central catheter placement in IR  She is aware of procedure benefits and risks including but not limited to Infection; bleeding; vessel damage Agreeable to proceed Consent signed in chart   Electronically Signed: Lavonia Drafts, PA-C 08/22/2020, 9:57 AM   I spent a total of 15 minutes in face to face in clinical consultation, greater than 50% of which was counseling/coordinating care for venous access.

## 2020-08-22 NOTE — Progress Notes (Signed)
PROGRESS NOTE                                                                             PROGRESS NOTE                                                                                                                                                                                                             Patient Demographics:    Ruth Riley, is a 75 y.o. female, DOB - 01-22-1946, OTL:[id number]  Outpatient Primary MD for the patient is [name], MD    LOS - 5  Admit date - 08/16/2020    No chief complaint on file.      Brief Narrative     Ruth Riley is a 75 y.o. female with medical history significant of DM; HTN; and h/o renal transplant with current stage 4 CKD (baseline 3.7) with anemia (baseline 9) presenting to [facility] with confusion.  When asked what brought her to the hospital, the patient repeatedly returns to her litany of orthopedic complaints.  Most recently, she did fall and injure her R wrist and then fell again and injured her L hand.  She acknowledges some SOB and cough that appear to have started in the last week; she was feeling better after her COVID infection in early February. -She was noted to have Hemoccult-positive stools with hemoglobin dropped from 9-7.5, given lack of GI coverage at [facility] she was transferred to [facility]. -She required 1 unit PRBC transfusion since admission, with no further recurrence of GI bleed, her main complaint was right wrist/hand pain, as well her blood cultures on admissions were significant for anemia for which she is being followed by ID.   Subjective:    Ruth Riley today is any chest pain, shortness of breath, still complaining of right wrist pain, but report it did improve .   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  Multifocal PNA with h/o COVID-19 infection -Patient presenting with productive cough, mildly decreased oxygen saturation. -[facility]hest [facility] confirming subtle bibasilar opacities  -She was encouraged with incentive spirometry, flutter valve . -Treated with IV azithromycin and Rocephin . -She was encouraged to use incentive spirometry and flutter valve .  Fungemia -Cardiomyopathy appreciated, currently on IV unresponding, followed by ID, thought to be related to GI bleed. -2D echo with EF of 70%, no shunt, no vegetation could be seen. -She denies any visual complaints, I have discussed with Dr. Stacie Glaze from ophthalmology, will come and evaluate her. -On Anidulafungin , she will need total of 14 days.  IJ tunneled CVC placed by IR/11.  Anemia, possible GI bleed -He required 1 unit PRBC transfusion on 4/6, hemoglobin has been stable since.  . -Plan for EGD/colonoscopy in a.m.    Stage 4 CKD, h/o renal transplant -Continue tacrolimus, initially on stress dose steroids, now back on prednisone. 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 Tears of the TFCC articular disc and foveal component of the triangular ligament. -Have discussed MRI finding with Dr. Lenon Curt, appears to be fluid collection related to inflammatory changes, no evidence of septic findings, recommendation is for this or colchicine which will be difficult here in the setting of her renal failure, so he does recommend steroids, give IV Solu-Medrol as needed, as well she will need to keep wrist splint for extended periods.  Back pain -continue home regmine    SpO2: 97 %  Recent Labs  Lab 08/16/20 1556 08/17/20 0109 08/18/20 0232 08/19/20 0158 08/20/20 0030 08/21/20 0018 08/22/20 0045  WBC 5.4   < > 4.9 3.0* 3.3* 4.1 4.7  PLT 134*   < > 132* 141* 128* 140* 148*  PROCALCITON 1.10  --   --   --   --   --   --   AST 15  --  18 16  --   --   --   ALT 16  --  15 16  --   --   --   ALKPHOS 70  --  67 62  --   --   --   BILITOT 1.0  --  0.5 0.4  --   --   --   ALBUMIN 2.6*  --  2.0* 1.9*  --   --   --    < > = values in this interval not displayed.       ABG  No  results found for: PHART, PCO2ART, PO2ART, HCO3, TCO2, ACIDBASEDEF, O2SAT     Condition - Extremely Guarded  Family Communication  : Discussed with husband by phone 4/10, 4/11  Code Status :  DNR  Consults  :  ID, IR, GI, opthalmology  Disposition Plan  :    Status is: Inpatient  Remains inpatient appropriate because:IV treatments appropriate due to intensity of illness or inability to take PO   Dispo: The patient is from: Home              Anticipated d/c is to: Home              Patient currently is not medically stable to d/c.   Difficult to place patient No      DVT Prophylaxis  : SCDs   Lab Results  Component Value Date   PLT 148 (L) 08/22/2020    Diet :  Diet Order            Diet clear liquid Room service  appropriate? Yes; Fluid consistency: Thin  Diet effective now                  Inpatient Medications  Scheduled Meds: . bisacodyl  10 mg Oral Q6H  . carvedilol  25 mg Oral BID  . Chlorhexidine Gluconate Cloth  6 each Topical Daily  . insulin aspart  0-9 Units Subcutaneous TID WC  . insulin glargine  10 Units Subcutaneous QHS  . lidocaine      . metoCLOPramide (REGLAN) injection  10 mg Intravenous Once   Followed by  . metoCLOPramide (REGLAN) injection  10 mg Intravenous Once  . pantoprazole  40 mg Oral BID  . peg 3350 powder  0.5 kit Oral Once   And  . peg 3350 powder  0.5 kit Oral Once  . predniSONE  10 mg Oral Q breakfast  . rOPINIRole  3 mg Oral QHS  . rosuvastatin  20 mg Oral Daily  . sertraline  100 mg Oral QHS  . sodium bicarbonate  1,300 mg Oral BID  . sulfamethoxazole-trimethoprim  1 tablet Oral Once per day on Mon Wed Fri  . tacrolimus  1 mg Oral TID   Continuous Infusions: . sodium chloride 50 mL/hr at 08/21/20 2017  . anidulafungin 100 mg (08/22/20 0940)   PRN Meds:.acetaminophen **OR** acetaminophen, albuterol, calcium carbonate (dosed in mg elemental calcium), camphor-menthol **AND** hydrOXYzine, docusate sodium, feeding supplement (NEPRO CARB STEADY), guaiFENesin, hydrALAZINE, HYDROcodone-acetaminophen, lidocaine, lip balm, ondansetron **OR** ondansetron (ZOFRAN) IV, oxyCODONE, polyethylene glycol, sorbitol, zolpidem  Antibiotics  :    Anti-infectives (From admission, onward)   Start     Dose/Rate Route Frequency Ordered Stop   08/20/20 1000  cefdinir (OMNICEF) capsule 300 mg  Status:  Discontinued        300 mg Oral Daily 08/19/20 0918 08/19/20 0936   08/20/20 1000  cefdinir (OMNICEF) capsule 300 mg        300 mg Oral Daily 08/19/20 0936 08/21/20 0922   08/19/20 1015  azithromycin (ZITHROMAX) tablet 500 mg  Status:  Discontinued        500 mg Oral Daily 08/19/20 0916 08/19/20 0936   08/19/20 1015  azithromycin (ZITHROMAX) tablet 500 mg        500 mg Oral  Daily 08/19/20 0936 08/21/20 0921   08/19/20 1000  anidulafungin (ERAXIS) 100 mg in sodium chloride 0.9 % 100 mL IVPB        100 mg 78 mL/hr over 100 Minutes Intravenous  Daily 08/18/20 0247 09/01/20 0959   08/18/20 0400  anidulafungin (ERAXIS) 200 mg in sodium chloride 0.9 % 200 mL IVPB        200 mg 78 mL/hr over 200 Minutes Intravenous  Once 08/18/20 0247 08/18/20 0704   08/17/20 0900  sulfamethoxazole-trimethoprim (BACTRIM) 400-80 MG per tablet 1 tablet        1 tablet Oral Once per day on Mon Wed Fri 08/16/20 1430     08/17/20 0800  cefTRIAXone (ROCEPHIN) 2 g in sodium chloride 0.9 % 100 mL IVPB  Status:  Discontinued        2 g 200 mL/hr over 30 Minutes Intravenous Every 24 hours 08/16/20 1430 08/19/20 0918   08/17/20 0800  azithromycin (ZITHROMAX) 500 mg in sodium chloride 0.9 % 250 mL IVPB  Status:  Discontinued        500 mg 250 mL/hr over 60 Minutes Intravenous Every 24 hours 08/16/20 1430 08/19/20 0916         Emeline Gins Traylon Schimming M.D on 08/22/2020 at 2:48 PM  To page go to [email or web address]   [facility] -  Office  [phone]     Objective:   Vitals:   08/21/20 2055 08/22/20 0535 08/22/20 1314 08/22/20 1439  BP: 123/79 125/80 (!) 153/95 125/78  Pulse: 76 70 60 69  Resp: 18 18 16 18   Temp: 98.1 F (36.7 C) 98.2 F (36.8 C) 97.9 F (36.6 C)   TempSrc: Oral Oral Oral   SpO2: 97% 98% 97% 97%  Weight:      Height:        Wt Readings from Last 3 Encounters:  08/21/20 58 kg  08/08/20 56.7 kg  05/26/20 56.8 kg    No intake or output data in the 24 hours ending 08/22/20 1448   Physical Exam  Awake Alert, easily distracted, no focal deficits symmetrical Chest wall movement, Good air movement bilaterally, CTAB RRR,No Gallops,Rubs or new Murmurs, No Parasternal Heave +ve B.Sounds, Abd Soft, No tenderness, No rebound - guarding or rigidity. 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     Data Review:    CBC Recent Labs  Lab 08/16/20 1556 08/17/20 0109 08/18/20 0232 08/19/20 0158 08/20/20 0030 08/21/20 0018 08/22/20 0045  WBC 5.4   < > 4.9 3.0* 3.3* 4.1 4.7  HGB 8.5*   < > 8.1* 8.4* 8.5* 9.0* 9.3*  HCT 26.5*   < > 24.2* 26.2* 26.2* 28.1* 29.9*  PLT 134*   < > 132* 141* 128* 140* 148*  MCV 93.3   < > 86.7 87.0 88.2 88.4 89.3  MCH 29.9   < > 29.0 27.9 28.6 28.3 27.8  MCHC 32.1   < > 33.5 32.1 32.4 32.0 31.1  RDW 16.5*   < > 19.7* 19.2* 18.7* 17.9* 17.7*  LYMPHSABS 0.5*  --   --   --   --   --   --   MONOABS 0.2  --   --   --   --   --   --   EOSABS 0.1  --   --   --   --   --   --   BASOSABS 0.0  --   --   --   --   --   --    < > = values in this interval not displayed.    Recent Labs  Lab 08/16/20 1556 08/17/20 0109 08/18/20 0232 08/19/20 0158 08/20/20  0030 08/21/20 0018 08/22/20 0045  NA 135   < > 132* 135 133* 134* 132*  K 4.8   < > 4.2 3.4* 3.7 3.7 4.3  CL 99   < > 101 101 104 104 104  CO2 27   < > 23 23 22 24  20*  GLUCOSE 104*   < > 229* 122* 189* 155* 261*  BUN 56*   < > 59* 56* 53* 50* 52*  CREATININE 3.77*   < > 3.48* 3.44* 3.17* 3.08* 3.01*  CALCIUM 8.9   < > 7.7* 8.0* 7.6* 7.9* 7.6*  AST 15  --  18 16  --   --   --   ALT 16  --  15 16  --   --   --   ALKPHOS 70  --  67 62  --   --   --   BILITOT 1.0  --  0.5 0.4  --   --   --   ALBUMIN 2.6*  --  2.0* 1.9*  --   --   --   PROCALCITON 1.10  --   --   --   --   --   --   TSH 2.903  --   --   --   --   --   --    < > = values in this interval not displayed.    ------------------------------------------------------------------------------------------------------------------ No results for input(s): CHOL, HDL, LDLCALC, TRIG, CHOLHDL, LDLDIRECT in the last 72 hours.  No results found for: HGBA1C ------------------------------------------------------------------------------------------------------------------ No results for input(s): TSH, T4TOTAL, T3FREE, THYROIDAB in the  last 72 hours.  Invalid input(s): FREET3  Cardiac Enzymes No results for input(s): CKMB, TROPONINI, MYOGLOBIN in the last 168 hours.  Invalid input(s): CK ------------------------------------------------------------------------------------------------------------------ No results found for: BNP  Micro Results Recent Results (from the past 240 hour(s))  Culture, blood (routine x 2) Call MD if unable to obtain prior to antibiotics being given     Status: Abnormal (Preliminary result)   Collection Time: 08/16/20  3:50 PM   Specimen: BLOOD  Result Value Ref Range Status   Specimen Description BLOOD RIGHT ANTECUBITAL  Final   Special Requests   Final    BOTTLES DRAWN AEROBIC AND ANAEROBIC Blood Culture results may not be optimal due to an inadequate volume of blood received in culture bottles   Culture  Setup Time (A)  Final    YEAST AEROBIC BOTTLE ONLY CRITICAL RESULT CALLED TO, READ BACK BY AND VERIFIED WITH: PHARMD ABBOTT [phone] FCP    Culture (A)  Final    CANDIDA ALBICANS Sent to [place] for further susceptibility testing. Performed at [facility], [place] [address], [place], [place] [address]    Report Status PENDING  Incomplete  Blood Culture ID Panel (Reflexed)     Status: Abnormal   Collection Time: 08/16/20  3:50 PM  Result Value Ref Range Status   Enterococcus faecalis NOT DETECTED NOT DETECTED Final   Enterococcus Faecium NOT DETECTED NOT DETECTED Final   Listeria monocytogenes NOT DETECTED NOT DETECTED Final   Staphylococcus species NOT DETECTED NOT DETECTED Final   Staphylococcus aureus (BCID) NOT DETECTED NOT DETECTED Final   Staphylococcus epidermidis NOT DETECTED NOT DETECTED Final   Staphylococcus lugdunensis NOT DETECTED NOT DETECTED Final   Streptococcus species NOT DETECTED NOT DETECTED Final   Streptococcus agalactiae NOT DETECTED NOT DETECTED Final   Streptococcus pneumoniae NOT DETECTED NOT DETECTED Final   Streptococcus pyogenes NOT DETECTED  NOT DETECTED  Final   A.calcoaceticus-baumannii NOT DETECTED NOT DETECTED Final   Bacteroides fragilis NOT DETECTED NOT DETECTED Final   Enterobacterales NOT DETECTED NOT DETECTED Final   Enterobacter cloacae complex NOT DETECTED NOT DETECTED Final   Escherichia coli NOT DETECTED NOT DETECTED Final   Klebsiella aerogenes NOT DETECTED NOT DETECTED Final   Klebsiella oxytoca NOT DETECTED NOT DETECTED Final   Klebsiella pneumoniae NOT DETECTED NOT DETECTED Final   Proteus species NOT DETECTED NOT DETECTED Final   Salmonella species NOT DETECTED NOT DETECTED Final   Serratia marcescens NOT DETECTED NOT DETECTED Final   Haemophilus influenzae NOT DETECTED NOT DETECTED Final   Neisseria meningitidis NOT DETECTED NOT DETECTED Final   Pseudomonas aeruginosa NOT DETECTED NOT DETECTED Final   Stenotrophomonas maltophilia NOT DETECTED NOT DETECTED Final   Candida albicans DETECTED (A) NOT DETECTED Final    Comment: CRITICAL RESULT CALLED TO, READ BACK BY AND VERIFIED WITH: PHARMD ABBOTT [phone] FCP    Candida auris NOT DETECTED NOT DETECTED Final   Candida glabrata NOT DETECTED NOT DETECTED Final   Candida krusei NOT DETECTED NOT DETECTED Final   Candida parapsilosis NOT DETECTED NOT DETECTED Final   Candida tropicalis NOT DETECTED NOT DETECTED Final   Cryptococcus neoformans/gattii NOT DETECTED NOT DETECTED Final    Comment: Performed at [facility], [address], [place], [place] [address]  Culture, blood (routine x 2) Call MD if unable to obtain prior to antibiotics being given     Status: Abnormal   Collection Time: 08/16/20  3:52 PM   Specimen: BLOOD LEFT ARM  Result Value Ref Range Status   Specimen Description BLOOD LEFT ARM  Final   Special Requests   Final    BOTTLES DRAWN AEROBIC AND ANAEROBIC Blood Culture results may not be optimal due to an inadequate volume of blood received in culture bottles   Culture  Setup Time (A)  Final    YEAST AEROBIC BOTTLE ONLY CRITICAL  VALUE NOTED.  VALUE IS CONSISTENT WITH PREVIOUSLY REPORTED AND CALLED VALUE. Performed at [facility], [place] [address] [place], [place] [address]    Culture CANDIDA ALBICANS (A)  Final   Report Status 08/21/2020 FINAL  Final  Culture, blood (routine x 2)     Status: None (Preliminary result)   Collection Time: 08/18/20  7:51 AM   Specimen: BLOOD  Result Value Ref Range Status   Specimen Description BLOOD RIGHT ANTECUBITAL  Final   Special Requests   Final    BOTTLES DRAWN AEROBIC AND ANAEROBIC Blood Culture adequate volume   Culture   Final    NO GROWTH 4 DAYS Performed at [facility], [place] [address], [place], [place] [address]    Report Status PENDING  Incomplete  Culture, blood (routine x 2)     Status: None (Preliminary result)   Collection Time: 08/18/20  8:03 AM   Specimen: BLOOD LEFT WRIST  Result Value Ref Range Status   Specimen Description BLOOD LEFT WRIST  Final   Special Requests   Final    BOTTLES DRAWN AEROBIC AND ANAEROBIC Blood Culture adequate volume   Culture   Final    NO GROWTH 4 DAYS Performed at [facility], [place] [address] [place], [place] [address]    Report Status PENDING  Incomplete    Radiology Reports MR HAND RIGHT WO CONTRAST  Result Date: 08/20/2020 CLINICAL DATA:  Right hand and wrist pain.  Recent falls. EXAM: MRI OF THE RIGHT  HAND WITHOUT CONTRAST; MRI OF THE RIGHT WRIST WITHOUT CONTRAST TECHNIQUE: Multiplanar, multisequence MR imaging of the right hand and wrist was performed. No intravenous contrast was administered. COMPARISON:  Right hand x-rays dated August 01, 2020. FINDINGS: Ligaments: Chronic complete tear of the scapholunate ligament with widening of the scapholunate interval. The lunotriquetral ligament is intact. Triangular fibrocartilage: There is a small partial tear of the articular disc ulnar surface. The foveal component of the triangular ligament is completely torn. Slight ulnar minus variance. Tendons: Flexor and  extensor tendons are intact. Large amount of fluid in the flexor tendon sheaths. Mild extensor carpi ulnaris tendinosis with small amount of fluid in the tendon sheath. Carpal tunnel/median nerve: Unremarkable. Guyon's canal: Unremarkable. Joint/cartilage: Scattered cartilage loss about the wrist with large areas of full-thickness cartilage loss in the radioscaphoid and scaphotrapeziotrapezoid joints. Scattered small carpal bone erosions. Additional small erosions of the second and third MCP joints with synovial thickening. Large radiocarpal and midcarpal joint effusions.Small distal radioulnar joint effusion. 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 Moderate first and third MCP joint osteoarthritis. Moderate to severe DIP joint osteoarthritis. Bones/carpal alignment: Chronic widening of the scapholunate interval. Early proximal migration of the capitate.No suspicious marrow signal abnormality. No acute fracture or dislocation. Other: Severe soft tissue swelling of the wrist and hand. No fluid collection. IMPRESSION: 1. No acute abnormality.  No fracture or evidence of osteomyelitis. 2. 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 3. Tears of the TFCC articular disc and foveal component of the triangular ligament. 4. Early SLAC wrist. 5. 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 6. Severe soft tissue swelling of the wrist and hand. No fluid collection. Electronically Signed   By: Titus Dubin M.D.   On: 08/20/2020 09:24   DG Hand 2 View Left  Result Date: 08/19/2020 CLINICAL DATA:  Pain EXAM: LEFT HAND - 2 VIEW COMPARISON:  None. FINDINGS: Frontal and lateral views obtained. There has been previous apparent removal of the  trapezium bone. Postoperative changes noted in the proximal second metacarpal and trapezoid bones. Minimal calcification is noted in the expected location of the trapezium bone. No acute fracture or dislocation. Bones are osteoporotic. There is marked narrowing of all D IP joints with spurring in all of these joints. There is moderate narrowing in the MCP and PIP joints. No erosion or periostitis evident. Bones are somewhat osteopenic IMPRESSION: 1. Apparent previous removal of the trapezium bone with a few tiny calcifications in this area. 2. 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 No erosions. 3.  No acute fracture or dislocation. 4.  Bones osteoporotic. Electronically Signed   By: Lowella Grip III M.D.   On: 08/19/2020 13:53   MR WRIST RIGHT WO CONTRAST  Result Date: 08/20/2020 CLINICAL DATA:  Right hand and wrist pain.  Recent falls. EXAM: MRI OF THE RIGHT HAND WITHOUT CONTRAST; MRI OF THE RIGHT WRIST WITHOUT CONTRAST TECHNIQUE: Multiplanar, multisequence MR imaging of the right hand and wrist was performed. No intravenous contrast was administered. COMPARISON:  Right hand x-rays dated August 01, 2020. FINDINGS: Ligaments: Chronic complete tear of the scapholunate ligament with widening of the scapholunate interval. The lunotriquetral ligament is intact. Triangular fibrocartilage: There is a small partial tear of the articular disc ulnar surface. The foveal component of  the triangular ligament is completely torn. Slight ulnar minus variance. Tendons: Flexor and extensor tendons are intact. Large amount of fluid in the flexor tendon sheaths. Mild extensor carpi ulnaris tendinosis with small amount of fluid in the tendon sheath. Carpal tunnel/median nerve: Unremarkable. Guyon's canal: Unremarkable. Joint/cartilage: Scattered cartilage loss about the wrist with large areas of full-thickness cartilage loss in the radioscaphoid and scaphotrapeziotrapezoid joints. Scattered small  carpal bone erosions. Additional small erosions of the second and third MCP joints with synovial thickening. Large radiocarpal and midcarpal joint effusions.Small distal radioulnar joint effusion. 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 Moderate first and third MCP joint osteoarthritis. Moderate to severe DIP joint osteoarthritis. Bones/carpal alignment: Chronic widening of the scapholunate interval. Early proximal migration of the capitate.No suspicious marrow signal abnormality. No acute fracture or dislocation. Other: Severe soft tissue swelling of the wrist and hand. No fluid collection. IMPRESSION: 1. No acute abnormality.  No fracture or evidence of osteomyelitis. 2. 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 3. Tears of the TFCC articular disc and foveal component of the triangular ligament. 4. Early SLAC wrist. 5. 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 6. Severe soft tissue swelling of the wrist and hand. No fluid collection. Electronically Signed   By: Titus Dubin M.D.   On: 08/20/2020 09:24   IR Fluoro Guide CV Line Right  Result Date: 08/22/2020 INDICATION: 75 year old female referred for tunneled cuffed central venous catheter EXAM: IMAGE GUIDED PLACEMENT OF TUNNELED CUFFED CENTRAL VENOUS CATHETER MEDICATIONS: None ANESTHESIA/SEDATION: None FLUOROSCOPY TIME:  Fluoroscopy Time: 0 minutes 6 seconds (0 mGy). COMPLICATIONS: None PROCEDURE: Informed written consent was obtained from the patient after a thorough discussion of the procedural risks, benefits and alternatives. All questions were addressed. Maximal Sterile Barrier Technique was utilized including caps, mask, sterile gowns, sterile gloves, sterile drape,  hand hygiene and skin antiseptic. A timeout was performed prior to the initiation of the procedure. After written informed consent was obtained, patient was placed in the supine position on angiographic table. Patency of the right internal jugular vein was confirmed with ultrasound with image documentation. Patient was prepped and draped in the usual sterile fashion including the right neck and right superior chest. Using ultrasound guidance, the skin and subcutaneous tissues overlying the right internal jugular vein were generously infiltrated with 1% lidocaine without epinephrine. Using ultrasound guidance, the right internal jugular vein was punctured with a micropuncture needle, and an 018 wire was advanced into the right heart confirming venous access. A small stab incision was made with an 11 blade scalpel. Peel-away sheath was placed over the wire, and then the wire was removed, marking the wire for estimation of internal catheter length. The chest wall was then generously infiltrated with 1% lidocaine for local anesthesia along the tissue tract. Small stab incision was made with 11 blade scalpel, and then the catheter was back tunneled to the puncture site at the right internal jugular vein. Catheter was pulled through the tract, with the catheter amputated at 18 cm. Catheter was advanced through the peel-away sheath, and the peel-away sheath was removed. Final image was stored. The catheter was anchored to the chest wall with 2 retention sutures, and Derma bond was used to seal the right internal jugular vein incision site and at the right chest wall.  Patient tolerated the procedure well and remained hemodynamically stable throughout. No complications were encountered and no significant blood loss was encountered. IMPRESSION: Status post placement of right IJ tunneled cuffed central venous catheter. Signed, Dulcy Fanny. Dellia Nims, RPVI [facility] Rad[facility]  Electronically Signed   By: Corrie Mckusick D.O.   On: 08/22/2020 11:51   IR US Guide Vasc Access Right  INDICATION: 75 year old female referred for tunneled cuffed central venous catheter EXAM: IMAGE GUIDED PLACEMENT OF TUNNELED CUFFED CENTRAL VENOUS CATHETER MEDICATIONS: None ANESTHESIA/SEDATION: None FLUOROSCOPY TIME:  Fluoroscopy Time: 0 minutes 6 seconds (0 mGy). COMPLICATIONS: None PROCEDURE: Informed written consent was obtained from the patient after a thorough discussion of the procedural risks, benefits and alternatives. All questions were addressed. Maximal Sterile Barrier Technique was utilized including caps, mask, sterile gowns, sterile gloves, sterile drape, hand hygiene and skin antiseptic. A timeout was performed prior to the initiation of the procedure. After written informed consent was obtained, patient was placed in the supine position on angiographic table. Patency of the right internal jugular vein was confirmed with ultrasound with image documentation. Patient was prepped and draped in the usual sterile fashion including the right neck and right superior chest. Using ultrasound guidance, the skin and subcutaneous tissues overlying the right internal jugular vein were generously infiltrated with 1% lidocaine without epinephrine. Using ultrasound guidance, the right internal jugular vein was punctured with a micropuncture needle, and an 018 wire was advanced into the right heart confirming venous access. A small stab incision was made with an 11 blade scalpel. Peel-away sheath was placed over the wire, and then the wire was removed, marking the wire for estimation of internal catheter length. The chest wall was then generously infiltrated with 1% lidocaine for local anesthesia along the tissue tract. Small stab incision was made with 11 blade scalpel, and then the catheter was back tunneled to the puncture site at the right internal jugular vein. Catheter was pulled through the tract, with  the catheter amputated at 18 cm. Catheter was advanced through the peel-away sheath, and the peel-away sheath was removed. Final image was stored. The catheter was anchored to the chest wall with 2 retention sutures, and Derma bond was used to seal the right internal jugular vein incision site and at the right chest wall. Patient tolerated the procedure well and remained hemodynamically stable throughout. No complications were encountered and no significant blood loss was encountered. IMPRESSION: Status post placement of right IJ tunneled cuffed central venous catheter. Signed, Dulcy Fanny. Dellia Nims, RPVI [facility] Interventional Radiology Specialists [facility] Radiology Electronically Signed   By: Corrie Mckusick D.O.   On: 08/22/2020 11:51   DG Chest Port 1 View  Result Date: 08/17/2020 CLINICAL DATA:  Dyspnea. EXAM: PORTABLE CHEST 1 VIEW COMPARISON:  August 15, 2020. FINDINGS: Stable cardiomediastinal silhouette. No pneumothorax or pleural effusion is noted. No consolidative process is noted. Bony thorax is unremarkable. IMPRESSION: No active disease. Electronically Signed   By: Marijo Conception M.D.   On: 08/17/2020 08:46   ECHOCARDIOGRAM COMPLETE  Result Date: 08/19/2020    ECHOCARDIOGRAM REPORT   Patient Name:   JESSILYNN TAFT [facility] Date of Exam: 08/19/2020 Medical Rec #:  [id number]              Height:       61.0 in Accession #:    [id number]             Weight:  126.9 lb Date of Birth:  1945/08/01              BSA:          1.557 m Patient Age:    50 years               BP:           135/85 mmHg Patient Gender: F                      HR:           74 bpm. Exam Location:  Inpatient Procedure: 2D Echo, Cardiac Doppler and Color Doppler Indications:     Candidemia  History:         Patient has no prior history of Echocardiogram examinations.                  Risk Factors:Hypertension and Diabetes.  Sonographer:     Radford, Loyal, RVT, RDCS Referring Phys:  [id number] [facility]  Diagnosing Phys: Oswaldo Milian MD  Sonographer Comments: Restricted mobility; unable to turn patient due to shoulder injury. IMPRESSIONS  1. Left ventricular ejection fraction, by estimation, is 70 to 75%. The left ventricle has hyperdynamic function. The left ventricle has no regional wall motion abnormalities. Left ventricular diastolic parameters are consistent with Grade II diastolic dysfunction (pseudonormalization). Elevated left atrial pressure. Hyperdynamic LV function with mid cavitary/LVOT gradient up to 23 mmHg  2. Right ventricular systolic function is normal. The right ventricular size is normal. There is mildly elevated pulmonary artery systolic pressure. The estimated right ventricular systolic pressure is 45.4 mmHg.  3. Right atrial size was mildly dilated.  4. The mitral valve is normal in structure. No evidence of mitral valve regurgitation.  5. The aortic valve was not well visualized. Aortic valve regurgitation is trivial. No aortic stenosis is present.  6. The inferior vena cava is normal in size with greater than 50% respiratory variability, suggesting right atrial pressure of 3 mmHg. Conclusion(s)/Recommendation(s): No vegetation seen, but technically difficult study. If high clinical suspicion for endocarditis, recommend TEE. FINDINGS  Left Ventricle: Left ventricular ejection fraction, by estimation, is 70 to 75%. The left ventricle has hyperdynamic function. The left ventricle has no regional wall motion abnormalities. The left ventricular internal cavity size was small. There is no  left ventricular hypertrophy. Left ventricular diastolic parameters are consistent with Grade II diastolic dysfunction (pseudonormalization). Elevated left atrial pressure. Right Ventricle: The right ventricular size is normal. No increase in right ventricular wall thickness. Right ventricular systolic function is normal. There is mildly elevated pulmonary artery systolic pressure. The tricuspid  regurgitant velocity is 2.88  m/s, and with an assumed right atrial pressure of 3 mmHg, the estimated right ventricular systolic pressure is 09.8 mmHg. Left Atrium: Left atrial size was normal in size. Right Atrium: Right atrial size was mildly dilated. Pericardium: There is no evidence of pericardial effusion. Mitral Valve: The mitral valve is normal in structure. No evidence of mitral valve regurgitation. MV peak gradient, 17.8 mmHg. The mean mitral valve gradient is 5.0 mmHg. Tricuspid Valve: The tricuspid valve is normal in structure. Tricuspid valve regurgitation is trivial. Aortic Valve: The aortic valve was not well visualized. Aortic valve regurgitation is trivial. No aortic stenosis is present. Pulmonic Valve: The pulmonic valve was not well visualized. Pulmonic valve regurgitation is not visualized. Aorta: The aortic root is normal in size and structure. Venous: The inferior vena cava is normal in  size with greater than 50% respiratory variability, suggesting right atrial pressure of 3 mmHg. IAS/Shunts: The interatrial septum was not well visualized.  LEFT VENTRICLE PLAX 2D LVIDd:         3.20 cm  Diastology LVIDs:         2.20 cm  LV e' medial:    3.70 cm/s LV PW:         0.90 cm  LV E/e' medial:  26.3 LV IVS:        0.70 cm  LV e' lateral:   4.03 cm/s LVOT diam:     1.30 cm  LV E/e' lateral: 24.2 LV SV:         44 LV SV Index:   28 LVOT Area:     1.33 cm  RIGHT VENTRICLE RV S prime:     13.30 cm/s TAPSE (M-mode): 1.6 cm LEFT ATRIUM             Index       RIGHT ATRIUM           Index LA diam:        3.50 cm 2.25 cm/m  RA Area:     16.70 cm LA Vol (A2C):   29.5 ml 18.95 ml/m RA Volume:   43.60 ml  28.01 ml/m LA Vol (A4C):   39.1 ml 25.12 ml/m LA Biplane Vol: 33.4 ml 21.46 ml/m  AORTIC VALVE LVOT Vmax:   194.00 cm/s LVOT Vmean:  146.000 cm/s LVOT VTI:    0.333 m  AORTA Ao Root diam: 2.70 cm MITRAL VALVE                TRICUSPID VALVE MV Area (PHT): 2.21 cm     TR Peak grad:   33.2 mmHg MV Area VTI:    0.71 cm     TR Vmax:        288.00 cm/s MV Peak grad:  17.8 mmHg MV Mean grad:  5.0 mmHg     SHUNTS MV Vmax:       2.11 m/s     Systemic VTI:  0.33 m MV Vmean:      105.0 cm/s   Systemic Diam: 1.30 cm MV Decel Time: 343 msec MV E velocity: 97.40 cm/s MV A velocity: 178.00 cm/s MV E/A ratio:  0.55 Oswaldo Milian MD Electronically signed by Oswaldo Milian MD Signature Date/Time: 08/19/2020/9:03:51 PM    Final (Updated)

## 2020-08-22 NOTE — Procedures (Signed)
Interventional Radiology Procedure Note  Procedure: Placement of a right IJ approach double lumen cuffed tunneled CVC.   Tip is positioned at the superior cavoatrial junction and catheter is ready for immediate use.  Complications: None Recommendations:  - Ok to use - Do not submerge - Routine line care   Signed,  Dulcy Fanny. Earleen Newport, DO

## 2020-08-22 NOTE — Progress Notes (Signed)
Occupational Therapy Treatment Patient Details Name: Ruth Riley MRN: [id number] DOB: 01-Jul-1945 Today's Date: 08/22/2020    History of present illness Pt is 75 y.o. female who presented to [facility] with confusion, SoB, and cough and was transferred to [facility] and admitted 08/16/20. Pt being treated for anemia, bilateral PNA and possible GI bleed.  Pt thought to have fx L 4th finger and was splinted - later found to not be broken and splint off.  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  PMH: DM; HTN; and h/o renal transplant with current stage 4 CKD (baseline 3.7) with anemia (baseline 9), COVID infection in early February. hx of multiple falls and orthopedic complaints most recently L ring finger fx.   OT comments  Pt in bed upon arrival, agreeable to OOB activity with OT and PT. Pt reports decreased pain in R wrist and was able to use it to complete bed mobility to sit EOB and to stabilize on RW for transfers. Pt does not have any weight bearing restrictions per chart or her recall. Pt transferred to [facility] mod A +2 with increased time. Pt washed face and LB seated on BSC. Pt ambulated x 2-3 ft with RW from [facility] to recliner. Pt instructed on R hand ROM exercises and elevation to decreased edema. Pt's R hand edema decreased throughout session and R UE positioned in elevation with 2 pillows while seated in recliner. OT will continue to follow acutely to maximize level of function and safety  Follow Up Recommendations  SNF    Equipment Recommendations  3 in 1 bedside commode    Recommendations for Other Services      Precautions / Restrictions Precautions Precautions: Fall Precaution Comments:  multple falls, increased pain medication Required Braces or Orthoses: Other Brace Splint/Cast: R wrist brace at all times; elevated R UE Restrictions Weight Bearing Restrictions: No Other Position/Activity Restrictions: No weightbearing restrictions ordered - pt unaware of any restrictions and reports R wrist injury present prior to admission       Mobility Bed Mobility Overal bed mobility: Needs Assistance Bed Mobility: Supine to Sit     Supine to sit: Mod assist;+2 for safety/equipment     General bed mobility comments: roll to L with min A to maintain position and mod A to lift trunk    Transfers Overall transfer level: Needs assistance Equipment used: Rolling walker (2 wheeled);2 person hand held assist Transfers: Sit to/from [facility] Sit to Stand: Mod assist;+2 safety/equipment;+2 physical assistance Stand pivot transfers: Mod assist;+2 safety/equipment;+2 physical assistance       General transfer comment: Cues for hand placement and multiple attempt to rise; mod A of 2 to rise; performed sit to stand with pivot to [facility] with HHA of 2; performed sit to stand to move to recliner with RW with cues and assist    Balance Overall balance assessment: Needs assistance Sitting-balance support: Feet supported;No upper extremity supported Sitting balance-Leahy Scale: Fair Sitting balance - Comments: Able to maintain balance sitting at edge of chair; Cues to shift side to side to slide back in chair - mod A to complete slide back   Standing balance support: Bilateral upper extremity supported;During functional activity Standing balance-Leahy Scale: Poor Standing balance comment: pt requires  outside support to steady                           ADL either performed or assessed with clinical judgement   ADL Overall ADL's : Needs assistance/impaired     Grooming: Wash/dry hands;Wash/dry face;Set up;Supervision/safety;Sitting   Upper Body Bathing:  Moderate assistance;Sitting   Lower Body Bathing: Moderate assistance;Sitting/lateral leans Lower Body Bathing Details (indicate cue type and reason): seated on BSC Upper Body Dressing : Moderate assistance;Sitting       Toilet Transfer: Moderate assistance;Stand-pivot;+2 for physical assistance;Cueing for safety;BSC   Toileting- Clothing Manipulation and Hygiene: Moderate assistance;Sitting/lateral lean       Functional mobility during ADLs: Moderate assistance;+2 for physical assistance;Cueing for safety;Cueing for sequencing;Rolling walker       Vision Baseline Vision/History: No visual deficits Patient Visual Report: No change from baseline     Perception     Praxis      Cognition Arousal/Alertness: Awake/alert Behavior During Therapy: WFL for tasks assessed/performed Overall Cognitive Status: Impaired/Different from baseline Area of Impairment: Following commands;Problem solving;Memory                     Memory: Decreased short-term memory;Decreased recall of precautions Following Commands: Follows one step commands with increased time;Follows multi-step commands inconsistently;Follows multi-step commands with increased time     Problem Solving: Requires verbal cues;Requires tactile cues          Exercises General Exercises - Lower Extremity Ankle Circles/Pumps: AROM;Both;20 reps;Seated Long Arc Quad: AROM;Both;10 reps;Seated Heel Slides: AAROM;Both;10 reps;Supine Hip ABduction/ADduction: AAROM;Both;10 reps;Supine Other Exercises Other Exercises: pt instructed on R hand ROM execises, edema mgt with R UE positioned in elevation with with 2 pillows Other Exercises: Cues for all exercises  for ROM as able and to limit compensation techniques   Shoulder Instructions       General Comments      Pertinent Vitals/ Pain       Pain Assessment: Faces Faces Pain Scale: Hurts little more Pain Location: low back; R hand/wrist Pain Descriptors /  Indicators: Guarding;Discomfort Pain Intervention(s): Limited activity within patient's tolerance;Monitored during session;Patient requesting pain meds-RN notified;Repositioned  Home Living                                          Prior Functioning/Environment              Frequency  Min 2X/week        Progress Toward Goals  OT Goals(current goals can now be found in the care plan section)  Progress towards OT goals: Progressing toward goals  Acute Rehab OT Goals Patient Stated Goal: feel better  Plan Discharge plan remains appropriate    Co-evaluation    PT/OT/SLP Co-Evaluation/Treatment: Yes Reason for Co-Treatment: Complexity of the patient's impairments (multi-system involvement);For patient/therapist safety;To address functional/ADL transfers PT goals addressed during session: Mobility/safety with mobility;Balance OT goals addressed during session: ADL's and self-care;Proper use of Adaptive equipment and DME      AM-PAC OT "6 Clicks" Daily Activity     Outcome Measure   Help from another person eating meals?: A Little Help from another person taking care of personal grooming?: A Little Help from another person toileting, which includes using toliet, bedpan, or urinal?: A Lot Help from another person bathing (including washing, rinsing, drying)?: A Lot Help from another person  to put on and taking off regular upper body clothing?: A Lot Help from another person to put on and taking off regular lower body clothing?: A Lot 6 Click Score: 14    End of Session Equipment Utilized During Treatment: Gait belt;Rolling walker;Other (comment) (BSC, R hand splint)  OT Visit Diagnosis: Unsteadiness on feet (R26.81);Muscle weakness (generalized) (M62.81);Pain;Other symptoms and signs involving cognitive function Pain - Right/Left: Right Pain - part of body: Hand   Activity Tolerance Patient tolerated treatment well   Patient Left in chair;with call  bell/phone within reach;with chair alarm set   Nurse Communication          Time: 1203-1229 OT Time Calculation (min): 26 min  Charges: OT General Charges $OT Visit: 1 Visit OT Treatments $Self Care/Home Management : 8-22 mins     Britt Bottom 08/22/2020, 2:50 PM

## 2020-08-22 NOTE — TOC Progression Note (Signed)
Transition of Care [facility]) - Progression Note    Patient Details  Name: Jazlen Leman MRN: [id number] Date of Birth: 11/13/1945  Transition of Care [facility]) CM/SW [place], LCSW Phone Number: 08/22/2020, 2:30 PM  Clinical Narrative:    Lanell Matar submitting updated notes to [facility] for SNF approval.    Expected Discharge Plan: [place] Barriers to Discharge: Continued Medical Work up  Expected Discharge Plan and Services Expected Discharge Plan: [place] In-house Referral: Clinical Social Work Discharge Planning Services: CM Consult Post Acute Care Choice: [place] Living arrangements for the past 2 months: Apartment                                       Social Determinants of Health (SDOH) Interventions    Readmission Risk Interventions No flowsheet data found.

## 2020-08-23 ENCOUNTER — Inpatient Hospital Stay: Payer: Medicare Other

## 2020-08-23 ENCOUNTER — Inpatient Hospital Stay (HOSPITAL_COMMUNITY): Payer: Medicare Other | Admitting: Certified Registered Nurse Anesthetist

## 2020-08-23 ENCOUNTER — Encounter (HOSPITAL_COMMUNITY): Payer: Self-pay | Admitting: Internal Medicine

## 2020-08-23 ENCOUNTER — Inpatient Hospital Stay: Payer: Medicare Other | Admitting: Oncology

## 2020-08-23 ENCOUNTER — Encounter (HOSPITAL_COMMUNITY)
Admission: AD | Disposition: A | Discharge status: Skilled Nursing Facility | Payer: Self-pay | Source: Other Acute Inpatient Hospital | Attending: Internal Medicine

## 2020-08-23 DIAGNOSIS — K529 Noninfective gastroenteritis and colitis, unspecified: Secondary | ICD-10-CM

## 2020-08-23 DIAGNOSIS — D649 Anemia, unspecified: Secondary | ICD-10-CM

## 2020-08-23 DIAGNOSIS — G9341 Metabolic encephalopathy: Secondary | ICD-10-CM | POA: Diagnosis not present

## 2020-08-23 DIAGNOSIS — D8481 Immunodeficiency due to conditions classified elsewhere: Secondary | ICD-10-CM | POA: Diagnosis not present

## 2020-08-23 DIAGNOSIS — B3789 Other sites of candidiasis: Secondary | ICD-10-CM | POA: Diagnosis not present

## 2020-08-23 DIAGNOSIS — K31811 Angiodysplasia of stomach and duodenum with bleeding: Secondary | ICD-10-CM

## 2020-08-23 DIAGNOSIS — Z940 Kidney transplant status: Secondary | ICD-10-CM | POA: Diagnosis not present

## 2020-08-23 HISTORY — PX: HOT HEMOSTASIS: SHX5433

## 2020-08-23 HISTORY — PX: BIOPSY: SHX5522

## 2020-08-23 HISTORY — PX: COLONOSCOPY: SHX5424

## 2020-08-23 HISTORY — PX: HEMOSTASIS CLIP PLACEMENT: SHX6857

## 2020-08-23 HISTORY — PX: ESOPHAGOGASTRODUODENOSCOPY (EGD) WITH PROPOFOL: SHX5813

## 2020-08-23 LAB — CULTURE, BLOOD (ROUTINE X 2)
Culture: NO GROWTH
Culture: NO GROWTH
Special Requests: ADEQUATE
Special Requests: ADEQUATE

## 2020-08-23 LAB — GLUCOSE, CAPILLARY
Glucose-Capillary: 70 mg/dL (ref 70–99)
Glucose-Capillary: 83 mg/dL (ref 70–99)
Glucose-Capillary: 94 mg/dL (ref 70–99)
Glucose-Capillary: 104 mg/dL — ABNORMAL HIGH (ref 70–99)
Glucose-Capillary: 86 mg/dL (ref 70–99)
Glucose-Capillary: 230 mg/dL — ABNORMAL HIGH (ref 70–99)

## 2020-08-23 SURGERY — ESOPHAGOGASTRODUODENOSCOPY (EGD) WITH PROPOFOL
Anesthesia: Monitor Anesthesia Care

## 2020-08-23 MED ORDER — PROPOFOL 10 MG/ML IV BOLUS
INTRAVENOUS | Status: DC | PRN
  Administered 2020-08-23 (×2): 10 mg via INTRAVENOUS

## 2020-08-23 MED ORDER — LABETALOL HCL 5 MG/ML IV SOLN
INTRAVENOUS | Status: AC
  Filled 2020-08-23: qty 4

## 2020-08-23 MED ORDER — PROPOFOL 500 MG/50ML IV EMUL
INTRAVENOUS | Status: DC | PRN
  Administered 2020-08-23: 100 ug/kg/min via INTRAVENOUS

## 2020-08-23 MED ORDER — HYDRALAZINE HCL 20 MG/ML IJ SOLN
5.0000 mg | Freq: Once | INTRAMUSCULAR | Status: AC
  Administered 2020-08-23: 5 mg via INTRAVENOUS

## 2020-08-23 MED ORDER — LABETALOL HCL 5 MG/ML IV SOLN
10.0000 mg | Freq: Once | INTRAVENOUS | Status: AC
  Administered 2020-08-23: 10 mg via INTRAVENOUS

## 2020-08-23 MED ORDER — LIDOCAINE 2% (20 MG/ML) 5 ML SYRINGE
INTRAMUSCULAR | Status: DC | PRN
  Administered 2020-08-23: 40 mg via INTRAVENOUS

## 2020-08-23 MED ORDER — LABETALOL HCL 5 MG/ML IV SOLN
5.0000 mg | Freq: Once | INTRAVENOUS | Status: AC
  Administered 2020-08-23: 5 mg via INTRAVENOUS

## 2020-08-23 MED ORDER — HYDRALAZINE HCL 20 MG/ML IJ SOLN
INTRAMUSCULAR | Status: AC
  Filled 2020-08-23: qty 1

## 2020-08-23 MED ORDER — LABETALOL HCL 5 MG/ML IV SOLN
20.0000 mg | Freq: Once | INTRAVENOUS | Status: AC
  Administered 2020-08-23: 20 mg via INTRAVENOUS

## 2020-08-23 MED ORDER — SODIUM CHLORIDE 0.9 % IV SOLN: INTRAVENOUS | Status: DC | PRN

## 2020-08-23 SURGICAL SUPPLY — 15 items

## 2020-08-23 NOTE — Progress Notes (Signed)
[facility] for Infectious Disease    Date of Admission:  08/16/2020   Total days of antibiotics 7           ID: Ruth Riley is a 75 y.o. female with immunocompromised host due to hx of kidney transplant found to have candidemia in setting of gi bleed Principal Problem:   Acute metabolic encephalopathy Active Problems:   Anemia in chronic kidney disease   Diabetes mellitus type 2 in nonobese [facility])   Essential hypertension   H/O kidney transplant   Stage 4 chronic kidney disease (HCC)   Bilateral wrist pain   Multifocal pneumonia   History of COVID-19   Chronic back pain   Blood in the stool   Heme positive stool   Acute on chronic anemia   Inflammation of colonic mucosa   Angiodysplasia of stomach with hemorrhage    Subjective: Underwent EGD and CSY this morning, still groggy from sedation Denies fever, abdominal pain.  Underwent ophtho evaluation last night - ruled out endophalmitis  EGD findings  - 2 angioectasias, 1 oozing in proximal gastric body.treated with argon plasma CSY findings - moderate mucosla hcnages with edema, and friability in proximal ascending colon and cecum. Had single biopsy that required hemostatic clip. Etiology of inflammation not immedicately cleared to whether ischemic colitis vs other ddx such asc IBD   Medications:  . carvedilol  25 mg Oral BID  . Chlorhexidine Gluconate Cloth  6 each Topical Daily  . insulin aspart  0-9 Units Subcutaneous TID WC  . insulin glargine  10 Units Subcutaneous QHS  . pantoprazole  40 mg Oral BID  . peg 3350 powder  0.5 kit Oral Once  . predniSONE  10 mg Oral Q breakfast  . rOPINIRole  3 mg Oral QHS  . rosuvastatin  20 mg Oral Daily  . sertraline  100 mg Oral QHS  . sodium bicarbonate  1,300 mg Oral BID  . sulfamethoxazole-trimethoprim  1 tablet Oral Once per day on Mon Wed Fri  . tacrolimus  1 mg Oral TID    Objective: Vital signs in last 24 hours: Temp:  [97.5 F (36.4 C)-97.8 F (36.6  C)] 97.5 F (36.4 C) (04/12 1115) Pulse Rate:  [55-89] 82 (04/12 1216) Resp:  [16-21] 16 (04/12 1216) BP: (125-227)/(78-118) 184/104 (04/12 1216) SpO2:  [94 %-98 %] 95 % (04/12 1216)  Physical Exam  Constitutional:  oriented to person, place, and time. appears frail and well-nourished. No distress.  HENT: [place]/AT, PERRLA, no scleral icterus Mouth/Throat: Oropharynx is clear and moist. No oropharyngeal exudate.  Cardiovascular: Normal rate, regular rhythm and normal heart sounds. Exam reveals no gallop and no friction rub.  No murmur heard.  Pulmonary/Chest: Effort normal and breath sounds normal. No respiratory distress.  has no wheezes.  Ext: right arm elevated/wrapped Abdominal: Soft. Bowel sounds are normal.  exhibits no distension. There is no tenderness.  Lymphadenopathy: no cervical adenopathy. No axillary adenopathy Neurological: alert and oriented to person, place, and time.  Skin: Skin is warm and dry. No rash noted. No erythema.  Psychiatric: a normal mood and affect.  behavior is normal.    Lab Results Recent Labs    08/21/20 0018 08/22/20 0045  WBC 4.1 4.7  HGB 9.0* 9.3*  HCT 28.1* 29.9*  NA 134* 132*  K 3.7 4.3  CL 104 104  CO2 24 20*  BUN 50* 52*  CREATININE 3.08* 3.01*    Microbiology: 4/5 blood cx c.albicans 4/7 blood cx NGTD  Studies/Results: IR Fluoro Guide CV Line Right  Result Date: 08/22/2020 INDICATION: 74 year old female referred for tunneled cuffed central venous catheter EXAM: IMAGE GUIDED PLACEMENT OF TUNNELED CUFFED CENTRAL VENOUS CATHETER MEDICATIONS: None ANESTHESIA/SEDATION: None FLUOROSCOPY TIME:  Fluoroscopy Time: 0 minutes 6 seconds (0 mGy). COMPLICATIONS: None PROCEDURE: Informed written consent was obtained from the patient after a thorough discussion of the procedural risks, benefits and alternatives. All questions were addressed. Maximal Sterile Barrier Technique was utilized including caps, mask, sterile gowns, sterile gloves, sterile  drape, hand hygiene and skin antiseptic. A timeout was performed prior to the initiation of the procedure. After written informed consent was obtained, patient was placed in the supine position on angiographic table. Patency of the right internal jugular vein was confirmed with ultrasound with image documentation. Patient was prepped and draped in the usual sterile fashion including the right neck and right superior chest. Using ultrasound guidance, the skin and subcutaneous tissues overlying the right internal jugular vein were generously infiltrated with 1% lidocaine without epinephrine. Using ultrasound guidance, the right internal jugular vein was punctured with a micropuncture needle, and an 018 wire was advanced into the right heart confirming venous access. A small stab incision was made with an 11 blade scalpel. Peel-away sheath was placed over the wire, and then the wire was removed, marking the wire for estimation of internal catheter length. The chest wall was then generously infiltrated with 1% lidocaine for local anesthesia along the tissue tract. Small stab incision was made with 11 blade scalpel, and then the catheter was back tunneled to the puncture site at the right internal jugular vein. Catheter was pulled through the tract, with the catheter amputated at 18 cm. Catheter was advanced through the peel-away sheath, and the peel-away sheath was removed. Final image was stored. The catheter was anchored to the chest wall with 2 retention sutures, and Derma bond was used to seal the right internal jugular vein incision site and at the right chest wall. Patient tolerated the procedure well and remained hemodynamically stable throughout. No complications were encountered and no significant blood loss was encountered. IMPRESSION: Status post placement of right IJ tunneled cuffed central venous catheter. Signed, Dulcy Fanny. Dellia Nims, RPVI [facility] Inter[facility] Radiology Special[facility]  Radiology Electronically Signed   By: Corrie Mckusick D.O.   On: 08/22/2020 11:51   IR US Guide Vasc Access Right  Result Date: 08/22/2020 INDICATION: 75 year old female referred for tunneled cuffed central venous catheter EXAM: IMAGE GUIDED PLACEMENT OF TUNNELED CUFFED CENTRAL VENOUS CATHETER MEDICATIONS: None ANESTHESIA/SEDATION: None FLUOROSCOPY TIME:  Fluoroscopy Time: 0 minutes 6 seconds (0 mGy). COMPLICATIONS: None PROCEDURE: Informed written consent was obtained from the patient after a thorough discussion of the procedural risks, benefits and alternatives. All questions were addressed. Maximal Sterile Barrier Technique was utilized including caps, mask, sterile gowns, sterile gloves, sterile drape, hand hygiene and skin antiseptic. A timeout was performed prior to the initiation of the procedure. After written informed consent was obtained, patient was placed in the supine position on angiographic table. Patency of the right internal jugular vein was confirmed with ultrasound with image documentation. Patient was prepped and draped in the usual sterile fashion including the right neck and right superior chest. Using ultrasound guidance, the skin and subcutaneous tissues overlying the right internal jugular vein were generously infiltrated with 1% lidocaine without epinephrine. Using ultrasound guidance, the right internal jugular vein was punctured with a micropuncture needle, and an 018 wire was advanced into the right heart confirming venous  access. A small stab incision was made with an 11 blade scalpel. Peel-away sheath was placed over the wire, and then the wire was removed, marking the wire for estimation of internal catheter length. The chest wall was then generously infiltrated with 1% lidocaine for local anesthesia along the tissue tract. Small stab incision was made with 11 blade scalpel, and then the catheter was back tunneled to the puncture site at the right internal jugular vein. Catheter was  pulled through the tract, with the catheter amputated at 18 cm. Catheter was advanced through the peel-away sheath, and the peel-away sheath was removed. Final image was stored. The catheter was anchored to the chest wall with 2 retention sutures, and Derma bond was used to seal the right internal jugular vein incision site and at the right chest wall. Patient tolerated the procedure well and remained hemodynamically stable throughout. No complications were encountered and no significant blood loss was encountered. IMPRESSION: Status post placement of right IJ tunneled cuffed central venous catheter. Signed, Dulcy Fanny. Dellia Nims, RPVI [facility] Interventional Radiology Specialists [facility] Radiology Electronically Signed   By: Corrie Mckusick D.O.   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 A minimum of 14 days of treatment but still would recommend getting TEE to rule out endocarditis. CSY findings of colitis could be reasons for translocation of bacteria/fungi  - continue with anidula as the antifungal with less interaction with tacrolimus  Renal transplant, immune suppression = continue with bactrim prophylaxis  Colitis = awaiting path results   [facility] for Infectious Diseases Cell: [phone] Pager: [phone]  08/23/2020, 2:15 PM

## 2020-08-23 NOTE — Interval H&P Note (Signed)
History and Physical Interval Note: For EGD and colonoscopy today to evaluate multifactorial anemia and heme positive stool.  She was only able to tolerate half of the bowel prep and states she cannot and will not do this again. We will proceed with upper endoscopy and I will plan to at least attempt colonoscopy.  However, if bowel prep inadequate this test will need to be aborted.  I discussed this with her extensively.  We again reviewed the risk, benefits and alternatives and she is agreeable and wishes to proceed.  CBC Latest Ref Rng & Units 08/22/2020 08/21/2020 08/20/2020  WBC 4.0 - 10.5 K/uL 4.7 4.1 3.3(L)  Hemoglobin 12.0 - 15.0 g/dL 9.3(L) 9.0(L) 8.5(L)  Hematocrit 36.0 - 46.0 % 29.9(L) 28.1(L) 26.2(L)  Platelets 150 - 400 K/uL 148(L) 140(L) 128(L)    08/23/2020 9:11 AM  Ruth Riley  has presented today for surgery, with the diagnosis of upper endoscopy.   colonoscopy.  The various methods of treatment have been discussed with the patient and family. After consideration of risks, benefits and other options for treatment, the patient has consented to  Procedure(s): ESOPHAGOGASTRODUODENOSCOPY (EGD) WITH PROPOFOL (N/A) as a surgical intervention.  The patient's history has been reviewed, patient examined, no change in status, stable for surgery.  I have reviewed the patient's chart and labs.  Questions were answered to the patient's satisfaction.     Lajuan Lines Ruth Riley

## 2020-08-23 NOTE — Op Note (Signed)
[facility] Patient Name: Ruth Riley Procedure Date : 08/23/2020 MRN: [id number] Attending MD: Jerene Bears , MD Date of Birth: Aug 07, 1945 CSN: [id number] Age: 75 Admit Type: Inpatient Procedure:                Colonoscopy Indications:              Heme positive stool and acute on chronic anemia Providers:                Lajuan Lines. Hilarie Fredrickson, MD, Carmie End, RN, Tyna Jaksch Technician Referring MD:             [facility] Medicines:                Monitored Anesthesia Care Complications:            No immediate complications. Estimated Blood Loss:     Estimated blood loss was minimal. Procedure:                Pre-Anesthesia Assessment:                           - Prior to the procedure, a History and Physical                            was performed, and patient medications and                            allergies were reviewed. The patient's tolerance of                            previous anesthesia was also reviewed. The risks                            and benefits of the procedure and the sedation                            options and risks were discussed with the patient.                            All questions were answered, and informed consent                            was obtained. Prior Anticoagulants: The patient has                            taken no previous anticoagulant or antiplatelet                            agents. ASA Grade Assessment: III - A patient with                            severe systemic disease. After reviewing the risks  and benefits, the patient was deemed in                            satisfactory condition to undergo the procedure.                           After obtaining informed consent, the colonoscope                            was passed under direct vision. Throughout the                            procedure, the patient's blood pressure, pulse, and                             oxygen saturations were monitored continuously. The                            [id number]-[id number] Olympus gastroscope was                            introduced through the anus and advanced to the                            hepatic flexure. The gastroscope was used because                            nursing reported she did not complete preparation                            and I first wanted to document adequate                            preparation. Thus the scope was withdrawn and                            replaced with the [id number]) Olympus                            pediatric colonoscope was introduced through the                            anus and advanced to the cecum identified by its                            appearance. The cecal base was not able to be                            confidently seen today and with inflammation as                            below further efforts to seat the scope in the  cecal base were not made. The colonoscopy was                            performed without difficulty. The patient tolerated                            the procedure well. The quality of the bowel                            preparation was adequate. Scope In: 10:40:49 AM Scope Out: 11:07:51 AM Scope Withdrawal Time: 0 hours 6 minutes 46 seconds  Total Procedure Duration: 0 hours 27 minutes 2 seconds  Findings:      Hemorrhoids were found on perianal exam.      Moderate mucosal changes characterized by adherent blood, congestion       (edema) and friability were found in the proximal ascending colon and in       the cecum. Biopsies were taken with a cold forceps for histology. After       a single biopsy there was immediate bleeding which did not abate after       30 sec to 1 min. To stop active bleeding, one hemostatic clip was       successfully placed. There was no bleeding at the end of the maneuver.       Etiology  of inflammation is not immediately clear, query ischemic       colitis and less likely IBD. No mass lesion was seen. As above the cecal       base was not fully seen.      A few small-mouthed diverticula were found in the sigmoid colon and       descending colon.      External and internal hemorrhoids were found during retroflexion, during       perianal exam and during digital exam. The hemorrhoids were medium-sized. Impression:               - Moderate mucosal changes with adherent blood was                            found in the proximal ascending colon and in the                            cecum (see findings above). Biopsied. Clip was                            placed for hemostasis after biopsy.                           - Diverticulosis in the sigmoid colon and in the                            descending colon. Mild and nonbleeding.                           - External and internal hemorrhoids.                           -  Cecal base not completely seen today as above. Moderate Sedation:      N/A Recommendation:           - Return patient to hospital ward for ongoing care.                           - Advance diet as tolerated.                           - Continue present medications.                           - Await pathology results.                           - Consider non-contrast CT scan abd/pelvis to                            evaluate further the right colon/cecum. Procedure Code(s):        --- Professional ---                           [phone], Colonoscopy, flexible; with control of                            bleeding, any method Diagnosis Code(s):        --- Professional ---                           K63.89, Other specified diseases of intestine                           K64.8, Other hemorrhoids                           R19.5, Other fecal abnormalities                           K57.30, Diverticulosis of large intestine without                            perforation or  abscess without bleeding CPT copyright 2019 [employer]. All rights reserved. The codes documented in this report are preliminary and upon coder review may  be revised to meet current compliance requirements. Jerene Bears, MD 08/23/2020 11:36:33 AM This report has been signed electronically. Number of Addenda: 0

## 2020-08-23 NOTE — Transfer of Care (Signed)
Immediate Anesthesia Transfer of Care Note  Patient: Ruth Riley  Procedure(s) Performed: ESOPHAGOGASTRODUODENOSCOPY (EGD) WITH PROPOFOL (N/A ) HOT HEMOSTASIS (ARGON PLASMA COAGULATION/BICAP) (N/A ) BIOPSY HEMOSTASIS CLIP PLACEMENT  Patient Location: PACU  Anesthesia Type:MAC  Level of Consciousness: drowsy and patient cooperative  Airway & Oxygen Therapy: Patient Spontanous Breathing and Patient connected to nasal cannula oxygen  Post-op Assessment: Report given to RN and Post -op Vital signs reviewed and stable  Post vital signs: Reviewed  Last Vitals:  Vitals Value Taken Time  BP 161/91 08/23/20 1117  Temp    Pulse 77 08/23/20 1121  Resp 21 08/23/20 1121  SpO2 95 % 08/23/20 1121  Vitals shown include unvalidated device data.  Last Pain:  Vitals:   08/23/20 0813  TempSrc: Oral  PainSc: 3       Patients Stated Pain Goal: 4 (88/50/27 7412)  Complications: No complications documented.

## 2020-08-23 NOTE — Progress Notes (Signed)
Pt. Was given bowel prep as scheduled and pt.fell asleep. I woke her up to take the prep and she refused to drink it. Pt. Become emotionally upset, stating "this is too hard and I don't want to do this anymore." attempt to calm pt. Down.attempt to encourage pt. To drink the prep. Pt. Refused and rolled over in the bed.

## 2020-08-23 NOTE — Anesthesia Procedure Notes (Signed)
Procedure Name: MAC Date/Time: 08/23/2020 10:15 AM Performed by: Janene Harvey, CRNA Pre-anesthesia Checklist: Patient identified, Emergency Drugs available, Suction available and Patient being monitored Patient Re-evaluated:Patient Re-evaluated prior to induction Oxygen Delivery Method: Nasal cannula Placement Confirmation: positive ETCO2 Dental Injury: Teeth and Oropharynx as per pre-operative assessment

## 2020-08-23 NOTE — TOC Progression Note (Signed)
Transition of Care [facility]) - Progression Note    Patient Details  Name: [name] MRN: [id number] Date of Birth: [birth year]  Transition of Care [facility]) CM/SW [place], LCSW Phone Number: 08/23/2020, 9:59 AM  Clinical Narrative:    CSW contacted [employer] regarding status of insurance approval. They have adjusted dates and approved patient: Ref [id number] ID [id number], effective 08/23/20-08/25/20.   Expected Discharge Plan: [place] Barriers to Discharge: Continued Medical Work up  Expected Discharge Plan and Services Expected Discharge Plan: [place] In-house Referral: Clinical Social Work Discharge Planning Services: CM Consult Post Acute Care Choice: [place] Living arrangements for the past 2 months: Apartment                                       Social Determinants of Health (SDOH) Interventions    Readmission Risk Interventions No flowsheet data found.

## 2020-08-23 NOTE — Op Note (Signed)
[facility] Patient Name: Ruth Riley Procedure Date : 08/23/2020 MRN: [id number] Attending MD: Jerene Bears , MD Date of Birth: 25-Jan-1946 CSN: [id number] Age: 75 Admit Type: Inpatient Procedure:                Upper GI endoscopy Indications:              Acute on chronic anemia, Heme positive stool Providers:                Lajuan Lines. Hilarie Fredrickson, MD, Mariana Arn, Carmie End,                            RN, Tyna Jaksch Technician Referring MD:             [facility] Medicines:                Monitored Anesthesia Care Complications:            No immediate complications. Estimated Blood Loss:     Estimated blood loss: none. Procedure:                Pre-Anesthesia Assessment:                           - Prior to the procedure, a History and Physical                            was performed, and patient medications and                            allergies were reviewed. The patient's tolerance of                            previous anesthesia was also reviewed. The risks                            and benefits of the procedure and the sedation                            options and risks were discussed with the patient.                            All questions were answered, and informed consent                            was obtained. Prior Anticoagulants: The patient has                            taken no previous anticoagulant or antiplatelet                            agents. ASA Grade Assessment: III - A patient with                            severe systemic disease. After reviewing the risks  and benefits, the patient was deemed in                            satisfactory condition to undergo the procedure.                           After obtaining informed consent, the endoscope was                            passed under direct vision. Throughout the                            procedure, the patient's blood pressure,  pulse, and                            oxygen saturations were monitored continuously. The                            [id number] Olympus gastroscope was                            introduced through the mouth, and advanced to the                            third part of duodenum. The upper GI endoscopy was                            accomplished without difficulty. The patient                            tolerated the procedure well. Scope In: Scope Out: Findings:      Normal mucosa was found in the entire esophagus.      A 5 cm hiatal hernia was found. The proximal extent of the gastric folds       (end of tubular esophagus) was 32 cm from the incisors. The hiatal       narrowing was 37 cm from the incisors.      Two 3 to 5 mm angioectasias, one with bleeding were found in the       proximal gastric body. Fulguration to stop the bleeding and ablated both       lesions by argon plasma at 1 liter/minute and 20 watts was successful       (ERBE APC stomach setting).      The exam of the stomach was otherwise normal.      The examined duodenum was normal. Impression:               - Normal mucosa was found in the entire esophagus.                           - 5 cm hiatal hernia.                           - Two angioectasias in the stomach, one with active  oozing. Treated with argon plasma coagulation (APC).                           - Normal examined duodenum.                           - No specimens collected. Moderate Sedation:      N/A Recommendation:           - Return patient to hospital ward for ongoing care.                           - Advance diet as tolerated.                           - Continue present medications.                           - Daily PPI is recommended.                           - See the other procedure note for documentation of                            additional recommendations. Procedure Code(s):        --- Professional  ---                           [phone], Esophagogastroduodenoscopy, flexible,                            transoral; with control of bleeding, any method Diagnosis Code(s):        --- Professional ---                           K44.9, Diaphragmatic hernia without obstruction or                            gangrene                           K31.811, Angiodysplasia of stomach and duodenum                            with bleeding                           D62, Acute posthemorrhagic anemia                           R19.5, Other fecal abnormalities CPT copyright 2019 [employer]. All rights reserved. The codes documented in this report are preliminary and upon coder review may  be revised to meet current compliance requirements. Jerene Bears, MD 08/23/2020 11:25:39 AM This report has been signed electronically. Number of Addenda: 0

## 2020-08-23 NOTE — Progress Notes (Addendum)
PROGRESS NOTE                                                                             PROGRESS NOTE                                                                                                                                                                                                             Patient Demographics:    Ruth Riley, is a 75 y.o. female, DOB - 25-Sep-1945, VCB:[id number]  Outpatient Primary MD for the patient is [name], MD    LOS - 6  Admit date - 08/16/2020    No chief complaint on file.      Brief Narrative     Ruth Riley is a 75 y.o. female with medical history significant of DM; HTN; and h/o renal transplant with current stage 4 CKD (baseline 3.7) with anemia (baseline 9) presenting to [facility] with confusion.  When asked what brought her to the hospital, the patient repeatedly returns to her litany of orthopedic complaints.  Most recently, she did fall and injure her R wrist and then fell again and injured her L hand.  She acknowledges some SOB and cough that appear to have started in the last week; she was feeling better after her COVID infection in early February. -She was noted to have Hemoccult-positive stools with hemoglobin dropped from 9-7.5, given lack of GI coverage at [facility] she was transferred to [facility]. -She required 1 unit PRBC transfusion since admission, with no further recurrence of GI bleed, her main complaint was right wrist/hand pain, as well her blood cultures on admissions were significant for anemia for which she is being followed by ID.   Subjective:    Mirta Mally today denies any chest pain or shortness of breath, she does report some abdominal bloating bloating after colonoscopy/EGD today, reports right wrist pain remains the same.     Assessment  & Plan :    Principal Problem:   Acute metabolic encephalopathy Active Problems:  Anemia in chronic kidney  disease   Diabetes mellitus type 2 in nonobese [facility])   Essential hypertension   H/O kidney transplant   Stage 4 chronic kidney disease (HCC)   Bilateral wrist pain   Multifocal pneumonia   History of COVID-19   Chronic back pain   Blood in the stool   Heme positive stool   Acute on chronic anemia   Inflammation of colonic mucosa   Angiodysplasia of stomach with hemorrhage    Acute metabolic encephalopathy -Patient presenting with encephalopathy as evidenced by her confusion and lethargy -While the patient does appear to also have dementia, this is a change compared to her usual baseline mental status -This appears to be multifactorial and related to PNA as well as possible GI bleed -Mentation back to baseline.  Multifocal PNA with h/o COVID-19 infection -Patient presenting with productive cough, mildly decreased oxygen saturation. -Chest x-ray at [facility] confirming subtle bibasilar opacities  -She was encouraged with incentive spirometry, flutter valve . -Treated with IV azithromycin and Rocephin . -She was encouraged to use incentive spirometry and flutter valve .  Fungemia -Cardiomyopathy appreciated, currently on IV unresponding, followed by ID, thought to be related to GI bleed. -2D echo with EF of 70%, no shunt, no vegetation could be seen. -She denies any visual complaints, seen by ophthalmology, no signs of intravenous endophthalmitis. -On Anidulafungin , she will need TEE per ID, so I have consulted Dr. Doylene Canard.  IJ tunneled CVC placed by IR/11. -  Anemia, due to upper GI bleed -He required 1 unit PRBC transfusion on 4/6, hemoglobin has been stable since.  . -Seen by GI, went for EGD/colonoscopy 4/12, colonoscopy significant for mucosal changes with congestion and friability in the proximal ascending colon and cecum, biopsy was obtained, EGD significant for 2 telangiectasias in the stomach, one was oozing, treated with argon plasma coagulation. -We will  start on full liquid diet, advance as tolerated. -recommendation for CT abdomen pelvis per GI for further evaluation for inflammation in the cecum and ascending colon,test is pending.  Stage 4 CKD, h/o renal transplant -Continue tacrolimus, initially on stress dose steroids, now back on prednisone. 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 Tears of the TFCC articular disc and foveal component of the triangular ligament. -Have discussed MRI finding with Dr. Lenon Curt, appears to be fluid collection related to inflammatory changes, no evidence of septic findings, recommendation is for this or colchicine which will be difficult here in the setting of her renal failure, so he does recommend steroids, give IV Solu-Medrol as needed, as well she will need to keep wrist splint for extended periods.  Back pain -continue home regmine    SpO2: 95 %  Recent Labs  Lab 08/16/20 1556 08/17/20 0109 08/18/20 0232 08/19/20 0158 08/20/20 0030 08/21/20 0018 08/22/20 0045  WBC 5.4   < > 4.9 3.0* 3.3* 4.1 4.7  PLT 134*   < > 132* 141* 128* 140* 148*  PROCALCITON 1.10  --   --   --   --   --   --   AST 15  --  18 16  --   --   --   ALT 16  --  15 16  --   --   --   ALKPHOS 70  --  67 62  --   --   --   BILITOT 1.0  --  0.5 0.4  --   --   --   ALBUMIN 2.6*  --  2.0* 1.9*  --   --   --    < > = values in this interval not displayed.       ABG  No results found for: PHART, PCO2ART, PO2ART, HCO3, TCO2, ACIDBASEDEF, O2SAT     Condition - Extremely  Guarded  Family Communication  : Discussed with her husband at bedside today.  Code Status :  DNR  Consults  :  ID, IR, GI, ophthalmology, cardiology Dr. Doylene Canard consulted for TEE.  Disposition Plan  :    Status is: Inpatient  Remains inpatient appropriate because:IV treatments appropriate due to intensity of illness or inability to take PO   Dispo: The patient is from: Home              Anticipated d/c is to: Home              Patient currently is not medically stable to d/c.   Difficult to place patient No      DVT Prophylaxis  : SCDs   Lab Results  Component Value Date   PLT 148 (L) 08/22/2020    Diet :  Diet Order            Diet full liquid Room service appropriate? Yes; Fluid consistency: Thin  Diet effective now                  Inpatient Medications  Scheduled Meds: . carvedilol  25 mg Oral BID  . Chlorhexidine Gluconate Cloth  6 each Topical Daily  . insulin aspart  0-9 Units Subcutaneous TID WC  . insulin glargine  10 Units Subcutaneous QHS  . pantoprazole  40 mg Oral BID  . peg 3350 powder  0.5 kit Oral Once  . predniSONE  10 mg Oral Q breakfast  . rOPINIRole  3 mg Oral QHS  . rosuvastatin  20 mg Oral Daily  . sertraline  100 mg Oral QHS  . sodium bicarbonate  1,300 mg Oral BID  . sulfamethoxazole-trimethoprim  1 tablet Oral Once per day on Mon Wed Fri  . tacrolimus  1 mg Oral TID   Continuous Infusions: . sodium chloride 50 mL/hr at 08/22/20 2321  . anidulafungin 100 mg (08/23/20 1251)   PRN Meds:.acetaminophen **OR** acetaminophen, albuterol, calcium carbonate (dosed in mg elemental calcium), camphor-menthol **AND** hydrOXYzine, docusate sodium, feeding supplement (NEPRO CARB STEADY), guaiFENesin, hydrALAZINE, HYDROcodone-acetaminophen, lidocaine, lip balm, ondansetron **OR** ondansetron (ZOFRAN) IV, oxyCODONE, polyethylene glycol, sorbitol, zolpidem  Antibiotics  :    Anti-infectives (From admission, onward)   Start     Dose/Rate Route  Frequency Ordered Stop   08/20/20 1000  cefdinir (OMNICEF) capsule 300 mg  Status:  Discontinued        300 mg Oral Daily 08/19/20 0918 08/19/20 0936   08/20/20 1000  cefdinir (OMNICEF) capsule 300 mg        300 mg Oral Daily 08/19/20 0936 08/21/20 0922   08/19/20 1015  azithromycin (ZITHROMAX) tablet 500 mg  Status:  Discontinued        500 mg Oral Daily 08/19/20 0916 08/19/20 0936   08/19/20 1015  azithromycin [facility]) tablet 500 mg        500 mg Oral Daily 08/19/20 0936 08/21/20 0921   08/19/20 1000  anidulafungin (ERAXIS) 100 mg in sodium chloride 0.9 % 100 mL IVPB        100 mg 78 mL/hr over 100 Minutes Intravenous Daily 08/18/20 0247 09/01/20 0959   08/18/20 0400  anidulafungin (ERAXIS) 200 mg in sodium chloride 0.9 % 200 mL IVPB        200 mg 78 mL/hr over 200 Minutes Intravenous  Once 08/18/20 0247 08/18/20 0704   08/17/20 0900  sulfamethoxazole-trimethoprim (BACTRIM) 400-80 MG per tablet 1 tablet        1 tablet Oral Once per day on Mon Wed Fri 08/16/20 1430     08/17/20 0800  cefTRIAXone (ROCEPHIN) 2 g in sodium chloride 0.9 % 100 mL IVPB  Status:  Discontinued        2 g 200 mL/hr over 30 Minutes Intravenous Every 24 hours 08/16/20 1430 08/19/20 0918   08/17/20 0800  azithromycin (ZITHROMAX) 500 mg in sodium chloride 0.9 % 250 mL IVPB  Status:  Discontinued        500 mg 250 mL/hr over 60 Minutes Intravenous Every 24 hours 08/16/20 1430 08/19/20 0916         Emeline Gins Marleny Faller M.D on 08/23/2020 at 1:52 PM  To page go to [email or web address]   [facility] -  Office  [phone]     Objective:   Vitals:   08/23/20 1115 08/23/20 1130 08/23/20 1147 08/23/20 1216  BP: (!) 161/91 (!) 168/88 (!) 178/97 (!) 184/104  Pulse: 78 74 82 82  Resp: (!) 21 20 17 16   Temp: (!) 97.5 F (36.4 C)     TempSrc:      SpO2: 96% 94% 94% 95%  Weight:      Height:        Wt Readings from Last 3 Encounters:  08/21/20 58 kg  08/08/20 56.7 kg  05/26/20 56.8 kg      Intake/Output Summary (Last 24 hours) at 08/23/2020 1352 Last data filed at 08/23/2020 1112 Gross per 24 hour  Intake 1360 ml  Output 620 ml  Net 740 ml     Physical Exam  Awake Alert, easily distracted, frail and deconditioned, no apparent distress  Symmetrical Chest wall movement, Good air movement bilaterally, CTAB RRR,No Gallops,Rubs or new Murmurs, No Parasternal Heave, right upper chest tunneled catheter +ve B.Sounds, Abd Soft, No tenderness, No rebound - guarding or rigidity. No Cyanosis, Clubbing or edema, right arm elevated    Data Review:    CBC Recent Labs  Lab 08/16/20 1556 08/17/20 0109 08/18/20 0232 08/19/20 0158 08/20/20 0030 08/21/20 0018 08/22/20 0045  WBC 5.4   < > 4.9 3.0* 3.3* 4.1 4.7  HGB 8.5*   < > 8.1* 8.4* 8.5* 9.0* 9.3*  HCT 26.5*   < > 24.2* 26.2* 26.2* 28.1* 29.9*  PLT 134*   < > 132* 141* 128* 140* 148*  MCV 93.3   < > 86.7 87.0 88.2 88.4 89.3  MCH 29.9   < > 29.0 27.9 28.6 28.3 27.8  MCHC 32.1   < > 33.5 32.1 32.4 32.0 31.1  RDW 16.5*   < > 19.7* 19.2* 18.7* 17.9* 17.7*  LYMPHSABS 0.5*  --   --   --   --   --   --   MONOABS 0.2  --   --   --   --   --   --  EOSABS 0.1  --   --   --   --   --   --   BASOSABS 0.0  --   --   --   --   --   --    < > = values in this interval not displayed.    Recent Labs  Lab 08/16/20 1556 08/17/20 0109 08/18/20 0232 08/19/20 0158 08/20/20 0030 08/21/20 0018 08/22/20 0045  NA 135   < > 132* 135 133* 134* 132*  K 4.8   < > 4.2 3.4* 3.7 3.7 4.3  CL 99   < > 101 101 104 104 104  CO2 27   < > 23 23 22 24  20*  GLUCOSE 104*   < > 229* 122* 189* 155* 261*  BUN 56*   < > 59* 56* 53* 50* 52*  CREATININE 3.77*   < > 3.48* 3.44* 3.17* 3.08* 3.01*  CALCIUM 8.9   < > 7.7* 8.0* 7.6* 7.9* 7.6*  AST 15  --  18 16  --   --   --   ALT 16  --  15 16  --   --   --   ALKPHOS 70  --  67 62  --   --   --   BILITOT 1.0  --  0.5 0.4  --   --   --   ALBUMIN 2.6*  --  2.0* 1.9*  --   --   --   PROCALCITON 1.10   --   --   --   --   --   --   TSH 2.903  --   --   --   --   --   --    < > = values in this interval not displayed.    ------------------------------------------------------------------------------------------------------------------ No results for input(s): CHOL, HDL, LDLCALC, TRIG, CHOLHDL, LDLDIRECT in the last 72 hours.  No results found for: HGBA1C ------------------------------------------------------------------------------------------------------------------ No results for input(s): TSH, T4TOTAL, T3FREE, THYROIDAB in the last 72 hours.  Invalid input(s): FREET3  Cardiac Enzymes No results for input(s): CKMB, TROPONINI, MYOGLOBIN in the last 168 hours.  Invalid input(s): CK ------------------------------------------------------------------------------------------------------------------ No results found for: BNP  Micro Results Recent Results (from the past 240 hour(s))  Culture, blood (routine x 2) Call MD if unable to obtain prior to antibiotics being given     Status: Abnormal (Preliminary result)   Collection Time: 08/16/20  3:50 PM   Specimen: BLOOD  Result Value Ref Range Status   Specimen Description BLOOD RIGHT ANTECUBITAL  Final   Special Requests   Final    BOTTLES DRAWN AEROBIC AND ANAEROBIC Blood Culture results may not be optimal due to an inadequate volume of blood received in culture bottles   Culture  Setup Time (A)  Final    YEAST AEROBIC BOTTLE ONLY CRITICAL RESULT CALLED TO, READ BACK BY AND VERIFIED WITH: PHARMD ABBOTT [phone] FCP    Culture (A)  Final    CANDIDA ALBICANS Sent to [place] for further susceptibility testing. Performed at [facility], [place] [address] [place], [place] [address]    Report Status PENDING  Incomplete  Blood Culture ID Panel (Reflexed)     Status: Abnormal   Collection Time: 08/16/20  3:50 PM  Result Value Ref Range Status   Enterococcus faecalis NOT DETECTED NOT DETECTED Final   Enterococcus Faecium NOT  DETECTED NOT DETECTED Final   Listeria monocytogenes NOT DETECTED NOT DETECTED Final   Staphylococcus species  NOT DETECTED NOT DETECTED Final   Staphylococcus aureus (BCID) NOT DETECTED NOT DETECTED Final   Staphylococcus epidermidis NOT DETECTED NOT DETECTED Final   Staphylococcus lugdunensis NOT DETECTED NOT DETECTED Final   Streptococcus species NOT DETECTED NOT DETECTED Final   Streptococcus agalactiae NOT DETECTED NOT DETECTED Final   Streptococcus pneumoniae NOT DETECTED NOT DETECTED Final   Streptococcus pyogenes NOT DETECTED NOT DETECTED Final   A.calcoaceticus-baumannii NOT DETECTED NOT DETECTED Final   Bacteroides fragilis NOT DETECTED NOT DETECTED Final   Enterobacterales NOT DETECTED NOT DETECTED Final   Enterobacter cloacae complex NOT DETECTED NOT DETECTED Final   Escherichia coli NOT DETECTED NOT DETECTED Final   Klebsiella aerogenes NOT DETECTED NOT DETECTED Final   Klebsiella oxytoca NOT DETECTED NOT DETECTED Final   Klebsiella pneumoniae NOT DETECTED NOT DETECTED Final   Proteus species NOT DETECTED NOT DETECTED Final   Salmonella species NOT DETECTED NOT DETECTED Final   Serratia marcescens NOT DETECTED NOT DETECTED Final   Haemophilus influenzae NOT DETECTED NOT DETECTED Final   Neisseria meningitidis NOT DETECTED NOT DETECTED Final   Pseudomonas aeruginosa NOT DETECTED NOT DETECTED Final   Stenotrophomonas maltophilia NOT DETECTED NOT DETECTED Final   Candida albicans DETECTED (A) NOT DETECTED Final    Comment: CRITICAL RESULT CALLED TO, READ BACK BY AND VERIFIED WITH: PHARMD ABBOTT [phone] FCP    Candida auris NOT DETECTED NOT DETECTED Final   Candida glabrata NOT DETECTED NOT DETECTED Final   Candida krusei NOT DETECTED NOT DETECTED Final   Candida parapsilosis NOT DETECTED NOT DETECTED Final   Candida tropicalis NOT DETECTED NOT DETECTED Final   Cryptococcus neoformans/gattii NOT DETECTED NOT DETECTED Final    Comment: Performed at [facility], [address], [place], [place] [address]  Antifungal AST 9 Drug Panel     Status: None (Preliminary result)   Collection Time: 08/16/20  3:50 PM  Result Value Ref Range Status   Organism ID, Yeast Preliminary report  Final    Comment: (NOTE) Specimen has been received and testing has been initiated. Performed At: [facility] [place], [place] [id number] Rush Farmer MD PJ:[id number]    Amphotericin B MIC PENDING  Incomplete   Please Note: PENDING  Incomplete   Anidulafungin MIC PENDING  Incomplete   Caspofungin MIC PENDING  Incomplete   Micafungin MIC PENDING  Incomplete   Posaconazole MIC PENDING  Incomplete   Please Note: PENDING  Incomplete   Fluconazole Islt MIC PENDING  Incomplete   Flucytosine MIC PENDING  Incomplete   Itraconazole MIC PENDING  Incomplete   Voriconazole MIC PENDING  Incomplete   Source CANDIDA ALBICANS/ BLOOD  Final    Comment: Performed at [facility], [place]. [address], [place], [place] [address]  Culture, blood (routine x 2) Call MD if unable to obtain prior to antibiotics being given     Status: Abnormal   Collection Time: 08/16/20  3:52 PM   Specimen: BLOOD LEFT ARM  Result Value Ref Range Status   Specimen Description BLOOD LEFT ARM  Final   Special Requests   Final    BOTTLES DRAWN AEROBIC AND ANAEROBIC Blood Culture results may not be optimal due to an inadequate volume of blood received in culture bottles   Culture  Setup Time (A)  Final    YEAST AEROBIC BOTTLE ONLY CRITICAL VALUE NOTED.  VALUE IS CONSISTENT WITH PREVIOUSLY REPORTED AND CALLED VALUE. Performed at [facility], [place],  [place] [address]    Culture CANDIDA ALBICANS (A)  Final   Report Status 08/21/2020 FINAL  Final  Culture, blood (routine x 2)     Status: None   Collection Time: 08/18/20  7:51 AM   Specimen: BLOOD  Result Value Ref Range Status   Specimen Description BLOOD RIGHT ANTECUBITAL  Final   Special Requests    Final    BOTTLES DRAWN AEROBIC AND ANAEROBIC Blood Culture adequate volume   Culture   Final    NO GROWTH 5 DAYS Performed at [facility], [place] [address] [place], [place] [address]    Report Status 08/23/2020 FINAL  Final  Culture, blood (routine x 2)     Status: None   Collection Time: 08/18/20  8:03 AM   Specimen: BLOOD LEFT WRIST  Result Value Ref Range Status   Specimen Description BLOOD LEFT WRIST  Final   Special Requests   Final    BOTTLES DRAWN AEROBIC AND ANAEROBIC Blood Culture adequate volume   Culture   Final    NO GROWTH 5 DAYS Performed at [facility], [place] [address] [place], [place] [address]    Report Status 08/23/2020 FINAL  Final    Radiology Reports MR HAND RIGHT WO CONTRAST  Result Date: 08/20/2020 CLINICAL DATA:  Right hand and wrist pain.  Recent falls. EXAM: MRI OF THE RIGHT HAND WITHOUT CONTRAST; MRI OF THE RIGHT WRIST WITHOUT CONTRAST TECHNIQUE: Multiplanar, multisequence MR imaging of the right hand and wrist was performed. No intravenous contrast was administered. COMPARISON:  Right hand x-rays dated August 01, 2020. FINDINGS: Ligaments: Chronic complete tear of the scapholunate ligament with widening of the scapholunate interval. The lunotriquetral ligament is intact. Triangular fibrocartilage: There is a small partial tear of the articular disc ulnar surface. The foveal component of the triangular ligament is completely torn. Slight ulnar minus variance. Tendons: Flexor and extensor tendons are intact. Large amount of fluid in the flexor tendon sheaths. Mild extensor carpi ulnaris tendinosis with small amount of fluid in the tendon sheath. Carpal tunnel/median nerve: Unremarkable. Guyon's canal: Unremarkable. Joint/cartilage: Scattered cartilage loss about the wrist with large areas of full-thickness cartilage loss in the radioscaphoid and scaphotrapeziotrapezoid joints. Scattered small carpal bone erosions. Additional small erosions of the second  and third MCP joints with synovial thickening. Large radiocarpal and midcarpal joint effusions.Small distal radioulnar joint effusion. 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 Moderate first and third MCP joint osteoarthritis. Moderate to severe DIP joint osteoarthritis. Bones/carpal alignment: Chronic widening of the scapholunate interval. Early proximal migration of the capitate.No suspicious marrow signal abnormality. No acute fracture or dislocation. Other: Severe soft tissue swelling of the wrist and hand. No fluid collection. IMPRESSION: 1. No acute abnormality.  No fracture or evidence of osteomyelitis. 2. 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 3. Tears of the TFCC articular disc and foveal component of the triangular ligament. 4. Early SLAC wrist. 5. 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 6. Severe soft tissue swelling of the wrist and hand. No fluid collection. Electronically Signed   By: Titus Dubin M.D.   On: 08/20/2020 09:24   DG Hand 2 View Left  Result Date: 08/19/2020 CLINICAL DATA:  Pain EXAM: LEFT HAND - 2 VIEW COMPARISON:  None. FINDINGS: Frontal and  lateral views obtained. There has been previous apparent removal of the trapezium bone. Postoperative changes noted in the proximal second metacarpal and trapezoid bones. Minimal calcification is noted in the expected location of the trapezium bone. No acute fracture or dislocation. Bones are osteoporotic. There is marked narrowing of all D IP joints with spurring in all of these joints. There is moderate narrowing in the MCP and PIP joints. No erosion or periostitis evident. Bones are somewhat osteopenic IMPRESSION: 1. Apparent previous removal of the  trapezium bone with a few tiny calcifications in this area. 2. 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 No erosions. 3.  No acute fracture or dislocation. 4.  Bones osteoporotic. Electronically Signed   By: Lowella Grip III M.D.   On: 08/19/2020 13:53   MR WRIST RIGHT WO CONTRAST  Result Date: 08/20/2020 CLINICAL DATA:  Right hand and wrist pain.  Recent falls. EXAM: MRI OF THE RIGHT HAND WITHOUT CONTRAST; MRI OF THE RIGHT WRIST WITHOUT CONTRAST TECHNIQUE: Multiplanar, multisequence MR imaging of the right hand and wrist was performed. No intravenous contrast was administered. COMPARISON:  Right hand x-rays dated August 01, 2020. FINDINGS: Ligaments: Chronic complete tear of the scapholunate ligament with widening of the scapholunate interval. The lunotriquetral ligament is intact. Triangular fibrocartilage: There is a small partial tear of the articular disc ulnar surface. The foveal component of the triangular ligament is completely torn. Slight ulnar minus variance. Tendons: Flexor and extensor tendons are intact. Large amount of fluid in the flexor tendon sheaths. Mild extensor carpi ulnaris tendinosis with small amount of fluid in the tendon sheath. Carpal tunnel/median nerve: Unremarkable. Guyon's canal: Unremarkable. Joint/cartilage: Scattered cartilage loss about the wrist with large areas of full-thickness cartilage loss in the radioscaphoid and scaphotrapeziotrapezoid joints. Scattered small carpal bone erosions. Additional small erosions of the second and third MCP joints with synovial thickening. Large radiocarpal and midcarpal joint effusions.Small distal radioulnar joint effusion. 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 Moderate first and third MCP joint osteoarthritis. Moderate to severe DIP joint osteoarthritis. Bones/carpal alignment: Chronic widening of the scapholunate interval. Early proximal migration of the capitate.No suspicious marrow signal  abnormality. No acute fracture or dislocation. Other: Severe soft tissue swelling of the wrist and hand. No fluid collection. IMPRESSION: 1. No acute abnormality.  No fracture or evidence of osteomyelitis. 2. 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 3. Tears of the TFCC articular disc and foveal component of the triangular ligament. 4. Early SLAC wrist. 5. 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 6. Severe soft tissue swelling of the wrist and hand. No fluid collection. Electronically Signed   By: Titus Dubin M.D.   On: 08/20/2020 09:24   IR Fluoro Guide CV Line Right  Result Date: 08/22/2020 INDICATION: 75 year old female referred for tunneled cuffed central venous catheter EXAM: IMAGE GUIDED PLACEMENT OF TUNNELED CUFFED CENTRAL VENOUS CATHETER MEDICATIONS: None ANESTHESIA/SEDATION: None FLUOROSCOPY TIME:  Fluoroscopy Time: 0 minutes 6 seconds (0 mGy). COMPLICATIONS: None PROCEDURE: Informed written consent was obtained from the patient after a thorough discussion of the procedural risks, benefits and alternatives. All questions were addressed. Maximal Sterile Barrier Technique was utilized including caps, mask, sterile gowns, sterile gloves, sterile drape, hand hygiene and skin antiseptic. A timeout was performed prior to the initiation of  the procedure. After written informed consent was obtained, patient was placed in the supine position on angiographic table. Patency of the right internal jugular vein was confirmed with ultrasound with image documentation. Patient was prepped and draped in the usual sterile fashion including the right neck and right superior chest. Using ultrasound guidance, the skin and subcutaneous tissues  overlying the right internal jugular vein were generously infiltrated with 1% lidocaine without epinephrine. Using ultrasound guidance, the right internal jugular vein was punctured with a micropuncture needle, and an 018 wire was advanced into the right heart confirming venous access. A small stab incision was made with an 11 blade scalpel. Peel-away sheath was placed over the wire, and then the wire was removed, marking the wire for estimation of internal catheter length. The chest wall was then generously infiltrated with 1% lidocaine for local anesthesia along the tissue tract. Small stab incision was made with 11 blade scalpel, and then the catheter was back tunneled to the puncture site at the right internal jugular vein. Catheter was pulled through the tract, with the catheter amputated at 18 cm. Catheter was advanced through the peel-away sheath, and the peel-away sheath was removed. Final image was stored. The catheter was anchored to the chest wall with 2 retention sutures, and Derma bond was used to seal the right internal jugular vein incision site and at the right chest wall. Patient tolerated the procedure well and remained hemodynamically stable throughout. No complications were encountered and no significant blood loss was encountered. IMPRESSION: Status post placement of right IJ tunneled cuffed central venous catheter. Signed, Dulcy Fanny. Dellia Nims, RPVI [facility] Radiology Electronically Signed   By: Corrie Mckusick D.O.   On: 08/22/2020 11:51   IR US Guide Vasc Access Right  Result Date: 08/22/2020 INDICATION: 75 year old female referred for tunneled cuffed central venous catheter EXAM: IMAGE GUIDED PLACEMENT OF TUNNELED CUFFED CENTRAL VENOUS CATHETER MEDICATIONS: None ANESTHESIA/SEDATION: None FLUOROSCOPY TIME:  Fluoroscopy Time: 0 minutes 6 seconds (0 mGy). COMPLICATIONS: None PROCEDURE: Informed written consent was obtained from the patient  after a thorough discussion of the procedural risks, benefits and alternatives. All questions were addressed. Maximal Sterile Barrier Technique was utilized including caps, mask, sterile gowns, sterile gloves, sterile drape, hand hygiene and skin antiseptic. A timeout was performed prior to the initiation of the procedure. After written informed consent was obtained, patient was placed in the supine position on angiographic table. Patency of the right internal jugular vein was confirmed with ultrasound with image documentation. Patient was prepped and draped in the usual sterile fashion including the right neck and right superior chest. Using ultrasound guidance, the skin and subcutaneous tissues overlying the right internal jugular vein were generously infiltrated with 1% lidocaine without epinephrine. Using ultrasound guidance, the right internal jugular vein was punctured with a micropuncture needle, and an 018 wire was advanced into the right heart confirming venous access. A small stab incision was made with an 11 blade scalpel. Peel-away sheath was placed over the wire, and then the wire was removed, marking the wire for estimation of internal catheter length. The chest wall was then generously infiltrated with 1% lidocaine for local anesthesia along the tissue tract. Small stab incision was made with 11 blade scalpel, and then the catheter was back tunneled to the puncture site at the right internal jugular vein. Catheter was pulled through the tract, with the catheter amputated at 18 cm. Catheter was advanced through the peel-away sheath, and the peel-away sheath  was removed. Final image was stored. The catheter was anchored to the chest wall with 2 retention sutures, and Derma bond was used to seal the right internal jugular vein incision site and at the right chest wall. Patient tolerated the procedure well and remained hemodynamically stable throughout. No complications were encountered and no significant  blood loss was encountered. IMPRESSION: Status post placement of right IJ tunneled cuffed central venous catheter. Signed, Dulcy Fanny. Dellia Nims, RPVI [facility] Radiology Electronically Signed   By: Corrie Mckusick D.O.   On: 08/22/2020 11:51   DG Chest Port 1 View  Result Date: 08/17/2020 CLINICAL DATA:  Dyspnea. EXAM: PORTABLE CHEST 1 VIEW COMPARISON:  August 15, 2020. FINDINGS: Stable cardiomediastinal silhouette. No pneumothorax or pleural effusion is noted. No consolidative process is noted. Bony thorax is unremarkable. IMPRESSION: No active disease. Electronically Signed   By: Marijo Conception M.D.   On: 08/17/2020 08:46   ECHOCARDIOGRAM COMPLETE  Result Date: 08/19/2020    ECHOCARDIOGRAM REPORT   Patient Name:   ALLECIA BELLS [facility] Date of Exam: 08/19/2020 Medical Rec #:  [id number]              Height:       61.0 in Accession #:    [id number]             Weight:       126.9 lb Date of Birth:  [birth year]              BSA:          1.557 m Patient Age:    47 years               BP:           135/85 mmHg Patient Gender: F                      HR:           74 bpm. Exam Location:  Inpatient Procedure: 2D Echo, Cardiac Doppler and Color Doppler Indications:     Candidemia  History:         Patient has no prior history of Echocardiogram examinations.                  Risk Factors:Hypertension and Diabetes.  Sonographer:     [place], [place], RVT, RDCS Referring Phys:  [id number] [facility] Diagnosing Phys: Oswaldo Milian MD  Sonographer Comments: Restricted mobility; unable to turn patient due to shoulder injury. IMPRESSIONS  1. Left ventricular ejection fraction, by estimation, is 70 to 75%. The left ventricle has hyperdynamic function. The left ventricle has no regional wall motion abnormalities. Left ventricular diastolic parameters are consistent with Grade II diastolic dysfunction (pseudonormalization). Elevated left atrial pressure.  Hyperdynamic LV function with mid cavitary/LVOT gradient up to 23 mmHg  2. Right ventricular systolic function is normal. The right ventricular size is normal. There is mildly elevated pulmonary artery systolic pressure. The estimated right ventricular systolic pressure is 48.5 mmHg.  3. Right atrial size was mildly dilated.  4. The mitral valve is normal in structure. No evidence of mitral valve regurgitation.  5. The aortic valve was not well visualized. Aortic valve regurgitation is trivial. No aortic stenosis is present.  6. The inferior vena cava is normal in size with greater than 50% respiratory variability, suggesting right atrial pressure of 3 mmHg. Conclusion(s)/Recommendation(s): No vegetation seen, but technically difficult study. If high clinical  suspicion for endocarditis, recommend TEE. FINDINGS  Left Ventricle: Left ventricular ejection fraction, by estimation, is 70 to 75%. The left ventricle has hyperdynamic function. The left ventricle has no regional wall motion abnormalities. The left ventricular internal cavity size was small. There is no  left ventricular hypertrophy. Left ventricular diastolic parameters are consistent with Grade II diastolic dysfunction (pseudonormalization). Elevated left atrial pressure. Right Ventricle: The right ventricular size is normal. No increase in right ventricular wall thickness. Right ventricular systolic function is normal. There is mildly elevated pulmonary artery systolic pressure. The tricuspid regurgitant velocity is 2.88  m/s, and with an assumed right atrial pressure of 3 mmHg, the estimated right ventricular systolic pressure is 81.8 mmHg. Left Atrium: Left atrial size was normal in size. Right Atrium: Right atrial size was mildly dilated. Pericardium: There is no evidence of pericardial effusion. Mitral Valve: The mitral valve is normal in structure. No evidence of mitral valve regurgitation. MV peak gradient, 17.8 mmHg. The mean mitral valve gradient  is 5.0 mmHg. Tricuspid Valve: The tricuspid valve is normal in structure. Tricuspid valve regurgitation is trivial. Aortic Valve: The aortic valve was not well visualized. Aortic valve regurgitation is trivial. No aortic stenosis is present. Pulmonic Valve: The pulmonic valve was not well visualized. Pulmonic valve regurgitation is not visualized. Aorta: The aortic root is normal in size and structure. Venous: The inferior vena cava is normal in size with greater than 50% respiratory variability, suggesting right atrial pressure of 3 mmHg. IAS/Shunts: The interatrial septum was not well visualized.  LEFT VENTRICLE PLAX 2D LVIDd:         3.20 cm  Diastology LVIDs:         2.20 cm  LV e' medial:    3.70 cm/s LV PW:         0.90 cm  LV E/e' medial:  26.3 LV IVS:        0.70 cm  LV e' lateral:   4.03 cm/s LVOT diam:     1.30 cm  LV E/e' lateral: 24.2 LV SV:         44 LV SV Index:   28 LVOT Area:     1.33 cm  RIGHT VENTRICLE RV S prime:     13.30 cm/s TAPSE (M-mode): 1.6 cm LEFT ATRIUM             Index       RIGHT ATRIUM           Index LA diam:        3.50 cm 2.25 cm/m  RA Area:     16.70 cm LA Vol (A2C):   29.5 ml 18.95 ml/m RA Volume:   43.60 ml  28.01 ml/m LA Vol (A4C):   39.1 ml 25.12 ml/m LA Biplane Vol: 33.4 ml 21.46 ml/m  AORTIC VALVE LVOT Vmax:   194.00 cm/s LVOT Vmean:  146.000 cm/s LVOT VTI:    0.333 m  AORTA Ao Root diam: 2.70 cm MITRAL VALVE                TRICUSPID VALVE MV Area (PHT): 2.21 cm     TR Peak grad:   33.2 mmHg MV Area VTI:   0.71 cm     TR Vmax:        288.00 cm/s MV Peak grad:  17.8 mmHg MV Mean grad:  5.0 mmHg     SHUNTS MV Vmax:       2.11 m/s     Systemic VTI:  0.33 m MV Vmean:      105.0 cm/s   Systemic Diam: 1.30 cm MV Decel Time: 343 msec MV E velocity: 97.40 cm/s MV A velocity: 178.00 cm/s MV E/A ratio:  0.55 Oswaldo Milian MD Electronically signed by Oswaldo Milian MD Signature Date/Time: 08/19/2020/9:03:51 PM    Final (Updated)

## 2020-08-24 ENCOUNTER — Encounter (HOSPITAL_COMMUNITY): Payer: Self-pay | Admitting: Internal Medicine

## 2020-08-24 ENCOUNTER — Inpatient Hospital Stay (HOSPITAL_COMMUNITY): Payer: Medicare Other | Admitting: Anesthesiology

## 2020-08-24 ENCOUNTER — Inpatient Hospital Stay (HOSPITAL_COMMUNITY): Payer: Medicare Other

## 2020-08-24 ENCOUNTER — Encounter (HOSPITAL_COMMUNITY)
Admission: AD | Disposition: A | Discharge status: Skilled Nursing Facility | Payer: Self-pay | Source: Other Acute Inpatient Hospital | Attending: Internal Medicine

## 2020-08-24 DIAGNOSIS — G9341 Metabolic encephalopathy: Secondary | ICD-10-CM | POA: Diagnosis not present

## 2020-08-24 DIAGNOSIS — D649 Anemia, unspecified: Secondary | ICD-10-CM | POA: Diagnosis not present

## 2020-08-24 DIAGNOSIS — K31811 Angiodysplasia of stomach and duodenum with bleeding: Secondary | ICD-10-CM

## 2020-08-24 DIAGNOSIS — M25531 Pain in right wrist: Secondary | ICD-10-CM | POA: Diagnosis not present

## 2020-08-24 HISTORY — PX: TEE WITHOUT CARDIOVERSION: SHX5443

## 2020-08-24 HISTORY — PX: BUBBLE STUDY: SHX6837

## 2020-08-24 LAB — GLUCOSE, CAPILLARY
Glucose-Capillary: 96 mg/dL (ref 70–99)
Glucose-Capillary: 125 mg/dL — ABNORMAL HIGH (ref 70–99)
Glucose-Capillary: 198 mg/dL — ABNORMAL HIGH (ref 70–99)
Glucose-Capillary: 162 mg/dL — ABNORMAL HIGH (ref 70–99)

## 2020-08-24 LAB — STREP PNEUMONIAE URINARY ANTIGEN: Strep Pneumo Urinary Antigen: NEGATIVE

## 2020-08-24 LAB — SURGICAL PATHOLOGY

## 2020-08-24 SURGERY — ECHOCARDIOGRAM, TRANSESOPHAGEAL
Anesthesia: Monitor Anesthesia Care

## 2020-08-24 MED ORDER — HYDRALAZINE HCL 20 MG/ML IJ SOLN
5.0000 mg | Freq: Once | INTRAMUSCULAR | Status: AC
  Administered 2020-08-24: 5 mg via INTRAVENOUS

## 2020-08-24 MED ORDER — HEPARIN (PORCINE) 25000 UT/250ML-% IV SOLN
1000.0000 [IU]/h | INTRAVENOUS | Status: DC
  Administered 2020-08-24: 700 [IU]/h via INTRAVENOUS
  Administered 2020-08-26 – 2020-08-28 (×3): 1000 [IU]/h via INTRAVENOUS
  Filled 2020-08-24 (×5): qty 250

## 2020-08-24 MED ORDER — SODIUM CHLORIDE 0.9 % IV SOLN
200.0000 mg | Freq: Every day | INTRAVENOUS | Status: AC
  Administered 2020-08-25 – 2020-08-31 (×7): 200 mg via INTRAVENOUS
  Filled 2020-08-24 (×8): qty 200

## 2020-08-24 MED ORDER — PHENYLEPHRINE 40 MCG/ML (10ML) SYRINGE FOR IV PUSH (FOR BLOOD PRESSURE SUPPORT)
PREFILLED_SYRINGE | INTRAVENOUS | Status: DC | PRN
  Administered 2020-08-24: 80 ug via INTRAVENOUS
  Administered 2020-08-24: 40 ug via INTRAVENOUS

## 2020-08-24 MED ORDER — HYDRALAZINE HCL 20 MG/ML IJ SOLN
5.0000 mg | Freq: Once | INTRAMUSCULAR | Status: DC
  Filled 2020-08-24: qty 1

## 2020-08-24 MED ORDER — HYDRALAZINE HCL 20 MG/ML IJ SOLN
INTRAMUSCULAR | Status: AC
  Filled 2020-08-24: qty 1

## 2020-08-24 MED ORDER — BUTAMBEN-TETRACAINE-BENZOCAINE 2-2-14 % EX AERO
INHALATION_SPRAY | CUTANEOUS | Status: DC | PRN
  Administered 2020-08-24: 1 via TOPICAL

## 2020-08-24 MED ORDER — SODIUM CHLORIDE 0.9 % IV SOLN: INTRAVENOUS | Status: DC

## 2020-08-24 MED ORDER — PROPOFOL 500 MG/50ML IV EMUL
INTRAVENOUS | Status: DC | PRN
  Administered 2020-08-24: 100 ug/kg/min via INTRAVENOUS

## 2020-08-24 NOTE — Progress Notes (Signed)
PROGRESS NOTE        PATIENT DETAILS Name: Ruth Riley Age: 75 y.o. Sex: female Date of Birth: March 28, 1946 Admit Date: 08/16/2020 Admitting Physician Karmen Bongo, MD WLN:[id number], [name], MD  Brief Narrative: Patient is a 75 y.o. female with history of renal transplant, CKD stage IV, DM-2, HTN, HLD-presented to the hospital with multiple orthopedic complaints-s/p multiple falls with both right/left hand injury, confusion, worsening anemia-she was subsequently transferred from [facility] to [facility] for GI evaluation.  See below for further details.   Significant events: 4/5>> transferred from [facility] to [facility]  Significant studies: 4/6>> chest x-ray: No active disease. 4/8>> x-ray left hand: No acute fracture or dislocation. 4/8>> MRI right hand/wrist: No fracture/osteomyelitis-severe flexor Tina synovitis, second/third MCP joint erosions, enlarged radiocarpal midcarpal joint infusion-suggestive of inflammatory arthropathy. 4/8>> Echo: EF 94-17%, grade 2 diastolic dysfunction 4/08>> CT abdomen/pelvis: No perforation/focal inflammation in the bowel-autosomal dominant polycystic kidney disease.  Mild anasarca.  Antimicrobial therapy: Rocephin: 4/6>> 4/8 Zithromax: 4/6>> 4/10 Cefdinir: 4/9>> 4/10 Eraxis: 04/7>>  Microbiology data: 4/5>> blood culture: Candida albicans 4/7>> blood culture: No growth  Procedures : 4/11>> right IJ tunneled CVC 4/12>> EGD: 2 angiectasia is in the stomach-1 with active oozing-s/p APC. 4/12>> colonoscopy: Edema/adherent blood in the proximal ascending colon/cecum-?  Ischemic colitis.  Diverticulosis in the sigmoid/descending colon.  Consults: GI, ID, IR, cards  DVT Prophylaxis : SCDs Start: 08/16/20 1429   Subjective: Awake/alert-looks chronically sick appearing.   Assessment/Plan: Acute metabolic encephalopathy: Probably related to fungemia/pneumonia-resolved-suspect her mentation is back to  baseline.  Multifocal pneumonia-recent history of COVID-19 infection: Overall improved-completed course of antibiotics.  Cultures as above.  Fungemia: On Eraxis-repeat blood cultures negative-no obvious vegetation seen on TTE-for TEE later today.  Upper GI bleeding with acute blood loss anemia: EGD/colonoscopy as above-no further obvious GI loss apparent-has required PRBC transfusion this hospital stay-follow-up CBC closely.  Colonoscopy biopsies pending.  Remains on PPI.  AKI on CKD stage IV-renal transplantation: AKI likely hemodynamically mediated-has down trended-and now close to baseline.  History of renal transplantation: Continue steroids/tacrolimus-on prophylactic Bactrim.  HTN: BP controlled-continue Coreg  HLD: Continue statin  DM-2: CBGs stable-watch closely for hypoglycemia-continue Lantus 10 units daily and SSI.   Recent Labs    08/23/20 1644 08/23/20 2056 08/24/20 0755  GLUCAP 86 230* 96   Right wrist pain/swelling: See above regarding MRI imaging-prior MD discussed with hand surgery-Dr. Hilbert Corrigan thought that this was related to inflammatory changes-recommendations were to keep splint for extended periods and steroids.  Chronic back pain: Continue supportive care.  Deconditioning/debility: Due to acute illness-SNF on discharge.   Diet: Diet Order            Diet NPO time specified Except for: Sips with Meds  Diet effective now                  Code Status: DNR  Family Communication: None at bedside   Disposition Plan: Home vs Home with Home health vs SNF when ready for discharge  Status is: Inpatient  Remains inpatient appropriate because:Inpatient level of care appropriate due to severity of illness   Dispo: The patient is from: Home              Anticipated d/c is to: Home              Patient currently is not  medically stable to d/c.   Difficult to place patient No   Barriers to Discharge: Fungemia-on Eraxis-TEE scheduled-needs SNF  subsequently  Antimicrobial agents: Anti-infectives (From admission, onward)   Start     Dose/Rate Route Frequency Ordered Stop   08/20/20 1000  cefdinir (OMNICEF) capsule 300 mg  Status:  Discontinued        300 mg Oral Daily 08/19/20 0918 08/19/20 0936   08/20/20 1000  cefdinir (OMNICEF) capsule 300 mg        300 mg Oral Daily 08/19/20 0936 08/21/20 0922   08/19/20 1015  azithromycin (ZITHROMAX) tablet 500 mg  Status:  Discontinued        500 mg Oral Daily 08/19/20 0916 08/19/20 0936   08/19/20 1015  azithromycin (ZITHROMAX) tablet 500 mg        500 mg Oral Daily 08/19/20 0936 08/21/20 0921   08/19/20 1000  anidulafungin (ERAXIS) 100 mg in sodium chloride 0.9 % 100 mL IVPB        100 mg 78 mL/hr over 100 Minutes Intravenous Daily 08/18/20 0247 09/01/20 0959   08/18/20 0400  anidulafungin (ERAXIS) 200 mg in sodium chloride 0.9 % 200 mL IVPB        200 mg 78 mL/hr over 200 Minutes Intravenous  Once 08/18/20 0247 08/18/20 0704   08/17/20 0900  sulfamethoxazole-trimethoprim (BACTRIM) 400-80 MG per tablet 1 tablet        1 tablet Oral Once per day on Mon Wed Fri 08/16/20 1430     08/17/20 0800  cefTRIAXone (ROCEPHIN) 2 g in sodium chloride 0.9 % 100 mL IVPB  Status:  Discontinued        2 g 200 mL/hr over 30 Minutes Intravenous Every 24 hours 08/16/20 1430 08/19/20 0918   08/17/20 0800  azithromycin (ZITHROMAX) 500 mg in sodium chloride 0.9 % 250 mL IVPB  Status:  Discontinued        500 mg 250 mL/hr over 60 Minutes Intravenous Every 24 hours 08/16/20 1430 08/19/20 0916       Time spent: 35 minutes-Greater than 50% of this time was spent in counseling, explanation of diagnosis, planning of further management, and coordination of care.  MEDICATIONS: Scheduled Meds: . carvedilol  25 mg Oral BID  . Chlorhexidine Gluconate Cloth  6 each Topical Daily  . insulin aspart  0-9 Units Subcutaneous TID WC  . insulin glargine  10 Units Subcutaneous QHS  . pantoprazole  40 mg Oral BID  . peg  3350 powder  0.5 kit Oral Once  . predniSONE  10 mg Oral Q breakfast  . rOPINIRole  3 mg Oral QHS  . rosuvastatin  20 mg Oral Daily  . sertraline  100 mg Oral QHS  . sodium bicarbonate  1,300 mg Oral BID  . sulfamethoxazole-trimethoprim  1 tablet Oral Once per day on Mon Wed Fri  . tacrolimus  1 mg Oral TID   Continuous Infusions: . sodium chloride 50 mL/hr at 08/24/20 0530  . anidulafungin 100 mg (08/24/20 1051)   PRN Meds:.acetaminophen **OR** acetaminophen, albuterol, calcium carbonate (dosed in mg elemental calcium), camphor-menthol **AND** hydrOXYzine, docusate sodium, feeding supplement (NEPRO CARB STEADY), guaiFENesin, hydrALAZINE, HYDROcodone-acetaminophen, lidocaine, lip balm, ondansetron **OR** ondansetron (ZOFRAN) IV, oxyCODONE, polyethylene glycol, sorbitol, zolpidem   PHYSICAL EXAM: Vital signs: Vitals:   08/23/20 1712 08/23/20 1805 08/23/20 2012 08/24/20 0406  BP: (!) 187/101 122/76 128/86 130/85  Pulse: 76 86 79   Resp:   18 18  Temp:   97.8 F (36.6 C)  97.7 F (36.5 C)  TempSrc:   Oral Oral  SpO2:   95% 95%  Weight:      Height:       Filed Weights   08/16/20 1328 08/20/20 0339 08/21/20 0347  Weight: 57.6 kg 57 kg 58 kg   Body mass index is 24.16 kg/m.   Gen Exam:Alert awake-not in any distress HEENT:atraumatic, normocephalic Chest: B/L clear to auscultation anteriorly CVS:S1S2 regular Abdomen:soft non tender, non distended Extremities:no edema.  Left wrist with some mild swelling-mildly tender.  No overlying erythema. Neurology: Non focal-but with generalized weakness. Skin: no rash  I have personally reviewed following labs and imaging studies  LABORATORY DATA: CBC: Recent Labs  Lab 08/18/20 0232 08/19/20 0158 08/20/20 0030 08/21/20 0018 08/22/20 0045  WBC 4.9 3.0* 3.3* 4.1 4.7  HGB 8.1* 8.4* 8.5* 9.0* 9.3*  HCT 24.2* 26.2* 26.2* 28.1* 29.9*  MCV 86.7 87.0 88.2 88.4 89.3  PLT 132* 141* 128* 140* 148*    Basic Metabolic Panel: Recent  Labs  Lab 08/18/20 0232 08/19/20 0158 08/20/20 0030 08/21/20 0018 08/22/20 0045  NA 132* 135 133* 134* 132*  K 4.2 3.4* 3.7 3.7 4.3  CL 101 101 104 104 104  CO2 23 23 22 24  20*  GLUCOSE 229* 122* 189* 155* 261*  BUN 59* 56* 53* 50* 52*  CREATININE 3.48* 3.44* 3.17* 3.08* 3.01*  CALCIUM 7.7* 8.0* 7.6* 7.9* 7.6*    GFR: Estimated Creatinine Clearance: 13.6 mL/min (A) (by C-G formula based on SCr of 3.01 mg/dL (H)).  Liver Function Tests: Recent Labs  Lab 08/18/20 0232 08/19/20 0158  AST 18 16  ALT 15 16  ALKPHOS 67 62  BILITOT 0.5 0.4  PROT 5.0* 5.0*  ALBUMIN 2.0* 1.9*   No results for input(s): LIPASE, AMYLASE in the last 168 hours. No results for input(s): AMMONIA in the last 168 hours.  Coagulation Profile: No results for input(s): INR, PROTIME in the last 168 hours.  Cardiac Enzymes: No results for input(s): CKTOTAL, CKMB, CKMBINDEX, TROPONINI in the last 168 hours.  BNP (last 3 results) No results for input(s): PROBNP in the last 8760 hours.  Lipid Profile: No results for input(s): CHOL, HDL, LDLCALC, TRIG, CHOLHDL, LDLDIRECT in the last 72 hours.  Thyroid Function Tests: No results for input(s): TSH, T4TOTAL, FREET4, T3FREE, THYROIDAB in the last 72 hours.  Anemia Panel: No results for input(s): VITAMINB12, FOLATE, FERRITIN, TIBC, IRON, RETICCTPCT in the last 72 hours.  Urine analysis: No results found for: COLORURINE, APPEARANCEUR, LABSPEC, PHURINE, GLUCOSEU, HGBUR, BILIRUBINUR, KETONESUR, PROTEINUR, UROBILINOGEN, NITRITE, LEUKOCYTESUR  Sepsis Labs: Lactic Acid, Venous No results found for: LATICACIDVEN  MICROBIOLOGY: Recent Results (from the past 240 hour(s))  Culture, blood (routine x 2) Call MD if unable to obtain prior to antibiotics being given     Status: Abnormal (Preliminary result)   Collection Time: 08/16/20  3:50 PM   Specimen: BLOOD  Result Value Ref Range Status   Specimen Description BLOOD RIGHT ANTECUBITAL  Final   Special  Requests   Final    BOTTLES DRAWN AEROBIC AND ANAEROBIC Blood Culture results may not be optimal due to an inadequate volume of blood received in culture bottles   Culture  Setup Time (A)  Final    YEAST AEROBIC BOTTLE ONLY CRITICAL RESULT CALLED TO, READ BACK BY AND VERIFIED WITH: PHARMD ABBOTT [phone] FCP    Culture (A)  Final    CANDIDA ALBICANS Sent to [place] for further susceptibility testing. Performed at [facility]  Lab, [address], [place], [place] [address]    Report Status PENDING  Incomplete  Blood Culture ID Panel (Reflexed)     Status: Abnormal   Collection Time: 08/16/20  3:50 PM  Result Value Ref Range Status   Enterococcus faecalis NOT DETECTED NOT DETECTED Final   Enterococcus Faecium NOT DETECTED NOT DETECTED Final   Listeria monocytogenes NOT DETECTED NOT DETECTED Final   Staphylococcus species NOT DETECTED NOT DETECTED Final   Staphylococcus aureus (BCID) NOT DETECTED NOT DETECTED Final   Staphylococcus epidermidis NOT DETECTED NOT DETECTED Final   Staphylococcus lugdunensis NOT DETECTED NOT DETECTED Final   Streptococcus species NOT DETECTED NOT DETECTED Final   Streptococcus agalactiae NOT DETECTED NOT DETECTED Final   Streptococcus pneumoniae NOT DETECTED NOT DETECTED Final   Streptococcus pyogenes NOT DETECTED NOT DETECTED Final   A.calcoaceticus-baumannii NOT DETECTED NOT DETECTED Final   Bacteroides fragilis NOT DETECTED NOT DETECTED Final   Enterobacterales NOT DETECTED NOT DETECTED Final   Enterobacter cloacae complex NOT DETECTED NOT DETECTED Final   Escherichia coli NOT DETECTED NOT DETECTED Final   Klebsiella aerogenes NOT DETECTED NOT DETECTED Final   Klebsiella oxytoca NOT DETECTED NOT DETECTED Final   Klebsiella pneumoniae NOT DETECTED NOT DETECTED Final   Proteus species NOT DETECTED NOT DETECTED Final   Salmonella species NOT DETECTED NOT DETECTED Final   Serratia marcescens NOT DETECTED NOT DETECTED Final   Haemophilus influenzae  NOT DETECTED NOT DETECTED Final   Neisseria meningitidis NOT DETECTED NOT DETECTED Final   Pseudomonas aeruginosa NOT DETECTED NOT DETECTED Final   Stenotrophomonas maltophilia NOT DETECTED NOT DETECTED Final   Candida albicans DETECTED (A) NOT DETECTED Final    Comment: CRITICAL RESULT CALLED TO, READ BACK BY AND VERIFIED WITH: PHARMD ABBOTT [phone] FCP    Candida auris NOT DETECTED NOT DETECTED Final   Candida glabrata NOT DETECTED NOT DETECTED Final   Candida krusei NOT DETECTED NOT DETECTED Final   Candida parapsilosis NOT DETECTED NOT DETECTED Final   Candida tropicalis NOT DETECTED NOT DETECTED Final   Cryptococcus neoformans/gattii NOT DETECTED NOT DETECTED Final    Comment: Performed at [facility] Lab, [address] [place], [place] [address]  Antifungal AST 9 Drug Panel     Status: None (Preliminary result)   Collection Time: 08/16/20  3:50 PM  Result Value Ref Range Status   Organism ID, Yeast Preliminary report  Final    Comment: (NOTE) Specimen has been received and testing has been initiated. Performed At: [facility] [place], [place] [id number] Rush Farmer MD EX:[id number]    Amphotericin B MIC PENDING  Incomplete   Please Note: PENDING  Incomplete   Anidulafungin MIC PENDING  Incomplete   Caspofungin MIC PENDING  Incomplete   Micafungin MIC PENDING  Incomplete   Posaconazole MIC PENDING  Incomplete   Please Note: PENDING  Incomplete   Fluconazole Islt MIC PENDING  Incomplete   Flucytosine MIC PENDING  Incomplete   Itraconazole MIC PENDING  Incomplete   Voriconazole MIC PENDING  Incomplete   Source CANDIDA ALBICANS/ BLOOD  Final    Comment: Performed at [facility], [place]. [address] [place], [place] [address]  Culture, blood (routine x 2) Call MD if unable to obtain prior to antibiotics being given     Status: Abnormal   Collection Time: 08/16/20  3:52 PM   Specimen: BLOOD LEFT ARM  Result Value Ref Range Status    Specimen Description BLOOD LEFT ARM  Final   Special Requests   Final    BOTTLES DRAWN AEROBIC AND ANAEROBIC Blood Culture results may not be optimal due to an inadequate volume of blood received in culture bottles   Culture  Setup Time (A)  Final    YEAST AEROBIC BOTTLE ONLY CRITICAL VALUE NOTED.  VALUE IS CONSISTENT WITH PREVIOUSLY REPORTED AND CALLED VALUE. Performed at [facility], [place] [address], [place], [place] [address]    Culture CANDIDA ALBICANS (A)  Final   Report Status 08/21/2020 FINAL  Final  Culture, blood (routine x 2)     Status: None   Collection Time: 08/18/20  7:51 AM   Specimen: BLOOD  Result Value Ref Range Status   Specimen Description BLOOD RIGHT ANTECUBITAL  Final   Special Requests   Final    BOTTLES DRAWN AEROBIC AND ANAEROBIC Blood Culture adequate volume   Culture   Final    NO GROWTH 5 DAYS Performed at [facility], [place] [address], [place], [place] [address]    Report Status 08/23/2020 FINAL  Final  Culture, blood (routine x 2)     Status: None   Collection Time: 08/18/20  8:03 AM   Specimen: BLOOD LEFT WRIST  Result Value Ref Range Status   Specimen Description BLOOD LEFT WRIST  Final   Special Requests   Final    BOTTLES DRAWN AEROBIC AND ANAEROBIC Blood Culture adequate volume   Culture   Final    NO GROWTH 5 DAYS Performed at [facility], [place] [address], [place], [place] [address]    Report Status 08/23/2020 FINAL  Final    RADIOLOGY STUDIES/RESULTS: CT ABDOMEN PELVIS WO CONTRAST  Result Date: 08/24/2020 CLINICAL DATA:  Infectious gastroenteritis, ascending colitis EXAM: CT ABDOMEN AND PELVIS WITHOUT CONTRAST TECHNIQUE: Multidetector CT imaging of the abdomen and pelvis was performed following the standard protocol without IV contrast. COMPARISON:  None. FINDINGS: Lower chest: Small bilateral pleural effusions are present with mild associated bibasilar compressive atelectasis. Large hiatal hernia. Hypoattenuation of the  cardiac blood pool is in keeping with at least mild anemia. Global cardiac size within normal limits. Hepatobiliary: No focal liver abnormality is seen. No gallstones, gallbladder wall thickening, or biliary dilatation. Pancreas: Unremarkable Spleen: Borderline splenomegaly. No intrasplenic lesions identified. The splenic vein appears unremarkable on this noncontrast examination. Small splenule seen adjacent to the inferior pole of the spleen. Adrenals/Urinary Tract: The adrenal glands are unremarkable. The kidneys are enlarged and contain innumerable cortical cysts in keeping with autosomal dominant polycystic kidney disease. Numerous cysts demonstrate relative hyperdensity likely related to proteinaceous or hemorrhagic cysts. Several small nonobstructing calculi are noted within the lower pole of the right kidney measuring up to 4 mm. No hydronephrosis. No ureteral calculi. The bladder is unremarkable. Stomach/Bowel: Stomach, small bowel, and large bowel are unremarkable save for mild sigmoid diverticulosis. The appendix is absent. An endoscopic clip is seen within the lumen of the cecum. Mild ascites. No free intraperitoneal gas Vascular/Lymphatic: No significant vascular findings are present. No enlarged abdominal or pelvic lymph nodes. Reproductive: Uterus and bilateral adnexa are unremarkable. Other: No abdominal wall hernia. Rectum unremarkable. There is moderate bilateral subcutaneous edema within the flanks and visualized lower extremities in keeping with at least mild anasarca. Musculoskeletal: Remote right inferior pubic ramus fracture. No acute bone abnormality. No lytic or blastic bone lesion. Degenerative changes are seen within the lumbar spine with grade 1 anterolisthesis of L5 upon S1. IMPRESSION: Endoscopic clip noted within the lumen  of the cecum. The bowel, and specifically the colon, however, appears unremarkable and there is no evidence of perforation or focal inflammation. Autosomal dominant  polycystic kidney disease. Minimal right nonobstructing nephrolithiasis. No hydronephrosis. No urolithiasis. Mild anasarca with small bilateral pleural effusions, mild ascites, and subcutaneous body wall edema. Electronically Signed   By: Fidela Salisbury MD   On: 08/24/2020 05:35     LOS: 7 days   Oren Binet, MD  [facility]    To contact the attending provider between 7A-7P or the covering provider during after hours 7P-7A, please log into the web site [email or web address] and access using universal [place] password for that web site. If you do not have the password, please call the hospital operator.  08/24/2020, 11:43 AM

## 2020-08-24 NOTE — Anesthesia Procedure Notes (Signed)
Procedure Name: MAC Date/Time: 08/24/2020 12:40 PM Performed by: Reece Agar, CRNA Pre-anesthesia Checklist: Patient identified, Emergency Drugs available, Suction available and Patient being monitored Patient Re-evaluated:Patient Re-evaluated prior to induction Oxygen Delivery Method: Nasal cannula

## 2020-08-24 NOTE — Consult Note (Signed)
Ref: [name], MD   Subjective:  Here for TEE. H/O fungal infection, r/o endocarditis in 75 years old female with immunocompramise with renal transplant..  Objective:  Vital Signs in the last 24 hours: Temp:  [97.7 F (36.5 C)-98.7 F (37.1 C)] 98.7 F (37.1 C) (04/13 1205) Pulse Rate:  [76-86] 76 (04/13 1205) Resp:  [12-18] 12 (04/13 1205) BP: (122-187)/(76-101) 179/96 (04/13 1205) SpO2:  [95 %] 95 % (04/13 1205) Weight:  [58 kg] 58 kg (04/13 1205)  Physical Exam: BP Readings from Last 1 Encounters:  08/24/20 (!) 179/96     Wt Readings from Last 1 Encounters:  08/24/20 58 kg    Weight change:  Body mass index is 24.16 kg/m. HEENT: [place]/AT, Eyes-Hazel, Conjunctiva-Pale pink, Sclera-Non-icteric Neck: No JVD, No bruit, Trachea midline. Lungs:  Clearing, Bilateral. Cardiac:  Regular rhythm, normal S1 and S2, no S3. III/VI systolic murmur. Abdomen:  Soft, non-tender. BS present. Extremities:  Trace edema present. No cyanosis. No clubbing. Right wrist splint is present. CNS: AxOx2, Cranial nerves grossly intact, moves all 4 extremities.  Skin: Warm and dry.   Intake/Output from previous day: 04/12 0701 - 04/13 0700 In: 1077.5 [P.O.:480; I.V.:497.5; IV Piggyback:100] Out: 1220 [Urine:1200; Blood:20]    Lab Results: BMET    Component Value Date/Time   NA 132 (L) 08/22/2020 0045   NA 134 (L) 08/21/2020 0018   NA 133 (L) 08/20/2020 0030   NA 138 07/20/2020 0000   NA 133 (A) 06/22/2020 0000   NA 141 05/25/2020 0000   K 4.3 08/22/2020 0045   K 3.7 08/21/2020 0018   K 3.7 08/20/2020 0030   CL 104 08/22/2020 0045   CL 104 08/21/2020 0018   CL 104 08/20/2020 0030   CO2 20 (L) 08/22/2020 0045   CO2 24 08/21/2020 0018   CO2 22 08/20/2020 0030   GLUCOSE 261 (H) 08/22/2020 0045   GLUCOSE 155 (H) 08/21/2020 0018   GLUCOSE 189 (H) 08/20/2020 0030   BUN 52 (H) 08/22/2020 0045   BUN 50 (H) 08/21/2020 0018   BUN 53 (H) 08/20/2020 0030   BUN 41 (A) 07/20/2020 0000    BUN 57 (A) 06/22/2020 0000   BUN 54 (A) 05/25/2020 0000   CREATININE 3.01 (H) 08/22/2020 0045   CREATININE 3.08 (H) 08/21/2020 0018   CREATININE 3.17 (H) 08/20/2020 0030   CALCIUM 7.6 (L) 08/22/2020 0045   CALCIUM 7.9 (L) 08/21/2020 0018   CALCIUM 7.6 (L) 08/20/2020 0030   GFRNONAA 16 (L) 08/22/2020 0045   GFRNONAA 15 (L) 08/21/2020 0018   GFRNONAA 15 (L) 08/20/2020 0030   CBC    Component Value Date/Time   WBC 4.7 08/22/2020 0045   RBC 3.35 (L) 08/22/2020 0045   HGB 9.3 (L) 08/22/2020 0045   HCT 29.9 (L) 08/22/2020 0045   PLT 148 (L) 08/22/2020 0045   MCV 89.3 08/22/2020 0045   MCV 90 06/22/2020 0000   MCH 27.8 08/22/2020 0045   MCHC 31.1 08/22/2020 0045   RDW 17.7 (H) 08/22/2020 0045   LYMPHSABS 0.5 (L) 08/16/2020 1556   MONOABS 0.2 08/16/2020 1556   EOSABS 0.1 08/16/2020 1556   BASOSABS 0.0 08/16/2020 1556   HEPATIC Function Panel Recent Labs    08/16/20 1556 08/18/20 0232 08/19/20 0158  PROT 6.0* 5.0* 5.0*   HEMOGLOBIN A1C No components found for: HGA1C,  MPG CARDIAC ENZYMES No results found for: CKTOTAL, CKMB, CKMBINDEX, TROPONINI BNP No results for input(s): PROBNP in the last 8760 hours. TSH Recent Labs  08/16/20 1556  TSH 2.903   CHOLESTEROL No results for input(s): CHOL in the last 8760 hours.  Scheduled Meds: . [MAR Hold] carvedilol  25 mg Oral BID  . [MAR Hold] Chlorhexidine Gluconate Cloth  6 each Topical Daily  . [MAR Hold] insulin aspart  0-9 Units Subcutaneous TID WC  . [MAR Hold] insulin glargine  10 Units Subcutaneous QHS  . [MAR Hold] pantoprazole  40 mg Oral BID  . [MAR Hold] peg 3350 powder  0.5 kit Oral Once  . [MAR Hold] predniSONE  10 mg Oral Q breakfast  . [MAR Hold] rOPINIRole  3 mg Oral QHS  . [MAR Hold] rosuvastatin  20 mg Oral Daily  . [MAR Hold] sertraline  100 mg Oral QHS  . [MAR Hold] sodium bicarbonate  1,300 mg Oral BID  . [MAR Hold] sulfamethoxazole-trimethoprim  1 tablet Oral Once per day on Mon Wed Fri  . [MAR  Hold] tacrolimus  1 mg Oral TID   Continuous Infusions: . sodium chloride 50 mL/hr at 08/24/20 1217  . [MAR Hold] anidulafungin 100 mg (08/24/20 1051)   PRN Meds:.[MAR Hold] acetaminophen **OR** [MAR Hold] acetaminophen, [MAR Hold] albuterol, [MAR Hold] calcium carbonate (dosed in mg elemental calcium), [MAR Hold] camphor-menthol **AND** [MAR Hold] hydrOXYzine, [MAR Hold] docusate sodium, [MAR Hold] feeding supplement (NEPRO CARB STEADY), [MAR Hold] guaiFENesin, [MAR Hold] hydrALAZINE, [MAR Hold] HYDROcodone-acetaminophen, lidocaine, [MAR Hold] lip balm, [MAR Hold] ondansetron **OR** [MAR Hold] ondansetron (ZOFRAN) IV, [MAR Hold] oxyCODONE, [MAR Hold] polyethylene glycol, [MAR Hold] sorbitol, [MAR Hold] zolpidem  Assessment/Plan: Fungemia R/O endocarditis S/P encephalopathy Pneumonia with h/o COVID infection Upper GI bleed Anemia of blood loss CKD, IV S/P renal transplant Type 2 DM HTN HLD Right wrist pain inflammation  TEE today. Patient and husband understood procedure, need and risks and wants me to proceed.   LOS: 7 days   Time spent including chart review, lab review, examination, discussion with patient/Husband/Doctor/Nurse : 30 min   Dixie Dials  MD  08/24/2020, 12:38 PM

## 2020-08-24 NOTE — Progress Notes (Signed)
OT Cancellation Note  Patient Details Name: Ruth Riley MRN: [id number] DOB: 16-Feb-1946   Cancelled Treatment:    Reason Eval/Treat Not Completed: Patient at procedure or test/ unavailable pt currently off the unit for TEE. OT will return as time allows and pt is appropriate.   [facility] OTR/L Acute Rehabilitation Services Office: [place] 08/24/2020, 1:37 PM

## 2020-08-24 NOTE — Anesthesia Preprocedure Evaluation (Addendum)
Anesthesia Evaluation  Patient identified by MRN, date of birth, ID band Patient awake    Reviewed: Allergy & Precautions, NPO status , Patient's Chart, lab work & pertinent test results  Airway Mallampati: III  TM Distance: >3 FB Neck ROM: Full    Dental  (+) Dental Advisory Given, Teeth Intact   Pulmonary neg pulmonary ROS,    breath sounds clear to auscultation       Cardiovascular hypertension, Pt. on home beta blockers  Rhythm:Regular Rate:Normal     Neuro/Psych negative neurological ROS  negative psych ROS   GI/Hepatic Neg liver ROS, GERD  Medicated,  Endo/Other  diabetes, Type 2  Renal/GU CRFRenal disease     Musculoskeletal negative musculoskeletal ROS (+)   Abdominal Normal abdominal exam  (+)   Peds  Hematology negative hematology ROS (+)   Anesthesia Other Findings   Reproductive/Obstetrics                            Anesthesia Physical Anesthesia Plan  ASA: III  Anesthesia Plan: MAC   Post-op Pain Management:    Induction: Intravenous  PONV Risk Score and Plan: 0 and Propofol infusion  Airway Management Planned: Natural Airway  Additional Equipment: None  Intra-op Plan:   Post-operative Plan:   Informed Consent: I have reviewed the patients History and Physical, chart, labs and discussed the procedure including the risks, benefits and alternatives for the proposed anesthesia with the patient or authorized representative who has indicated his/her understanding and acceptance.   Patient has DNR.  Discussed DNR with patient and Suspend DNR.     Plan Discussed with: CRNA  Anesthesia Plan Comments: (Echo: 1. Left ventricular ejection fraction, by estimation, is 70 to 75%. The  left ventricle has hyperdynamic function. The left ventricle has no  regional wall motion abnormalities. Left ventricular diastolic parameters  are consistent with Grade II diastolic   dysfunction (pseudonormalization). Elevated left atrial pressure.  Hyperdynamic LV function with mid cavitary/LVOT gradient up to 23 mmHg  2. Right ventricular systolic function is normal. The right ventricular  size is normal. There is mildly elevated pulmonary artery systolic  pressure. The estimated right ventricular systolic pressure is 79.4 mmHg.  3. Right atrial size was mildly dilated.  4. The mitral valve is normal in structure. No evidence of mitral valve  regurgitation.  5. The aortic valve was not well visualized. Aortic valve regurgitation  is trivial. No aortic stenosis is present.  6. The inferior vena cava is normal in size with greater than 50%  respiratory variability, suggesting right atrial pressure of 3 mmHg. )       Anesthesia Quick Evaluation

## 2020-08-24 NOTE — Progress Notes (Signed)
[place] for Infectious Disease    Date of Admission:  08/16/2020   Total days of antibiotics 7           ID: Ruth Riley is a 75 y.o. female with renal transplant admitted for GI bleed and fevers, found to have candidemia.  Principal Problem:   Acute metabolic encephalopathy Active Problems:   Anemia in chronic kidney disease   Diabetes mellitus type 2 in nonobese [facility])   Essential hypertension   H/O kidney transplant   Stage 4 chronic kidney disease (HCC)   Bilateral wrist pain   Multifocal pneumonia   History of COVID-19   Chronic back pain   Blood in the stool   Heme positive stool   Acute on chronic anemia   Inflammation of colonic mucosa   Angiodysplasia of stomach with hemorrhage    Subjective: She is hungry but realizes she can't eat until her TEE is completed. She denies abdominal pain. Left arm still sore (From injury)  Medications:  . carvedilol  25 mg Oral BID  . Chlorhexidine Gluconate Cloth  6 each Topical Daily  . hydrALAZINE  5 mg Intravenous Once  . insulin aspart  0-9 Units Subcutaneous TID WC  . insulin glargine  10 Units Subcutaneous QHS  . pantoprazole  40 mg Oral BID  . peg 3350 powder  0.5 kit Oral Once  . predniSONE  10 mg Oral Q breakfast  . rOPINIRole  3 mg Oral QHS  . rosuvastatin  20 mg Oral Daily  . sertraline  100 mg Oral QHS  . sodium bicarbonate  1,300 mg Oral BID  . sulfamethoxazole-trimethoprim  1 tablet Oral Once per day on Mon Wed Fri  . tacrolimus  1 mg Oral TID    Objective: Vital signs in last 24 hours: Temp:  [97.3 F (36.3 C)-98.8 F (37.1 C)] 97.3 F (36.3 C) (04/13 1447) Pulse Rate:  [65-86] 82 (04/13 1447) Resp:  [10-18] 14 (04/13 1447) BP: (122-204)/(71-102) 132/71 (04/13 1447) SpO2:  [93 %-98 %] 93 % (04/13 1447) Weight:  [58 kg] 58 kg (04/13 1205) Physical Exam  Constitutional:  oriented to person, place, and time. appears frail and well-nourished. No distress.  HENT: [place]/AT, PERRLA, no  scleral icterus Mouth/Throat: Oropharynx is clear and moist. No oropharyngeal exudate.  Cardiovascular: Normal rate, regular rhythm and normal heart sounds. Exam reveals no gallop and no friction rub.  No murmur heard.  Pulmonary/Chest: Effort normal and breath sounds normal. No respiratory distress.  has no wheezes.  Neck = supple, no nuchal rigidity Abdominal: Soft. Bowel sounds are normal.  exhibits no distension. There is no tenderness.  Lymphadenopathy: no cervical adenopathy. No axillary adenopathy Neurological: alert and oriented to person, place, and time.  Skin: Skin is warm and dry. No rash noted. No erythema.  Psychiatric: a normal mood and affect.  behavior is normal.    Lab Results Recent Labs    08/22/20 0045  WBC 4.7  HGB 9.3*  HCT 29.9*  NA 132*  K 4.3  CL 104  CO2 20*  BUN 52*  CREATININE 3.01*    Microbiology: 4/5 blood cx c.albicans 4/7 blood cx NGTD Studies/Results: CT ABDOMEN PELVIS WO CONTRAST  Result Date: 08/24/2020 CLINICAL DATA:  Infectious gastroenteritis, ascending colitis EXAM: CT ABDOMEN AND PELVIS WITHOUT CONTRAST TECHNIQUE: Multidetector CT imaging of the abdomen and pelvis was performed following the standard protocol without IV contrast. COMPARISON:  None. FINDINGS: Lower chest: Small bilateral pleural effusions are present with  mild associated bibasilar compressive atelectasis. Large hiatal hernia. Hypoattenuation of the cardiac blood pool is in keeping with at least mild anemia. Global cardiac size within normal limits. Hepatobiliary: No focal liver abnormality is seen. No gallstones, gallbladder wall thickening, or biliary dilatation. Pancreas: Unremarkable Spleen: Borderline splenomegaly. No intrasplenic lesions identified. The splenic vein appears unremarkable on this noncontrast examination. Small splenule seen adjacent to the inferior pole of the spleen. Adrenals/Urinary Tract: The adrenal glands are unremarkable. The kidneys are enlarged and  contain innumerable cortical cysts in keeping with autosomal dominant polycystic kidney disease. Numerous cysts demonstrate relative hyperdensity likely related to proteinaceous or hemorrhagic cysts. Several small nonobstructing calculi are noted within the lower pole of the right kidney measuring up to 4 mm. No hydronephrosis. No ureteral calculi. The bladder is unremarkable. Stomach/Bowel: Stomach, small bowel, and large bowel are unremarkable save for mild sigmoid diverticulosis. The appendix is absent. An endoscopic clip is seen within the lumen of the cecum. Mild ascites. No free intraperitoneal gas Vascular/Lymphatic: No significant vascular findings are present. No enlarged abdominal or pelvic lymph nodes. Reproductive: Uterus and bilateral adnexa are unremarkable. Other: No abdominal wall hernia. Rectum unremarkable. There is moderate bilateral subcutaneous edema within the flanks and visualized lower extremities in keeping with at least mild anasarca. Musculoskeletal: Remote right inferior pubic ramus fracture. No acute bone abnormality. No lytic or blastic bone lesion. Degenerative changes are seen within the lumbar spine with grade 1 anterolisthesis of L5 upon S1. IMPRESSION: Endoscopic clip noted within the lumen of the cecum. The bowel, and specifically the colon, however, appears unremarkable and there is no evidence of perforation or focal inflammation. Autosomal dominant polycystic kidney disease. Minimal right nonobstructing nephrolithiasis. No hydronephrosis. No urolithiasis. Mild anasarca with small bilateral pleural effusions, mild ascites, and subcutaneous body wall edema. Electronically Signed   By: Fidela Salisbury MD   On: 08/24/2020 05:35     Assessment/Plan: Candidemia = thought to be due to GI source, underwent TEE today finding 3 valve involvement and mural thrombus. Discussed with dr ghimire about removal of subclavian line. Given her multiple health problems unlikely to be a surgical  candidate. May discuss with CT that they agree with medical management. She will not be a candidate for chronic suppression due to azole and tacrolimus, consider extending anidulafungin course  Will increase anidulafungin dose, minimum of 6 wk of treatment.  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

## 2020-08-24 NOTE — Progress Notes (Signed)
Daily Rounding Note  08/24/2020, 9:58 AM  LOS: 7 days   SUBJECTIVE:   Chief complaint:   Acute on chronic anemia.  FOBT positive.  Feels okay.  N.p.o. for planned TEE. No bowel movement since the bowel prep.  No nausea.  No abdominal pain.  OBJECTIVE:         Vital signs in last 24 hours:    Temp:  [97.5 F (36.4 C)-97.8 F (36.6 C)] 97.7 F (36.5 C) (04/13 0406) Pulse Rate:  [74-86] 79 (04/12 2012) Resp:  [16-21] 18 (04/13 0406) BP: (122-224)/(76-104) 130/85 (04/13 0406) SpO2:  [94 %-96 %] 95 % (04/13 0406) Last BM Date: 08/22/20 Filed Weights   08/16/20 1328 08/20/20 0339 08/21/20 0347  Weight: 57.6 kg 57 kg 58 kg   General: Looks chronically unwell and somewhat acutely ill.  Tired/exhausted Heart: RRR Chest: No labored breathing or cough Abdomen: Soft.  Not tender.  Obese.  Not distended.  Active bowel sounds. Extremities: Nonpitting upper and lower extremity edema. Neuro/Psych: Drowsy but awakens easily.  Affect blunted.  No tremors.  Intake/Output from previous day: 04/12 0701 - 04/13 0700 In: 1077.5 [P.O.:480; I.V.:497.5; IV Piggyback:100] Out: 1220 [Urine:1200; Blood:20]  Intake/Output this shift: No intake/output data recorded.  Lab Results: Recent Labs    08/22/20 0045  WBC 4.7  HGB 9.3*  HCT 29.9*  PLT 148*   BMET Recent Labs    08/22/20 0045  NA 132*  K 4.3  CL 104  CO2 20*  GLUCOSE 261*  BUN 52*  CREATININE 3.01*  CALCIUM 7.6*   LFT No results for input(s): PROT, ALBUMIN, AST, ALT, ALKPHOS, BILITOT, BILIDIR, IBILI in the last 72 hours. PT/INR No results for input(s): LABPROT, INR in the last 72 hours. Hepatitis Panel No results for input(s): HEPBSAG, HCVAB, HEPAIGM, HEPBIGM in the last 72 hours.  Studies/Results: CT ABDOMEN PELVIS WO CONTRAST  Result Date: 08/24/2020 CLINICAL DATA:  Infectious gastroenteritis, ascending colitis EXAM: CT ABDOMEN AND PELVIS WITHOUT CONTRAST  TECHNIQUE: Multidetector CT imaging of the abdomen and pelvis was performed following the standard protocol without IV contrast. COMPARISON:  None. FINDINGS: Lower chest: Small bilateral pleural effusions are present with mild associated bibasilar compressive atelectasis. Large hiatal hernia. Hypoattenuation of the cardiac blood pool is in keeping with at least mild anemia. Global cardiac size within normal limits. Hepatobiliary: No focal liver abnormality is seen. No gallstones, gallbladder wall thickening, or biliary dilatation. Pancreas: Unremarkable Spleen: Borderline splenomegaly. No intrasplenic lesions identified. The splenic vein appears unremarkable on this noncontrast examination. Small splenule seen adjacent to the inferior pole of the spleen. Adrenals/Urinary Tract: The adrenal glands are unremarkable. The kidneys are enlarged and contain innumerable cortical cysts in keeping with autosomal dominant polycystic kidney disease. Numerous cysts demonstrate relative hyperdensity likely related to proteinaceous or hemorrhagic cysts. Several small nonobstructing calculi are noted within the lower pole of the right kidney measuring up to 4 mm. No hydronephrosis. No ureteral calculi. The bladder is unremarkable. Stomach/Bowel: Stomach, small bowel, and large bowel are unremarkable save for mild sigmoid diverticulosis. The appendix is absent. An endoscopic clip is seen within the lumen of the cecum. Mild ascites. No free intraperitoneal gas Vascular/Lymphatic: No significant vascular findings are present. No enlarged abdominal or pelvic lymph nodes. Reproductive: Uterus and bilateral adnexa are unremarkable. Other: No abdominal wall hernia. Rectum unremarkable. There is moderate bilateral subcutaneous edema within the flanks and visualized lower extremities in keeping with at least mild anasarca. Musculoskeletal:  Remote right inferior pubic ramus fracture. No acute bone abnormality. No lytic or blastic bone  lesion. Degenerative changes are seen within the lumbar spine with grade 1 anterolisthesis of L5 upon S1. IMPRESSION: Endoscopic clip noted within the lumen of the cecum. The bowel, and specifically the colon, however, appears unremarkable and there is no evidence of perforation or focal inflammation. Autosomal dominant polycystic kidney disease. Minimal right nonobstructing nephrolithiasis. No hydronephrosis. No urolithiasis. Mild anasarca with small bilateral pleural effusions, mild ascites, and subcutaneous body wall edema. Electronically Signed   By: Fidela Salisbury MD   On: 08/24/2020 05:35   IR Fluoro Guide CV Line Right  Result Date: 08/22/2020 INDICATION: 75 year old female referred for tunneled cuffed central venous catheter EXAM: IMAGE GUIDED PLACEMENT OF TUNNELED CUFFED CENTRAL VENOUS CATHETER MEDICATIONS: None ANESTHESIA/SEDATION: None FLUOROSCOPY TIME:  Fluoroscopy Time: 0 minutes 6 seconds (0 mGy). COMPLICATIONS: None PROCEDURE: Informed written consent was obtained from the patient after a thorough discussion of the procedural risks, benefits and alternatives. All questions were addressed. Maximal Sterile Barrier Technique was utilized including caps, mask, sterile gowns, sterile gloves, sterile drape, hand hygiene and skin antiseptic. A timeout was performed prior to the initiation of the procedure. After written informed consent was obtained, patient was placed in the supine position on angiographic table. Patency of the right internal jugular vein was confirmed with ultrasound with image documentation. Patient was prepped and draped in the usual sterile fashion including the right neck and right superior chest. Using ultrasound guidance, the skin and subcutaneous tissues overlying the right internal jugular vein were generously infiltrated with 1% lidocaine without epinephrine. Using ultrasound guidance, the right internal jugular vein was punctured with a micropuncture needle, and an 018 wire  was advanced into the right heart confirming venous access. A small stab incision was made with an 11 blade scalpel. Peel-away sheath was placed over the wire, and then the wire was removed, marking the wire for estimation of internal catheter length. The chest wall was then generously infiltrated with 1% lidocaine for local anesthesia along the tissue tract. Small stab incision was made with 11 blade scalpel, and then the catheter was back tunneled to the puncture site at the right internal jugular vein. Catheter was pulled through the tract, with the catheter amputated at 18 cm. Catheter was advanced through the peel-away sheath, and the peel-away sheath was removed. Final image was stored. The catheter was anchored to the chest wall with 2 retention sutures, and Derma bond was used to seal the right internal jugular vein incision site and at the right chest wall. Patient tolerated the procedure well and remained hemodynamically stable throughout. No complications were encountered and no significant blood loss was encountered. IMPRESSION: Status post placement of right IJ tunneled cuffed central venous catheter. Signed, Dulcy Fanny. Dellia Nims, RPVI [facility] Electronically Signed   By: Corrie Mckusick D.O.   On: 08/22/2020 11:51   IR US Guide Vasc Access Right  Result Date: 08/22/2020 INDICATION: 75 year old female referred for tunneled cuffed central venous catheter EXAM: IMAGE GUIDED PLACEMENT OF TUNNELED CUFFED CENTRAL VENOUS CATHETER MEDICATIONS: None ANESTHESIA/SEDATION: None FLUOROSCOPY TIME:  Fluoroscopy Time: 0 minutes 6 seconds (0 mGy). COMPLICATIONS: None PROCEDURE: Informed written consent was obtained from the patient after a thorough discussion of the procedural risks, benefits and alternatives. All questions were addressed. Maximal Sterile Barrier Technique was utilized including caps, mask, sterile gowns, sterile gloves, sterile drape, hand  hygiene and skin antiseptic. A timeout was  performed prior to the initiation of the procedure. After written informed consent was obtained, patient was placed in the supine position on angiographic table. Patency of the right internal jugular vein was confirmed with ultrasound with image documentation. Patient was prepped and draped in the usual sterile fashion including the right neck and right superior chest. Using ultrasound guidance, the skin and subcutaneous tissues overlying the right internal jugular vein were generously infiltrated with 1% lidocaine without epinephrine. Using ultrasound guidance, the right internal jugular vein was punctured with a micropuncture needle, and an 018 wire was advanced into the right heart confirming venous access. A small stab incision was made with an 11 blade scalpel. Peel-away sheath was placed over the wire, and then the wire was removed, marking the wire for estimation of internal catheter length. The chest wall was then generously infiltrated with 1% lidocaine for local anesthesia along the tissue tract. Small stab incision was made with 11 blade scalpel, and then the catheter was back tunneled to the puncture site at the right internal jugular vein. Catheter was pulled through the tract, with the catheter amputated at 18 cm. Catheter was advanced through the peel-away sheath, and the peel-away sheath was removed. Final image was stored. The catheter was anchored to the chest wall with 2 retention sutures, and Derma bond was used to seal the right internal jugular vein incision site and at the right chest wall. Patient tolerated the procedure well and remained hemodynamically stable throughout. No complications were encountered and no significant blood loss was encountered. IMPRESSION: Status post placement of right IJ tunneled cuffed central venous catheter. Signed, Dulcy Fanny. Dellia Nims, RPVI [facility]  Electronically Signed   By: Corrie Mckusick D.O.   On: 08/22/2020 11:51    ASSESMENT:   *   Acute on chronic anemia.  FOBT positive. 08/23/2020 colonoscopy:   Nonbleeding hemorrhoids.  Mucosal congestion/edema, friability, adherent blood in the cecum, proximal ascending colon, area biopsied (path: Unremarkable colonic mucosa without inflammation, dysplasia or carcinoma).  Clips placed to achieve hemostasis.  Sigmoid, descending diverticulosis, mild and not bleeding. 08/23/20 EGD: 5 cm HH.  Gastric AVMs, 1 of which was oozing blood and was treated with APC. 08/24/2020 CTAP w/o contrast: Endoclips at the cecum.  Colon unremarkable, no signs of inflammation, focal perforation.  Polycystic kidneys.  Nonobstructing right nephrolithiasis.  No hydronephrosis.  Mild anasarca and small bilateral pleural effusions.  Mild ascites, subcutaneous body wall edema. Hgb 9.3.  PRBC x1 on 08/17/2020  *    Failing renal transplant, stage V CKD.   PLAN   *    Continue Protonix 40 mg po/daily long-term. GI signing off.  Does not need to follow-up with GI in office.  If she does need GI in future her GI is Dr. Virgia [facility].      Ruth Riley  08/24/2020, 9:58 AM Phone [phone]

## 2020-08-24 NOTE — Transfer of Care (Signed)
Immediate Anesthesia Transfer of Care Note  Patient: Ruth Riley  Procedure(s) Performed: TRANSESOPHAGEAL ECHOCARDIOGRAM (TEE) (N/A ) BUBBLE STUDY  Patient Location: Endoscopy Unit  Anesthesia Type:MAC  Level of Consciousness: drowsy  Airway & Oxygen Therapy: Patient Spontanous Breathing and Patient connected to nasal cannula oxygen  Post-op Assessment: Report given to RN and Post -op Vital signs reviewed and stable  Post vital signs: Reviewed and stable  Last Vitals:  Vitals Value Taken Time  BP 144/92 08/24/20 1317  Temp    Pulse 73 08/24/20 1318  Resp 13 08/24/20 1318  SpO2 98 % 08/24/20 1318  Vitals shown include unvalidated device data.  Last Pain:  Vitals:   08/24/20 1205  TempSrc: Oral  PainSc: Asleep      Patients Stated Pain Goal: 5 (21/62/44 6950)  Complications: No complications documented.

## 2020-08-24 NOTE — CV Procedure (Signed)
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  Positive Findings: 1. Normal LV systolic function 2. Mobile small vegetations on MV, TV and AV. 3. Negative ASD or PFO. 4. Linear mobile clot in RA cavity. 5. Mild AI, MR and TR. 6. Small pleural effusion.  Patient tolerated the procedure well. Long term antifungal treatment. Possible anticoagulant use or change in central line.  Full report to follow. Primary notified.  Dixie Dials, MD 08/24/2020 at 1:27 PM

## 2020-08-24 NOTE — Progress Notes (Signed)
ANTICOAGULATION CONSULT NOTE - Initial Consult  Pharmacy Consult for heparin Indication: Right atrial thrombus  Allergies  Allergen Reactions  . Penicillins Hives and Other (See Comments)  . Tramadol Other (See Comments) and Palpitations    Patient reports it makes her feel spaced out Patient reports it makes her feel spaced out Patient reports it makes her feel spaced out Patient reports it makes her feel spaced out   . Donepezil Diarrhea and Other (See Comments)  . Amlodipine Swelling    dizziness    Patient Measurements: Height: 5' 1"  (154.9 cm) Weight: 58 kg (127 lb 13.9 oz) IBW/kg (Calculated) : 47.8 Heparin Dosing Weight: 58 kg   Vital Signs: Temp: 97.3 F (36.3 C) (04/13 1447) Temp Source: Oral (04/13 1447) BP: 132/71 (04/13 1447) Pulse Rate: 82 (04/13 1447)  Labs: Recent Labs    08/22/20 0045  HGB 9.3*  HCT 29.9*  PLT 148*  CREATININE 3.01*    Estimated Creatinine Clearance: 13.6 mL/min (A) (by C-G formula based on SCr of 3.01 mg/dL (H)).   Medical History: Past Medical History:  Diagnosis Date  . Complication of anesthesia    post anestheia nausea  . Diabetes mellitus type 2 in nonobese (HCC)   . Essential hypertension   . H/O kidney transplant   . Stage 4 chronic kidney disease (HCC)     Medications:  Scheduled:  . carvedilol  25 mg Oral BID  . Chlorhexidine Gluconate Cloth  6 each Topical Daily  . hydrALAZINE  5 mg Intravenous Once  . insulin aspart  0-9 Units Subcutaneous TID WC  . insulin glargine  10 Units Subcutaneous QHS  . pantoprazole  40 mg Oral BID  . peg 3350 powder  0.5 kit Oral Once  . predniSONE  10 mg Oral Q breakfast  . rOPINIRole  3 mg Oral QHS  . rosuvastatin  20 mg Oral Daily  . sertraline  100 mg Oral QHS  . sodium bicarbonate  1,300 mg Oral BID  . sulfamethoxazole-trimethoprim  1 tablet Oral Once per day on Mon Wed Fri  . tacrolimus  1 mg Oral TID    Assessment: 68 yof presenting after fall with multiple  orthopedic complaints and worsening anemia, now s/p GI eval finding gastric angiectasia treated with APC. No AC PTA.   Found to have TEE with small vegetations on MV/TV/AV - mobile clot in RA cavity. Hgb 9.3, plt 148. No s/sx of bleeding on last check on 4/11.  Goal of Therapy:  Heparin level 0.3-0.5 units/ml Monitor platelets by anticoagulation protocol: Yes   Plan:  Start heparin infusion at 700 units/hr Check anti-Xa level in 8 hours and daily while on heparin Continue to monitor H&H and platelets  Antonietta Jewel, PharmD, [place] Clinical Pharmacist  Phone: [phone] 08/24/2020 6:55 PM  Please check AMION for all [facility] phone numbers After 10:00 PM, call [place] [phone]

## 2020-08-24 NOTE — Progress Notes (Signed)
  Echocardiogram Echocardiogram Transesophageal has been performed.  Ruth Riley 08/24/2020, 1:31 PM

## 2020-08-24 NOTE — Progress Notes (Addendum)
TEE results discussed with Dr. Kathlyn [place] has small vegetations on MV, TV and AV.  She also has a mobile clot in the RA cavity.  I discussed with GI MD-Dr. Lesli Albee to start heparin-but to watch for bleeding.  Subsequently long discussion with patient at bedside-she then called her  husband-David-and placed him on speaker phone-we had an extensive discussion-risks, benefits, alternatives -of anticoagulation was discussed-patient-husband elected to try anticoagulation with close inpatient monitoring.  We will plan on starting IV heparin-without bolus-and will ask pharmacy to see if this can be maintained at a low therapeutic index.  Will watch patient closely.

## 2020-08-25 ENCOUNTER — Encounter (HOSPITAL_COMMUNITY): Payer: Self-pay | Admitting: Cardiovascular Disease

## 2020-08-25 DIAGNOSIS — Z940 Kidney transplant status: Secondary | ICD-10-CM

## 2020-08-25 DIAGNOSIS — I330 Acute and subacute infective endocarditis: Secondary | ICD-10-CM

## 2020-08-25 DIAGNOSIS — N184 Chronic kidney disease, stage 4 (severe): Secondary | ICD-10-CM

## 2020-08-25 DIAGNOSIS — I351 Nonrheumatic aortic (valve) insufficiency: Secondary | ICD-10-CM

## 2020-08-25 DIAGNOSIS — Z8616 Personal history of COVID-19: Secondary | ICD-10-CM

## 2020-08-25 DIAGNOSIS — J189 Pneumonia, unspecified organism: Secondary | ICD-10-CM | POA: Diagnosis not present

## 2020-08-25 DIAGNOSIS — I361 Nonrheumatic tricuspid (valve) insufficiency: Secondary | ICD-10-CM

## 2020-08-25 DIAGNOSIS — G9341 Metabolic encephalopathy: Secondary | ICD-10-CM | POA: Diagnosis not present

## 2020-08-25 DIAGNOSIS — I339 Acute and subacute endocarditis, unspecified: Secondary | ICD-10-CM

## 2020-08-25 DIAGNOSIS — M545 Low back pain, unspecified: Secondary | ICD-10-CM

## 2020-08-25 DIAGNOSIS — N185 Chronic kidney disease, stage 5: Secondary | ICD-10-CM | POA: Diagnosis not present

## 2020-08-25 DIAGNOSIS — Z992 Dependence on renal dialysis: Secondary | ICD-10-CM

## 2020-08-25 DIAGNOSIS — N186 End stage renal disease: Secondary | ICD-10-CM

## 2020-08-25 DIAGNOSIS — G8929 Other chronic pain: Secondary | ICD-10-CM

## 2020-08-25 DIAGNOSIS — I340 Nonrheumatic mitral (valve) insufficiency: Secondary | ICD-10-CM

## 2020-08-25 LAB — CBC
HCT: 28.8 % — ABNORMAL LOW (ref 36.0–46.0)
Hemoglobin: 9.1 g/dL — ABNORMAL LOW (ref 12.0–15.0)
MCH: 28 pg (ref 26.0–34.0)
MCHC: 31.6 g/dL (ref 30.0–36.0)
MCV: 88.6 fL (ref 80.0–100.0)
Platelets: 184 10*3/uL (ref 150–400)
RBC: 3.25 MIL/uL — ABNORMAL LOW (ref 3.87–5.11)
RDW: 17 % — ABNORMAL HIGH (ref 11.5–15.5)
WBC: 5.4 10*3/uL (ref 4.0–10.5)
nRBC: 0 % (ref 0.0–0.2)

## 2020-08-25 LAB — BASIC METABOLIC PANEL
Anion gap: 8 (ref 5–15)
BUN: 34 mg/dL — ABNORMAL HIGH (ref 8–23)
CO2: 23 mmol/L (ref 22–32)
Calcium: 8.1 mg/dL — ABNORMAL LOW (ref 8.9–10.3)
Chloride: 107 mmol/L (ref 98–111)
Creatinine, Ser: 2.53 mg/dL — ABNORMAL HIGH (ref 0.44–1.00)
GFR, Estimated: 20 mL/min — ABNORMAL LOW (ref >60)
Glucose, Bld: 79 mg/dL (ref 70–99)
Potassium: 3.6 mmol/L (ref 3.5–5.1)
Sodium: 138 mmol/L (ref 135–145)

## 2020-08-25 LAB — LEGIONELLA PNEUMOPHILA SEROGP 1 UR AG: L. pneumophila Serogp 1 Ur Ag: NEGATIVE

## 2020-08-25 LAB — GLUCOSE, CAPILLARY
Glucose-Capillary: 66 mg/dL — ABNORMAL LOW (ref 70–99)
Glucose-Capillary: 187 mg/dL — ABNORMAL HIGH (ref 70–99)
Glucose-Capillary: 195 mg/dL — ABNORMAL HIGH (ref 70–99)
Glucose-Capillary: 155 mg/dL — ABNORMAL HIGH (ref 70–99)

## 2020-08-25 LAB — HEPARIN LEVEL (UNFRACTIONATED)
Heparin Unfractionated: 0.16 IU/mL — ABNORMAL LOW (ref 0.30–0.70)
Heparin Unfractionated: 0.27 IU/mL — ABNORMAL LOW (ref 0.30–0.70)

## 2020-08-25 MED ORDER — HYDRALAZINE HCL 20 MG/ML IJ SOLN
10.0000 mg | Freq: Once | INTRAMUSCULAR | Status: AC
  Administered 2020-08-25: 10 mg via INTRAVENOUS
  Filled 2020-08-25: qty 1

## 2020-08-25 MED ORDER — HYDRALAZINE HCL 25 MG PO TABS
25.0000 mg | ORAL_TABLET | Freq: Three times a day (TID) | ORAL | Status: DC
  Administered 2020-08-25 – 2020-08-29 (×13): 25 mg via ORAL
  Filled 2020-08-25 (×13): qty 1

## 2020-08-25 NOTE — Progress Notes (Signed)
[place] for Heparin  Indication: Right atrial thrombus  Allergies  Allergen Reactions  . Penicillins Hives and Other (See Comments)  . Tramadol Other (See Comments) and Palpitations    Patient reports it makes her feel spaced out Patient reports it makes her feel spaced out Patient reports it makes her feel spaced out Patient reports it makes her feel spaced out   . Donepezil Diarrhea and Other (See Comments)  . Amlodipine Swelling    dizziness    Patient Measurements: Height: 5' 1"  (154.9 cm) Weight: 58 kg (127 lb 13.9 oz) IBW/kg (Calculated) : 47.8 Heparin Dosing Weight: 58 kg   Vital Signs: Temp: 97.5 F (36.4 C) (04/14 0304) Temp Source: Oral (04/14 0304) BP: 161/85 (04/14 0304) Pulse Rate: 68 (04/14 0304)  Labs: Recent Labs    08/25/20 0452  HEPARINUNFRC 0.16*  CREATININE 2.53*    Estimated Creatinine Clearance: 16.2 mL/min (A) (by C-G formula based on SCr of 2.53 mg/dL (H)).   Medical History: Past Medical History:  Diagnosis Date  . Complication of anesthesia    post anestheia nausea  . Diabetes mellitus type 2 in nonobese (HCC)   . Essential hypertension   . H/O kidney transplant   . Stage 4 chronic kidney disease (HCC)     Medications:  Scheduled:  . carvedilol  25 mg Oral BID  . Chlorhexidine Gluconate Cloth  6 each Topical Daily  . insulin aspart  0-9 Units Subcutaneous TID WC  . insulin glargine  10 Units Subcutaneous QHS  . pantoprazole  40 mg Oral BID  . peg 3350 powder  0.5 kit Oral Once  . predniSONE  10 mg Oral Q breakfast  . rOPINIRole  3 mg Oral QHS  . rosuvastatin  20 mg Oral Daily  . sertraline  100 mg Oral QHS  . sodium bicarbonate  1,300 mg Oral BID  . sulfamethoxazole-trimethoprim  1 tablet Oral Once per day on Mon Wed Fri  . 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 No AC PTA.   Found to have TEE with small vegetations on MV/TV/AV - mobile clot in RA cavity. Hgb 9.3, plt 148. No s/sx of bleeding on last check on 4/11.  4/14 AM update:  Heparin level low  No issues per RN  Goal of Therapy:  Heparin level 0.3-0.5 units/ml Monitor platelets by anticoagulation protocol: Yes   Plan:  Inc heparin to 850 units/hr 1400 heparin level  Narda Bonds, PharmD, BCPS Clinical Pharmacist Phone: [phone]

## 2020-08-25 NOTE — Consult Note (Addendum)
[address]       [place] [address]             [phone]        Ruth Riley Record #[id number] Date of Birth: [birth year]  Referring: No ref. provider found Primary Care: [name], MD Primary Cardiologist:No primary care provider on file.  Chief Complaint:  Fever, fall   History of Present Illness:      Ruth Riley is a 75 year old female patient with a past medical history significant for hypertension, history of renal transplant with current stage IV CKD, anemia, diabetes mellitus, chronic back pain, history of COVID-19 with COVID-pneumonia, who had a recent fall and injured her right wrist.  She states that at the beginning of the month she was feeling the best she had felt in a while until on April 5th she fell in the bathroom and hit her hand on the toilet.  She said that earlier that day she had received a shot in her wrist and it made her feel sleepy and woozy and this is what caused her fall.  It was also reported that she had a fever of 101.7 and was incoherent.  It was determined at this time that she had COVID-19 again with pneumonia.  She did have a history of shortness of breath and cough that was consistent with her pneumonia diagnosis.  She also has chronic back pain and has been recently treated with oxycodone, prednisone, lidocaine patch, and Lyrica.  She is a renal transplant recipient but her transplant is failing and nephrology is recommending preemptive graft placement.  Of note, she did have history of a GI bleed and work-up done but currently does not have any dark tarry stools.  She had positive blood cultures on 4/5 with candida albicans. She was started on Eraxis and remains on it. Dr. Doylene Canard performed a echocardiogram yesterday to rule out endocarditis.  Left ventricular ejection fraction was estimated to be 55 to 60%.  The left atrium was moderately dilated, there was a pericardial effusion which was  circumferential but no evidence of cardiac tamponade.  There were multiple small vegetations identified on the mitral valve, aortic valve vegetation, and also a tricuspid valve vegetation.  There was also a possible large linear clot noted on the echocardiogram report.  Due to the thombus noted on Echocardiogram, she was started on heparin gtt. We are being consulted for possible surgical intervention for her aortic valve, mitral valve, and tricuspid valve vegetations.   Current Activity/ Functional Status: Patient was independent with mobility/ambulation, transfers, ADL's, IADL's.   Zubrod Score: At the time of surgery this patient's most appropriate activity status/level should be described as: []     0    Normal activity, no symptoms []     1    Restricted in physical strenuous activity but ambulatory, able to do out light work [x]     2    Ambulatory and capable of self care, unable to do work activities, up and about more than 50%  Of the time                            []     3    Only limited self care, in bed greater than 50% of waking hours []     4    Completely disabled, no self care, confined to bed or chair []   5    Moribund  Past Medical History:  Diagnosis Date  . Complication of anesthesia    post anestheia nausea  . Diabetes mellitus type 2 in nonobese (HCC)   . Essential hypertension   . H/O kidney transplant   . Stage 4 chronic kidney disease [facility])     Past Surgical History:  Procedure Laterality Date  . BIOPSY  08/23/2020   Procedure: BIOPSY;  Surgeon: Jerene Bears, MD;  Location: [place];  Service: Gastroenterology;;  . Kathleen Argue STUDY  08/24/2020   Procedure: BUBBLE STUDY;  Surgeon: Dixie Dials, MD;  Location: [place];  Service: Cardiovascular;;  . COLONOSCOPY N/A 08/23/2020   Procedure: COLONOSCOPY;  Surgeon: Jerene Bears, MD;  Location: [place];  Service: Gastroenterology;  Laterality: N/A;  . ESOPHAGOGASTRODUODENOSCOPY (EGD) WITH PROPOFOL N/A  08/23/2020   Procedure: ESOPHAGOGASTRODUODENOSCOPY (EGD) WITH PROPOFOL;  Surgeon: Jerene Bears, MD;  Location: [facility] ENDOSCOPY;  Service: Gastroenterology;  Laterality: N/A;  . HEMOSTASIS CLIP PLACEMENT  08/23/2020   Procedure: HEMOSTASIS CLIP PLACEMENT;  Surgeon: Jerene Bears, MD;  Location: [place] ENDOSCOPY;  Service: Gastroenterology;;  . HOT HEMOSTASIS N/A 08/23/2020   Procedure: HOT HEMOSTASIS (ARGON PLASMA COAGULATION/BICAP);  Surgeon: Jerene Bears, MD;  Location: [facility] ENDOSCOPY;  Service: Gastroenterology;  Laterality: N/A;  . IR FLUORO GUIDE CV LINE RIGHT  08/22/2020  . IR US GUIDE VASC ACCESS RIGHT  08/22/2020  . TEE WITHOUT CARDIOVERSION N/A 08/24/2020   Procedure: TRANSESOPHAGEAL ECHOCARDIOGRAM (TEE);  Surgeon: Dixie Dials, MD;  Location: [facility] ENDOSCOPY;  Service: Cardiovascular;  Laterality: N/A;    Social History   Tobacco Use  Smoking Status Never Smoker  Smokeless Tobacco Never Used    Social History   Substance and Sexual Activity  Alcohol Use None     Allergies  Allergen Reactions  . Penicillins Hives and Other (See Comments)  . Tramadol Other (See Comments) and Palpitations    Patient reports it makes her feel spaced out Patient reports it makes her feel spaced out Patient reports it makes her feel spaced out Patient reports it makes her feel spaced out   . Donepezil Diarrhea and Other (See Comments)  . Amlodipine Swelling    dizziness    Current Facility-Administered Medications  Medication Dose Route Frequency Provider Last Rate Last Admin  . 0.9 %  sodium chloride infusion   Intravenous Continuous Jonetta Osgood, MD 10 mL/hr at 08/24/20 1450 Rate Change at 08/24/20 1450  . acetaminophen (TYLENOL) tablet 650 mg  650 mg Oral Q6H PRN Karmen Bongo, MD       Or  . acetaminophen (TYLENOL) suppository 650 mg  650 mg Rectal Q6H PRN Karmen Bongo, MD      . albuterol (PROVENTIL) (2.5 MG/3ML) 0.083% nebulizer solution 2.5 mg  2.5 mg Nebulization Q2H PRN Karmen Bongo, MD      . anidulafungin (ERAXIS) 200 mg in sodium chloride 0.9 % 200 mL IVPB  200 mg Intravenous Daily Jonetta Osgood, MD   Stopped at 08/25/20 1335  . calcium carbonate (dosed in mg elemental calcium) suspension 500 mg of elemental calcium  500 mg of elemental calcium Oral Q6H PRN Karmen Bongo, MD      . camphor-menthol [facility]) lotion 1 application  1 application Topical V4B PRN Karmen Bongo, MD       And  . hydrOXYzine (ATARAX/VISTARIL) tablet 25 mg  25 mg Oral Q8H PRN Karmen Bongo, MD      . carvedilol (COREG) tablet 25 mg  25 mg Oral BID Karmen Bongo, MD   25 mg at 08/25/20 0859  . Chlorhexidine Gluconate Cloth 2 % PADS 6 each  6 each Topical Daily Elgergawy, Silver Huguenin, MD   6 each at 08/25/20 0900  . docusate sodium (ENEMEEZ) enema 283 mg  1 enema Rectal PRN Karmen Bongo, MD      . feeding supplement (NEPRO CARB STEADY) liquid 237 mL  237 mL Oral TID PRN Karmen Bongo, MD      . guaiFENesin ([place]) 12 hr tablet 600 mg  600 mg Oral BID PRN Karmen Bongo, MD      . heparin ADULT infusion 100 units/mL (25000 units/259m)  850 Units/hr Intravenous Continuous LErenest Blank RPH 8.5 mL/hr at 08/25/20 0621 850 Units/hr at 08/25/20 07824 . hydrALAZINE (APRESOLINE) injection 5 mg  5 mg Intravenous Q4H PRN YKarmen Bongo MD   5 mg at 08/24/20 1345  . hydrALAZINE (APRESOLINE) tablet 25 mg  25 mg Oral Q8H Ghimire, SHenreitta Leber MD   25 mg at 08/25/20 1328  . HYDROcodone-acetaminophen (NORCO/VICODIN) 5-325 MG per tablet 1-2 tablet  1-2 tablet Oral Q4H PRN YKarmen Bongo MD   2 tablet at 08/25/20 [phone] . insulin aspart (novoLOG) injection 0-9 Units  0-9 Units Subcutaneous TID WC YKarmen Bongo MD   2 Units at 08/25/20 1158  . insulin glargine (LANTUS) injection 10 Units  10 Units Subcutaneous QHS YKarmen Bongo MD   10 Units at 08/24/20 2239  . lidocaine (XYLOCAINE) 1 % (with pres) injection    PRN WCorrie Mckusick DO   10 mL at 08/22/20 1056  . lip balm (CARMEX)  ointment   Topical PRN SCarlyle Basques MD      . ondansetron ([facility] tablet 4 mg  4 mg Oral Q6H PRN YKarmen Bongo MD       Or  . ondansetron ([facility] injection 4 mg  4 mg Intravenous Q6H PRN YKarmen Bongo MD   4 mg at 08/17/20 2010  . oxyCODONE (Oxy IR/ROXICODONE) immediate release tablet 5 mg  5 mg Oral Q4H PRN YKarmen Bongo MD   5 mg at 08/25/20 0905  . pantoprazole (PROTONIX) EC tablet 40 mg  40 mg Oral BID YKarmen Bongo MD   40 mg at 08/25/20 0900  . peg 3350 powder (MOVIPREP) kit 100 g  0.5 kit Oral Once PBatesville Dwayne A, RPH      . polyethylene glycol (MIRALAX / GLYCOLAX) packet 17 g  17 g Oral Daily PRN KCarl BestM, NP      . predniSONE (DELTASONE) tablet 10 mg  10 mg Oral Q breakfast Elgergawy, DSilver Huguenin MD   10 mg at 08/25/20 0900  . rOPINIRole (REQUIP) tablet 3 mg  3 mg Oral QHS YKarmen Bongo MD   3 mg at 08/24/20 2229  . rosuvastatin (CRESTOR) tablet 20 mg  20 mg Oral Daily YKarmen Bongo MD   20 mg at 08/25/20 0900  . sertraline (ZOLOFT) tablet 100 mg  100 mg Oral QHS Chotiner, BYevonne Aline MD   100 mg at 08/24/20 2227  . sodium bicarbonate tablet 1,300 mg  1,300 mg Oral BID YKarmen Bongo MD   1,300 mg at 08/25/20 0859  . sorbitol 70 % solution 30 mL  30 mL Oral PRN YKarmen Bongo MD      . sulfamethoxazole-trimethoprim (BACTRIM) 400-80 MG per tablet 1 tablet  1 tablet Oral Once per day on Mon Wed Fri Yates, Jennifer, MD   1 tablet at 08/24/20 [phone] .  tacrolimus (PROGRAF) capsule 1 mg  1 mg Oral TID Elgergawy, Silver Huguenin, MD   1 mg at 08/25/20 0900  . zolpidem (AMBIEN) tablet 5 mg  5 mg Oral QHS PRN Karmen Bongo, MD        Medications Prior to Admission  Medication Sig Dispense Refill Last Dose  . acetaminophen (TYLENOL) 500 MG tablet Take 500 mg by mouth every 6 (six) hours as needed for moderate pain.   08/14/2020 at Unknown time  . carvedilol (COREG) 25 MG tablet Take 25 mg by mouth 2 (two) times daily.   08/14/2020 at 10 am  . cyanocobalamin 1000  MCG tablet Take by mouth.   08/14/2020 at Unknown time  . HUMALOG KWIKPEN 100 UNIT/ML KwikPen Inject 0-10 Units into the skin 3 (three) times daily as needed (blood sugar). Sliding scale   08/14/2020  . LANTUS SOLOSTAR 100 UNIT/ML Solostar Pen Inject 10 Units into the skin at bedtime.   08/14/2020  . Magnesium-Zinc (MAGNESIUM-CHELATED ZINC) 133.33-5 MG TABS Take 1 tablet by mouth daily.   08/14/2020 at Unknown time  . oxyCODONE (OXY IR/ROXICODONE) 5 MG immediate release tablet Take 5 mg by mouth every 4 (four) hours as needed for severe pain.   08/12/2020  . pantoprazole (PROTONIX) 40 MG tablet Take 40 mg by mouth 2 (two) times daily.   08/14/2020  . predniSONE (DELTASONE) 20 MG tablet Take 20 mg by mouth daily with breakfast.   08/14/2020  . rOPINIRole (REQUIP) 3 MG tablet Take 3 mg by mouth at bedtime.   08/14/2020  . rosuvastatin (CRESTOR) 40 MG tablet Take 40 mg by mouth daily.   08/14/2020  . sertraline (ZOLOFT) 100 MG tablet Take 100 mg by mouth daily.   08/14/2020  . sodium bicarbonate 650 MG tablet Take 1,300 mg by mouth 2 (two) times daily.   08/14/2020  . sulfamethoxazole-trimethoprim (BACTRIM) 400-80 MG tablet Take 1 tablet by mouth 3 (three) times a week.   08/12/2020  . tacrolimus (PROGRAF) 1 MG capsule Take 1 mg by mouth 3 (three) times daily.   08/14/2020  . Vitamin D, Ergocalciferol, (DRISDOL) 1.25 MG (50000 UNIT) CAPS capsule Take 50,000 Units by mouth once a week.   08/12/2020    History reviewed. No pertinent family history.   Review of Systems:   Review of Systems  Constitutional: Negative for fever.  Respiratory: Positive for cough.   Cardiovascular: Negative for chest pain, palpitations and leg swelling.  Gastrointestinal: Negative for abdominal pain and blood in stool.  Musculoskeletal: Positive for back pain, falls and joint pain.  Psychiatric/Behavioral: The patient has insomnia.    Pertinent items are noted in HPI.    Physical Exam: BP 132/86 (BP Location: Left Arm)   Pulse 76   Temp  (!) 97.5 F (36.4 C) (Oral)   Resp 20   Ht 5' 1"  (1.549 m)   Wt 58 kg   SpO2 94%   BMI 24.16 kg/m    General appearance: alert, cooperative and no distress Resp: clear to auscultation bilaterally Cardio: grade III systolic murmur, regular rate and rhythm  GI: soft, non-tender; bowel sounds normal; no masses,  no organomegaly Extremities: extremities normal, atraumatic, no cyanosis or edema  Diagnostic Studies & Laboratory data:     Recent Radiology Findings:   CT ABDOMEN PELVIS WO CONTRAST  Result Date: 08/24/2020 CLINICAL DATA:  Infectious gastroenteritis, ascending colitis EXAM: CT ABDOMEN AND PELVIS WITHOUT CONTRAST TECHNIQUE: Multidetector CT imaging of the abdomen and pelvis was performed following the standard  protocol without IV contrast. COMPARISON:  None. FINDINGS: Lower chest: Small bilateral pleural effusions are present with mild associated bibasilar compressive atelectasis. Large hiatal hernia. Hypoattenuation of the cardiac blood pool is in keeping with at least mild anemia. Global cardiac size within normal limits. Hepatobiliary: No focal liver abnormality is seen. No gallstones, gallbladder wall thickening, or biliary dilatation. Pancreas: Unremarkable Spleen: Borderline splenomegaly. No intrasplenic lesions identified. The splenic vein appears unremarkable on this noncontrast examination. Small splenule seen adjacent to the inferior pole of the spleen. Adrenals/Urinary Tract: The adrenal glands are unremarkable. The kidneys are enlarged and contain innumerable cortical cysts in keeping with autosomal dominant polycystic kidney disease. Numerous cysts demonstrate relative hyperdensity likely related to proteinaceous or hemorrhagic cysts. Several small nonobstructing calculi are noted within the lower pole of the right kidney measuring up to 4 mm. No hydronephrosis. No ureteral calculi. The bladder is unremarkable. Stomach/Bowel: Stomach, small bowel, and large bowel are  unremarkable save for mild sigmoid diverticulosis. The appendix is absent. An endoscopic clip is seen within the lumen of the cecum. Mild ascites. No free intraperitoneal gas Vascular/Lymphatic: No significant vascular findings are present. No enlarged abdominal or pelvic lymph nodes. Reproductive: Uterus and bilateral adnexa are unremarkable. Other: No abdominal wall hernia. Rectum unremarkable. There is moderate bilateral subcutaneous edema within the flanks and visualized lower extremities in keeping with at least mild anasarca. Musculoskeletal: Remote right inferior pubic ramus fracture. No acute bone abnormality. No lytic or blastic bone lesion. Degenerative changes are seen within the lumbar spine with grade 1 anterolisthesis of L5 upon S1. IMPRESSION: Endoscopic clip noted within the lumen of the cecum. The bowel, and specifically the colon, however, appears unremarkable and there is no evidence of perforation or focal inflammation. Autosomal dominant polycystic kidney disease. Minimal right nonobstructing nephrolithiasis. No hydronephrosis. No urolithiasis. Mild anasarca with small bilateral pleural effusions, mild ascites, and subcutaneous body wall edema. Electronically Signed   By: Fidela Salisbury MD   On: 08/24/2020 05:35   ECHO TEE  Result Date: 08/24/2020    TRANSESOPHOGEAL ECHO REPORT   Patient Name:   Ruth Riley [facility] Date of Exam: 08/24/2020 Medical Rec #:  [id number]              Height:       61.0 in Accession #:    [id number]             Weight:       127.9 lb Date of Birth:  1946-01-17              BSA:          1.562 m Patient Age:    39 years               BP:           159/96 mmHg Patient Gender: F                      HR:           68 bpm. Exam Location:  Inpatient Procedure: Transesophageal Echo, Cardiac Doppler, Color Doppler and Saline            Contrast Bubble Study Indications:     Bacteremia  History:         Patient has prior history of Echocardiogram examinations, most                   recent 08/19/2020. Signs/Symptoms:Bacteremia; Risk  Factors:Diabetes, Hypertension and Dyslipidemia. PNA, COVID+,                  kidney transplant.  Sonographer:     Dustin Flock RDCS Referring Phys:  [place] Diagnosing Phys: Dixie Dials MD PROCEDURE: The transesophogeal probe was passed without difficulty through the esophogus of the patient. Sedation performed by different physician. The patient was monitored while under deep sedation. Anesthestetic sedation was provided intravenously by Anesthesiology: 125.55m of Propofol. Image quality was good. The patient developed no complications during the procedure. IMPRESSIONS  1. Left ventricular ejection fraction, by estimation, is 55 to 60%. The left ventricle has normal function. The left ventricle has no regional wall motion abnormalities.  2. Right ventricular systolic function is normal. The right ventricular size is normal.  3. Left atrial size was moderately dilated. No left atrial/left atrial appendage thrombus was detected.  4. Possible large linear clot.  5. The pericardial effusion is circumferential. There is no evidence of cardiac tamponade.  6. Multiple small vegetation on the mitral valve.  7. The mitral valve is degenerative. Mild mitral valve regurgitation.  8. Aortic valve vegetation is visualized on the left.  9. The aortic valve is tricuspid. There is moderate calcification of the aortic valve. There is mild thickening of the aortic valve. Aortic valve regurgitation is mild. Mild to moderate aortic valve sclerosis/calcification is present, without any evidence of aortic stenosis. 10. There is mild (Grade II) atheroma plaque involving the aortic root, ascending aorta and descending aorta. 11. The inferior vena cava is normal in size with <50% respiratory variability, suggesting right atrial pressure of 8 mmHg. 12. Agitated saline contrast bubble study was positive with shunting observed after >6 cardiac  cycles suggestive of intrapulmonary shunting. Conclusion(s)/Recommendation(s): Findings are concerning for vegetation/infective endocarditis as detailed above. FINDINGS  Left Ventricle: Left ventricular ejection fraction, by estimation, is 55 to 60%. The left ventricle has normal function. The left ventricle has no regional wall motion abnormalities. The left ventricular internal cavity size was normal in size. Right Ventricle: The right ventricular size is normal. No increase in right ventricular wall thickness. Right ventricular systolic function is normal. Left Atrium: Left atrial size was moderately dilated. Spontaneous echo contrast was present in the left atrium. No left atrial/left atrial appendage thrombus was detected. Right Atrium: Right atrial size was normal in size. Prominent Eustachian valve and Possible large linear clot. Pericardium: Trivial pericardial effusion is present. The pericardial effusion is circumferential. There is no evidence of cardiac tamponade. Mitral Valve: The mitral valve is degenerative in appearance. There is mild thickening of the mitral valve leaflet(s). There is mild calcification of the mitral valve leaflet(s). Normal mobility of the mitral valve leaflets. Mild to moderate mitral annular calcification. A multiple small vegetation is seen on the anterior and posterior mitral leaflet. Mild mitral valve regurgitation. Tricuspid Valve: Small, mobile, multiple TV vegetation seen. The tricuspid valve is normal in structure. Tricuspid valve regurgitation is mild. Aortic Valve: The aortic valve is tricuspid. There is moderate calcification of the aortic valve. There is mild thickening of the aortic valve. There is moderate aortic valve annular calcification. Aortic valve regurgitation is mild. Mild to moderate aortic valve sclerosis/calcification is present, without any evidence of aortic stenosis. A mobile vegetation is seen on the left. Pulmonic Valve: The pulmonic valve was normal  in structure. Pulmonic valve regurgitation is not visualized. Aorta: The aortic root is normal in size and structure. There is mild (Grade II) atheroma plaque involving the  aortic root, ascending aorta and descending aorta. Venous: The left upper pulmonary vein, left lower pulmonary vein, right upper pulmonary vein and right lower pulmonary vein are normal. The inferior vena cava is normal in size with less than 50% respiratory variability, suggesting right atrial pressure of 8 mmHg. IAS/Shunts: The atrial septum is grossly normal. Agitated saline contrast was given intravenously to evaluate for intracardiac shunting. Agitated saline contrast bubble study was positive with shunting observed after >6 cardiac cycles suggestive of intrapulmonary shunting. There is no evidence of a patent foramen ovale. No ventricular septal defect is seen or detected. There is no evidence of an atrial septal defect. Additional Comments: There is a small pleural effusion in both left and right lateral regions. Dixie Dials MD Electronically signed by Dixie Dials MD Signature Date/Time: 08/24/2020/5:54:46 PM    Final      I have independently reviewed the above radiologic studies and discussed with the patient   Recent Lab Findings: Lab Results  Component Value Date   WBC 5.4 08/25/2020   HGB 9.1 (L) 08/25/2020   HCT 28.8 (L) 08/25/2020   PLT 184 08/25/2020   GLUCOSE 79 08/25/2020   ALT 16 08/19/2020   AST 16 08/19/2020   NA 138 08/25/2020   K 3.6 08/25/2020   CL 107 08/25/2020   CREATININE 2.53 (H) 08/25/2020   BUN 34 (H) 08/25/2020   CO2 23 08/25/2020   TSH 2.903 08/16/2020      Assessment / Plan:      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 Hypertension-BP well controlled, continue Coreg 3. Right atrial thrombus-IV heparin started yesterday, Dr. Doylene Canard following 3. history of renal transplant with current  stage IV CKD-nephrology following 4. Anemia with hx of GI bleed-no signs of active bleed.  5. diabetes mellitus-on insulin, SSI coverage and Lantus 6. chronic back pain-continue oxycodone and other home medications 7. history of COVID-19 with COVID-pneumonia-continue antibiotics, appears to be improving 8. Recent fall and injured her right wrist-wrist is splinted and was given steroids, orthopedics curb-side consult 9. Insomnia-on Ambien  Plan: I do not think this patient would be an appropriate surgical candidate given her high risk and DNR status, therefore we would recommend continued medical therapy at this time. Dr. Kipp Brood will review the Echocardiogram and will add any further recommendations below.     I  spent 30 minutes counseling the patient face to face.   Nicholes Rough, PA-C 08/25/2020 3:09 PM    Agree with above. This is a 75 year old female with fungal endocarditis involving the aortic, mitral and tricuspid valve.  She is also status post renal transplant in remains on Prograf for immunosuppression.  We had a long discussion about potential surgical treatment for this given the fact that she is very frail and also DNR she stated that she did not want to undergo any surgical procedures.  We discussed all the risks of not undergoing surgical correction, and she remained resolute in her decision not to proceed with surgery.  Fortunately she does not have significant valvular dysfunction from the endocarditis, most recent blood cultures are negative however fungal cultures may result later.  In any case she has refused surgery thus we can only recommend medical therapy.  Axcel Horsch Bary Leriche

## 2020-08-25 NOTE — Progress Notes (Signed)
Subjective: No new complaints   Antibiotics:  Anti-infectives (From admission, onward)   Start     Dose/Rate Route Frequency Ordered Stop   08/25/20 1000  anidulafungin (ERAXIS) 200 mg in sodium chloride 0.9 % 200 mL IVPB        200 mg 78 mL/hr over 200 Minutes Intravenous Daily 08/24/20 1445 09/01/20 0959   08/20/20 1000  cefdinir (OMNICEF) capsule 300 mg  Status:  Discontinued        300 mg Oral Daily 08/19/20 0918 08/19/20 0936   08/20/20 1000  cefdinir (OMNICEF) capsule 300 mg        300 mg Oral Daily 08/19/20 0936 08/21/20 0922   08/19/20 1015  azithromycin (ZITHROMAX) tablet 500 mg  Status:  Discontinued        500 mg Oral Daily 08/19/20 0916 08/19/20 0936   08/19/20 1015  azithromycin (ZITHROMAX) tablet 500 mg        500 mg Oral Daily 08/19/20 0936 08/21/20 0921   08/19/20 1000  anidulafungin (ERAXIS) 100 mg in sodium chloride 0.9 % 100 mL IVPB  Status:  Discontinued        100 mg 78 mL/hr over 100 Minutes Intravenous Daily 08/18/20 0247 08/24/20 1445   08/18/20 0400  anidulafungin (ERAXIS) 200 mg in sodium chloride 0.9 % 200 mL IVPB        200 mg 78 mL/hr over 200 Minutes Intravenous  Once 08/18/20 0247 08/18/20 0704   08/17/20 0900  sulfamethoxazole-trimethoprim (BACTRIM) 400-80 MG per tablet 1 tablet        1 tablet Oral Once per day on Mon Wed Fri 08/16/20 1430     08/17/20 0800  cefTRIAXone (ROCEPHIN) 2 g in sodium chloride 0.9 % 100 mL IVPB  Status:  Discontinued        2 g 200 mL/hr over 30 Minutes Intravenous Every 24 hours 08/16/20 1430 08/19/20 0918   08/17/20 0800  azithromycin (ZITHROMAX) 500 mg in sodium chloride 0.9 % 250 mL IVPB  Status:  Discontinued        500 mg 250 mL/hr over 60 Minutes Intravenous Every 24 hours 08/16/20 1430 08/19/20 0916      Medications: Scheduled Meds: . carvedilol  25 mg Oral BID  . Chlorhexidine Gluconate Cloth  6 each Topical Daily  . hydrALAZINE  25 mg Oral Q8H  . insulin aspart  0-9 Units Subcutaneous TID WC   . insulin glargine  10 Units Subcutaneous QHS  . pantoprazole  40 mg Oral BID  . peg 3350 powder  0.5 kit Oral Once  . predniSONE  10 mg Oral Q breakfast  . rOPINIRole  3 mg Oral QHS  . rosuvastatin  20 mg Oral Daily  . sertraline  100 mg Oral QHS  . sodium bicarbonate  1,300 mg Oral BID  . sulfamethoxazole-trimethoprim  1 tablet Oral Once per day on Mon Wed Fri  . tacrolimus  1 mg Oral TID   Continuous Infusions: . sodium chloride 10 mL/hr at 08/24/20 1450  . anidulafungin 200 mg (08/25/20 0959)  . heparin 850 Units/hr (08/25/20 0621)   PRN Meds:.acetaminophen **OR** acetaminophen, albuterol, calcium carbonate (dosed in mg elemental calcium), camphor-menthol **AND** hydrOXYzine, docusate sodium, feeding supplement (NEPRO CARB STEADY), guaiFENesin, hydrALAZINE, HYDROcodone-acetaminophen, lidocaine, lip balm, ondansetron **OR** ondansetron (ZOFRAN) IV, oxyCODONE, polyethylene glycol, sorbitol, zolpidem    Objective: Weight change:   Intake/Output Summary (Last 24 hours) at 08/25/2020 1353 Last data filed at 08/25/2020 1252 Gross per 24  hour  Intake 453.88 ml  Output 600 ml  Net -146.12 ml   Blood pressure 132/86, pulse 76, temperature (!) 97.5 F (36.4 C), temperature source Oral, resp. rate 20, height 5' 1"  (1.549 m), weight 58 kg, SpO2 94 %. Temp:  [97.2 F (36.2 C)-97.5 F (36.4 C)] 97.5 F (36.4 C) (04/14 1150) Pulse Rate:  [63-82] 76 (04/14 1150) Resp:  [10-20] 20 (04/14 1150) BP: (132-183)/(71-101) 132/86 (04/14 1150) SpO2:  [93 %-98 %] 94 % (04/14 1150)  Physical Exam: Physical Exam Constitutional:      General: She is not in acute distress.    Appearance: She is well-developed. She is not diaphoretic.  HENT:     Head: Normocephalic and atraumatic.     Right Ear: External ear normal.     Left Ear: External ear normal.     Mouth/Throat:     Pharynx: No oropharyngeal exudate.  Eyes:     General: No scleral icterus.    Conjunctiva/sclera: Conjunctivae normal.      Pupils: Pupils are equal, round, and reactive to light.  Cardiovascular:     Rate and Rhythm: Normal rate and regular rhythm.     Heart sounds: Normal heart sounds. No murmur heard. No friction rub. No gallop.   Pulmonary:     Effort: Pulmonary effort is normal. No respiratory distress.     Breath sounds: Normal breath sounds. No wheezing or rales.  Abdominal:     General: Bowel sounds are normal. There is no distension.     Palpations: Abdomen is soft.     Tenderness: There is no abdominal tenderness. There is no rebound.  Musculoskeletal:        General: No tenderness. Normal range of motion.  Lymphadenopathy:     Cervical: No cervical adenopathy.  Skin:    General: Skin is warm and dry.     Coloration: Skin is not pale.     Findings: No erythema or rash.  Neurological:     General: No focal deficit present.     Mental Status: She is alert and oriented to person, place, and time.     Motor: No abnormal muscle tone.     Coordination: Coordination normal.  Psychiatric:        Attention and Perception: Attention normal.        Mood and Affect: Mood is depressed.        Speech: Speech normal.        Behavior: Behavior normal.        Thought Content: Thought content normal.        Cognition and Memory: Cognition and memory normal.        Judgment: Judgment normal.      CBC:    BMET Recent Labs    08/25/20 0452  NA 138  K 3.6  CL 107  CO2 23  GLUCOSE 79  BUN 34*  CREATININE 2.53*  CALCIUM 8.1*     Liver Panel  No results for input(s): PROT, ALBUMIN, AST, ALT, ALKPHOS, BILITOT, BILIDIR, IBILI in the last 72 hours.     Sedimentation Rate No results for input(s): ESRSEDRATE in the last 72 hours. C-Reactive Protein No results for input(s): CRP in the last 72 hours.  Micro Results: Recent Results (from the past 720 hour(s))  Culture, blood (routine x 2) Call MD if unable to obtain prior to antibiotics being given     Status: Abnormal (Preliminary  result)   Collection Time: 08/16/20  3:50 PM  Specimen: BLOOD  Result Value Ref Range Status   Specimen Description BLOOD RIGHT ANTECUBITAL  Final   Special Requests   Final    BOTTLES DRAWN AEROBIC AND ANAEROBIC Blood Culture results may not be optimal due to an inadequate volume of blood received in culture bottles   Culture  Setup Time (A)  Final    YEAST AEROBIC BOTTLE ONLY CRITICAL RESULT CALLED TO, READ BACK BY AND VERIFIED WITH: PHARMD ABBOTT [phone] [id number] FCP    Culture (A)  Final    CANDIDA ALBICANS Sent to [place] for further susceptibility testing. Performed at [facility], [place] [address] [place], [place] [address]    Report Status PENDING  Incomplete  Blood Culture ID Panel (Reflexed)     Status: Abnormal   Collection Time: 08/16/20  3:50 PM  Result Value Ref Range Status   Enterococcus faecalis NOT DETECTED NOT DETECTED Final   Enterococcus Faecium NOT DETECTED NOT DETECTED Final   Listeria monocytogenes NOT DETECTED NOT DETECTED Final   Staphylococcus species NOT DETECTED NOT DETECTED Final   Staphylococcus aureus (BCID) NOT DETECTED NOT DETECTED Final   Staphylococcus epidermidis NOT DETECTED NOT DETECTED Final   Staphylococcus lugdunensis NOT DETECTED NOT DETECTED Final   Streptococcus species NOT DETECTED NOT DETECTED Final   Streptococcus agalactiae NOT DETECTED NOT DETECTED Final   Streptococcus pneumoniae NOT DETECTED NOT DETECTED Final   Streptococcus pyogenes NOT DETECTED NOT DETECTED Final   A.calcoaceticus-baumannii NOT DETECTED NOT DETECTED Final   Bacteroides fragilis NOT DETECTED NOT DETECTED Final   Enterobacterales NOT DETECTED NOT DETECTED Final   Enterobacter cloacae complex NOT DETECTED NOT DETECTED Final   Escherichia coli NOT DETECTED NOT DETECTED Final   Klebsiella aerogenes NOT DETECTED NOT DETECTED Final   Klebsiella oxytoca NOT DETECTED NOT DETECTED Final   Klebsiella pneumoniae NOT DETECTED NOT DETECTED Final   Proteus species NOT  DETECTED NOT DETECTED Final   Salmonella species NOT DETECTED NOT DETECTED Final   Serratia marcescens NOT DETECTED NOT DETECTED Final   Haemophilus influenzae NOT DETECTED NOT DETECTED Final   Neisseria meningitidis NOT DETECTED NOT DETECTED Final   Pseudomonas aeruginosa NOT DETECTED NOT DETECTED Final   Stenotrophomonas maltophilia NOT DETECTED NOT DETECTED Final   Candida albicans DETECTED (A) NOT DETECTED Final    Comment: CRITICAL RESULT CALLED TO, READ BACK BY AND VERIFIED WITH: PHARMD ABBOTT [phone] FCP    Candida auris NOT DETECTED NOT DETECTED Final   Candida glabrata NOT DETECTED NOT DETECTED Final   Candida krusei NOT DETECTED NOT DETECTED Final   Candida parapsilosis NOT DETECTED NOT DETECTED Final   Candida tropicalis NOT DETECTED NOT DETECTED Final   Cryptococcus neoformans/gattii NOT DETECTED NOT DETECTED Final    Comment: Performed at [facility], [address], [place], [place] [address]  Antifungal AST 9 Drug Panel     Status: None   Collection Time: 08/16/20  3:50 PM  Result Value Ref Range Status   Organism ID, Yeast Candida albicans  Corrected    Comment: (NOTE) Identification performed by account, not confirmed by this laboratory. CORRECTED ON 04/14 AT 1238: PREVIOUSLY REPORTED AS Preliminary report    Amphotericin B MIC 1.0 ug/mL  Final    Comment: (NOTE) *Breakpoints have been established for only some organism-drug combinations as indicated. Results of this test are labeled for research purposes only by the assay's manufacturer. The performance characteristics of this assay have not been established by the manufacturer. The result should not be used  for treatment or for diagnostic purposes without confirmation of the diagnosis by another medically established diagnostic product or procedure. The performance characteristics were determined by [facility].    Please Note: PENDING  Incomplete   Anidulafungin MIC Comment  Final    Comment:  (NOTE) 0.03 ug/mL Susceptible *Breakpoints have been established for only some organism-drug combinations as indicated. Results of this test are labeled for research purposes only by the assay's manufacturer. The performance characteristics of this assay have not been established by the manufacturer. The result should not be used for treatment or for diagnostic purposes without confirmation of the diagnosis by another medically established diagnostic product or procedure. The performance characteristics were determined by [facility].    Caspofungin MIC Comment  Final    Comment: 0.06 ug/mL Susceptible   Micafungin MIC Comment  Final    Comment: 0.016 ug/mL Susceptible   Posaconazole MIC 0.03 ug/mL  Final    Comment: (NOTE) *Breakpoints have been established for only some organism-drug combinations as indicated. Results of this test are labeled for research purposes only by the assay's manufacturer. The performance characteristics of this assay have not been established by the manufacturer. The result should not be used for treatment or for diagnostic purposes without confirmation of the diagnosis by another medically established diagnostic product or procedure. The performance characteristics were determined by [facility].    Please Note: PENDING  Incomplete   Fluconazole Islt MIC Comment  Final    Comment: 0.25 ug/mL Susceptible   Flucytosine MIC 1.0 ug/mL  Final   Itraconazole MIC 0.06 ug/mL  Final   Voriconazole MIC Comment  Final    Comment: (NOTE) 0.008 ug/mL or less, Susceptible Performed At: [facility] [address] [place], [place] [id number] Rush Farmer MD JS:[id number]    Source CANDIDA ALBICANS/ BLOOD  Final    Comment: Performed at [facility], [place] [address] [place], [place] [address]  Culture, blood (routine x 2) Call MD if unable to obtain prior to antibiotics being given     Status: Abnormal   Collection Time: 08/16/20  3:52 PM   Specimen:  BLOOD LEFT ARM  Result Value Ref Range Status   Specimen Description BLOOD LEFT ARM  Final   Special Requests   Final    BOTTLES DRAWN AEROBIC AND ANAEROBIC Blood Culture results may not be optimal due to an inadequate volume of blood received in culture bottles   Culture  Setup Time (A)  Final    YEAST AEROBIC BOTTLE ONLY CRITICAL VALUE NOTED.  VALUE IS CONSISTENT WITH PREVIOUSLY REPORTED AND CALLED VALUE. Performed at [facility], [place] [address] [place], [place] [address]    Culture CANDIDA ALBICANS (A)  Final   Report Status 08/21/2020 FINAL  Final  Culture, blood (routine x 2)     Status: None   Collection Time: 08/18/20  7:51 AM   Specimen: BLOOD  Result Value Ref Range Status   Specimen Description BLOOD RIGHT ANTECUBITAL  Final   Special Requests   Final    BOTTLES DRAWN AEROBIC AND ANAEROBIC Blood Culture adequate volume   Culture   Final    NO GROWTH 5 DAYS Performed at [facility], [place] [address], [place], [place] [address]    Report Status 08/23/2020 FINAL  Final  Culture, blood (routine x 2)     Status: None   Collection Time: 08/18/20  8:03 AM   Specimen: BLOOD LEFT WRIST  Result Value Ref Range Status  Specimen Description BLOOD LEFT WRIST  Final   Special Requests   Final    BOTTLES DRAWN AEROBIC AND ANAEROBIC Blood Culture adequate volume   Culture   Final    NO GROWTH 5 DAYS Performed at [facility], [address] [place], [place] [address]    Report Status 08/23/2020 FINAL  Final    Studies/Results: CT ABDOMEN PELVIS WO CONTRAST  Result Date: 08/24/2020 CLINICAL DATA:  Infectious gastroenteritis, ascending colitis EXAM: CT ABDOMEN AND PELVIS WITHOUT CONTRAST TECHNIQUE: Multidetector CT imaging of the abdomen and pelvis was performed following the standard protocol without IV contrast. COMPARISON:  None. FINDINGS: Lower chest: Small bilateral pleural effusions are present with mild associated bibasilar compressive atelectasis. Large  hiatal hernia. Hypoattenuation of the cardiac blood pool is in keeping with at least mild anemia. Global cardiac size within normal limits. Hepatobiliary: No focal liver abnormality is seen. No gallstones, gallbladder wall thickening, or biliary dilatation. Pancreas: Unremarkable Spleen: Borderline splenomegaly. No intrasplenic lesions identified. The splenic vein appears unremarkable on this noncontrast examination. Small splenule seen adjacent to the inferior pole of the spleen. Adrenals/Urinary Tract: The adrenal glands are unremarkable. The kidneys are enlarged and contain innumerable cortical cysts in keeping with autosomal dominant polycystic kidney disease. Numerous cysts demonstrate relative hyperdensity likely related to proteinaceous or hemorrhagic cysts. Several small nonobstructing calculi are noted within the lower pole of the right kidney measuring up to 4 mm. No hydronephrosis. No ureteral calculi. The bladder is unremarkable. Stomach/Bowel: Stomach, small bowel, and large bowel are unremarkable save for mild sigmoid diverticulosis. The appendix is absent. An endoscopic clip is seen within the lumen of the cecum. Mild ascites. No free intraperitoneal gas Vascular/Lymphatic: No significant vascular findings are present. No enlarged abdominal or pelvic lymph nodes. Reproductive: Uterus and bilateral adnexa are unremarkable. Other: No abdominal wall hernia. Rectum unremarkable. There is moderate bilateral subcutaneous edema within the flanks and visualized lower extremities in keeping with at least mild anasarca. Musculoskeletal: Remote right inferior pubic ramus fracture. No acute bone abnormality. No lytic or blastic bone lesion. Degenerative changes are seen within the lumbar spine with grade 1 anterolisthesis of L5 upon S1. IMPRESSION: Endoscopic clip noted within the lumen of the cecum. The bowel, and specifically the colon, however, appears unremarkable and there is no evidence of perforation or  focal inflammation. Autosomal dominant polycystic kidney disease. Minimal right nonobstructing nephrolithiasis. No hydronephrosis. No urolithiasis. Mild anasarca with small bilateral pleural effusions, mild ascites, and subcutaneous body wall edema. Electronically Signed   By: Fidela Salisbury MD   On: 08/24/2020 05:35   ECHO TEE  Result Date: 08/24/2020    TRANSESOPHOGEAL ECHO REPORT   Patient Name:   MYONNA CHISOM [facility] Date of Exam: 08/24/2020 Medical Rec #:  [id number]              Height:       61.0 in Accession #:    [id number]             Weight:       127.9 lb Date of Birth:  1945/09/07              BSA:          1.562 m Patient Age:    75 years               BP:           159/96 mmHg Patient Gender: F  HR:           68 bpm. Exam Location:  Inpatient Procedure: Transesophageal Echo, Cardiac Doppler, Color Doppler and Saline            Contrast Bubble Study Indications:     Bacteremia  History:         Patient has prior history of Echocardiogram examinations, most                  recent 08/19/2020. Signs/Symptoms:Bacteremia; Risk                  Factors:Diabetes, Hypertension and Dyslipidemia. PNA, COVID+,                  kidney transplant.  Sonographer:     Dustin Flock RDCS Referring Phys:  [place] [place] Diagnosing Phys: Dixie Dials MD PROCEDURE: The transesophogeal probe was passed without difficulty through the esophogus of the patient. Sedation performed by different physician. The patient was monitored while under deep sedation. Anesthestetic sedation was provided intravenously by Anesthesiology: 125.67m of Propofol. Image quality was good. The patient developed no complications during the procedure. IMPRESSIONS  1. Left ventricular ejection fraction, by estimation, is 55 to 60%. The left ventricle has normal function. The left ventricle has no regional wall motion abnormalities.  2. Right ventricular systolic function is normal. The right ventricular size is normal.   3. Left atrial size was moderately dilated. No left atrial/left atrial appendage thrombus was detected.  4. Possible large linear clot.  5. The pericardial effusion is circumferential. There is no evidence of cardiac tamponade.  6. Multiple small vegetation on the mitral valve.  7. The mitral valve is degenerative. Mild mitral valve regurgitation.  8. Aortic valve vegetation is visualized on the left.  9. The aortic valve is tricuspid. There is moderate calcification of the aortic valve. There is mild thickening of the aortic valve. Aortic valve regurgitation is mild. Mild to moderate aortic valve sclerosis/calcification is present, without any evidence of aortic stenosis. 10. There is mild (Grade II) atheroma plaque involving the aortic root, ascending aorta and descending aorta. 11. The inferior vena cava is normal in size with <50% respiratory variability, suggesting right atrial pressure of 8 mmHg. 12. Agitated saline contrast bubble study was positive with shunting observed after >6 cardiac cycles suggestive of intrapulmonary shunting. Conclusion(s)/Recommendation(s): Findings are concerning for vegetation/infective endocarditis as detailed above. FINDINGS  Left Ventricle: Left ventricular ejection fraction, by estimation, is 55 to 60%. The left ventricle has normal function. The left ventricle has no regional wall motion abnormalities. The left ventricular internal cavity size was normal in size. Right Ventricle: The right ventricular size is normal. No increase in right ventricular wall thickness. Right ventricular systolic function is normal. Left Atrium: Left atrial size was moderately dilated. Spontaneous echo contrast was present in the left atrium. No left atrial/left atrial appendage thrombus was detected. Right Atrium: Right atrial size was normal in size. Prominent Eustachian valve and Possible large linear clot. Pericardium: Trivial pericardial effusion is present. The pericardial effusion is  circumferential. There is no evidence of cardiac tamponade. Mitral Valve: The mitral valve is degenerative in appearance. There is mild thickening of the mitral valve leaflet(s). There is mild calcification of the mitral valve leaflet(s). Normal mobility of the mitral valve leaflets. Mild to moderate mitral annular calcification. A multiple small vegetation is seen on the anterior and posterior mitral leaflet. Mild mitral valve regurgitation. Tricuspid Valve: Small, mobile, multiple TV vegetation seen. The  tricuspid valve is normal in structure. Tricuspid valve regurgitation is mild. Aortic Valve: The aortic valve is tricuspid. There is moderate calcification of the aortic valve. There is mild thickening of the aortic valve. There is moderate aortic valve annular calcification. Aortic valve regurgitation is mild. Mild to moderate aortic valve sclerosis/calcification is present, without any evidence of aortic stenosis. A mobile vegetation is seen on the left. Pulmonic Valve: The pulmonic valve was normal in structure. Pulmonic valve regurgitation is not visualized. Aorta: The aortic root is normal in size and structure. There is mild (Grade II) atheroma plaque involving the aortic root, ascending aorta and descending aorta. Venous: The left upper pulmonary vein, left lower pulmonary vein, right upper pulmonary vein and right lower pulmonary vein are normal. The inferior vena cava is normal in size with less than 50% respiratory variability, suggesting right atrial pressure of 8 mmHg. IAS/Shunts: The atrial septum is grossly normal. Agitated saline contrast was given intravenously to evaluate for intracardiac shunting. Agitated saline contrast bubble study was positive with shunting observed after >6 cardiac cycles suggestive of intrapulmonary shunting. There is no evidence of a patent foramen ovale. No ventricular septal defect is seen or detected. There is no evidence of an atrial septal defect. Additional Comments:  There is a small pleural effusion in both left and right lateral regions. 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 female with history of end-stage renal disease status post renal transplantation admitted for fever with GI bleed and found to have candidemia.  She unfortunately has multiple vegetations on her mitral valve with mild mitral valve regurgitation, tricuspid valve with multiple small tricuspid valve vegetations with mild tricuspid valve regurgitation aortic valve vegetation with mild to moderate moderate aortic valve calcification but without stenosis and a mural thrombus  #1 Fungal endocarditis of three valves:   This is typically an indication for cardiothoracic surgery and valve replacement.  If no surgical intervention then will need 6 weeks of Eraxis followed by lifelong fluconazole  2 mural thrombus: Defer to primary team and gastroenterology ability to give her anticoagulation given her recent GI bleed  History renal transplant: Her renal transplant medications will be need to be adjusted when she is switched over to fluconazole.  I spent greater than 35 minutes with the patient including greater than 50% of time in face to face counsel of the patient and in coordination of  Her care with Dr. Sloan Leiter.  LOS:  8 days   Alcide Evener 08/25/2020, 1:53 PM

## 2020-08-25 NOTE — Progress Notes (Signed)
Physical Therapy Treatment Patient Details Name: Ruth Riley MRN: [id number] DOB: 1945/07/23 Today's Date: 08/25/2020    History of Present Illness Pt is 75 y.o. female who presented to [facility] with confusion, SoB, and cough and was transferred to [facility] and admitted 08/16/20. Pt being treated for anemia, bilateral PNA and possible GI bleed.  On 4/13 TEE found  small vegetations on MV, TV, AV and a mobile clot in the RA cavity. Pt started on heparin. Pt thought to have fx L 4th finger and was splinted - later found to not be broken and splint off.  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  PMH: DM; HTN; and h/o renal transplant with current stage 4 CKD (baseline 3.7) with anemia (baseline 9), COVID infection in early February. hx of multiple falls and orthopedic complaints most recently L ring finger fx.    PT Comments    Pt making slow, steady progress. Continue to feel she needs ST-SNF prior to return home.    Follow Up Recommendations  SNF     Equipment Recommendations  None recommended by PT    Recommendations for Other Services       Precautions / Restrictions Precautions Precautions: Fall Precaution Comments: multple falls Required Braces or Orthoses: Other Brace Splint/Cast: R wrist brace at all times; elevated R UE Restrictions Other Position/Activity Restrictions: No weightbearing restrictions ordered - pt unaware of any restrictions and reports R wrist injury present prior to admission    Mobility  Bed Mobility Overal bed mobility: Needs Assistance Bed Mobility: Supine to Sit Rolling: Mod assist   Supine to sit: Mod assist     General bed mobility comments:  Assist to bring legs off of bed and elevate trunk into sitting and bring hips to EOB    Transfers Overall transfer level: Needs assistance Equipment used: Rolling walker (2 wheeled);2 person hand held assist Transfers: Sit to/from [facility] Sit to Stand: Mod assist;+2 safety/equipment Stand pivot transfers: Mod assist;+2 safety/equipment       General transfer comment: Assist to bring hips up and for balance. Bed to [facility] with walker and small pivotal steps.  Ambulation/Gait Ambulation/Gait assistance: Min assist Gait Distance (Feet): 10 Feet Assistive device: Rolling walker (2 wheeled) Gait Pattern/deviations: Decreased stride length;Step-through pattern;Decreased step length - right;Decreased step length - left;Trunk flexed Gait velocity: decr Gait velocity interpretation: <1.31 ft/sec, indicative of household ambulator General Gait Details: Assist for balance and support. Assist to guide walker due to rt hand limitations   Stairs             Wheelchair Mobility    Modified Rankin (Stroke Patients Only)       Balance Overall balance assessment: Needs assistance Sitting-balance support: Feet supported;No upper extremity supported Sitting balance-Leahy Scale: Fair     Standing balance support: Bilateral upper extremity supported;During functional activity Standing balance-Leahy Scale: Poor Standing balance comment: walker and min assist for static standing                            Cognition Arousal/Alertness: Awake/alert Behavior During Therapy: WFL for tasks assessed/performed Overall Cognitive Status: Within Functional Limits for tasks assessed  Exercises      General Comments        Pertinent Vitals/Pain Pain Assessment: Faces Faces Pain Scale: Hurts even more Pain Location: low back Pain Descriptors / Indicators: Grimacing;Aching Pain Intervention(s):  Repositioned;Limited activity within patient's tolerance;Premedicated before session    Home Living                      Prior Function            PT Goals (current goals can now be found in the care plan section) Acute Rehab PT Goals Patient Stated Goal: feel better Progress towards PT goals: Progressing toward goals    Frequency    Min 2X/week      PT Plan Current plan remains appropriate    Co-evaluation              AM-PAC PT "6 Clicks" Mobility   Outcome Measure  Help needed turning from your back to your side while in a flat bed without using bedrails?: A Lot Help needed moving from lying on your back to sitting on the side of a flat bed without using bedrails?: A Lot Help needed moving to and from a bed to a chair (including a wheelchair)?: A Lot Help needed standing up from a chair using your arms (e.g., wheelchair or bedside chair)?: A Lot Help needed to walk in hospital room?: A Little Help needed climbing 3-5 steps with a railing? : Total 6 Click Score: 12    End of Session Equipment Utilized During Treatment: Gait belt (wrist brace) Activity Tolerance: Patient tolerated treatment well Patient left: with chair alarm set;in chair;with call bell/phone within reach (R arm elevated) Nurse Communication: Mobility status (purewick out) PT Visit Diagnosis: Unsteadiness on feet (R26.81);Muscle weakness (generalized) (M62.81);History of falling (Z91.81);Difficulty in walking, not elsewhere classified (R26.2);Pain Pain - part of body:  (back)     Time: [id number] PT Time Calculation (min) (ACUTE ONLY): 28 min  Charges:  $Gait Training: 8-22 mins $Therapeutic Activity: 8-22 mins                     [place] Pager [phone] [place] 08/25/2020, 11:09 AM

## 2020-08-25 NOTE — Progress Notes (Signed)
PROGRESS NOTE        PATIENT DETAILS Name: Ruth Riley Age: 75 y.o. Sex: female Date of Birth: 1946-01-24 Admit Date: 08/16/2020 Admitting Physician Karmen Bongo, MD OER:[id number], [name], MD  Brief Narrative: Patient is a 75 y.o. female with history of renal transplant, CKD stage IV, DM-2, HTN, HLD-presented to the hospital with multiple orthopedic complaints-s/p multiple falls with both right/left hand injury, confusion, worsening anemia-she was subsequently transferred from [facility] to [facility] for GI evaluation.  See below for further details.   Significant events: 4/5>> transferred from [facility] to [facility]  Significant studies: 4/6>> chest x-ray: No active disease. 4/8>> x-ray left hand: No acute fracture or dislocation. 4/8>> MRI right hand/wrist: No fracture/osteomyelitis-severe flexor Tina synovitis, second/third MCP joint erosions, enlarged radiocarpal midcarpal joint infusion-suggestive of inflammatory arthropathy. 4/8>> Echo: EF 08-13%, grade 2 diastolic dysfunction 8/87>> CT abdomen/pelvis: No perforation/focal inflammation in the bowel-autosomal dominant polycystic kidney disease.  Mild anasarca. 4/13>> TEE: Vegetation on MV, TV, AV, linear mobile clot in RA cavity  Antimicrobial therapy: Rocephin: 4/6>> 4/8 Zithromax: 4/6>> 4/10 Cefdinir: 4/9>> 4/10 Eraxis: 04/7>>  Microbiology data: 4/5>> blood culture: Candida albicans 4/7>> blood culture: No growth  Pathology: 4/12>> ascending colon biopsy: No inflammation/dysplasia/carcinoma  Procedures : 4/11>> right IJ tunneled CVC 4/12>> EGD: 2 angiectasia is in the stomach-1 with active oozing-s/p APC. 4/12>> colonoscopy: Edema/adherent blood in the proximal ascending colon/cecum-?  Ischemic colitis.  Diverticulosis in the sigmoid/descending colon. 4/13>> TEE: Vegetation on MV, TV, AV, linear mobile clot in RA cavity  Consults: GI, ID, IR, cards,CTVS  DVT Prophylaxis  : SCDs Start: 08/16/20 1429   Subjective: No major issues overnight-lying comfortably in bed-no hematochezia-no melena.  Started on IV heparin yesterday.   Assessment/Plan: Acute metabolic encephalopathy: Probably related to fungemia/pneumonia-resolved-suspect her mentation is back to baseline.  Multifocal pneumonia-recent history of COVID-19 infection: Overall improved-completed course of antibiotics.  Cultures as above.  Fungemia with endocarditis involving the mitral, tricuspid and aortic valve On Eraxis-repeat blood cultures negative-ID following-recommendations are to consult CTVS-although given her frailty/multiple medical problems-she will likely not be a candidate for valve replacement-and probably will need lifelong suppressive therapy.  Right atrial thrombus: Discussed with cardiologist-Dr. Aldean Jewett that this is related to central line-as the central line was just placed a day or so before the TEE-after extensive discussion with patient/spouse-started on IV heparin on 4/13-seems to be tolerating it pretty well without any obvious evidence of bleeding.  Dr. Doylene Canard recommends at least 1 month of anticoagulation-he suggests that if renal function tolerates-switching to SQ Lovenox when closer to discharge.  Unable to do Coumadin as patient is on Bactrim prophylaxis  Upper GI bleeding with acute blood loss anemia: EGD/colonoscopy as above-no further obvious GI loss apparent-has required PRBC transfusion on 4/6-CBC currently stable.  Given that a right atrial thrombus was seen on TEE on 4/13-has been started on IV heparin-no recurrence of GI bleeding while on anticoagulation so far.  On PPI.  This hospital stay-follow-up CBC closely.  Biopsy from colonoscopy negative for malignancy or inflammation.    AKI on CKD stage IV-renal transplantation: AKI likely hemodynamically mediated-has down trended-and now close to baseline.  History of renal transplantation: Continue  steroids/tacrolimus-on prophylactic Bactrim.  HTN: BP controlled-continue Coreg  HLD: Continue statin  DM-2: CBGs stable-watch closely for hypoglycemia-continue Lantus 10 units daily and SSI.   Recent Labs  08/24/20 2049 08/25/20 0848 08/25/20 1148  GLUCAP 162* 66* 187*   Right wrist pain/swelling: See above regarding MRI imaging-prior MD discussed with hand surgery-Dr. Hilbert Corrigan thought that this was related to inflammatory changes-recommendations were to keep splint for extended periods and steroids.  Chronic back pain: Continue supportive care.  Deconditioning/debility: Due to acute illness-SNF on discharge.   Diet: Diet Order            Diet Carb Modified Fluid consistency: Thin; Room service appropriate? Yes  Diet effective now                  Code Status: DNR  Family Communication: Spouse-David Vanosdol-[phone]-updated over the phone on 4/14.   Disposition Plan:  Status is: Inpatient  Remains inpatient appropriate because:Inpatient level of care appropriate due to severity of illness   Dispo: The patient is from: Home              Anticipated d/c is to: Home              Patient currently is not medically stable to d/c.   Difficult to place patient No   Barriers to Discharge: Fungemia-on Eraxis-TEE scheduled-needs SNF subsequently  Antimicrobial agents: Anti-infectives (From admission, onward)   Start     Dose/Rate Route Frequency Ordered Stop   08/25/20 1000  anidulafungin (ERAXIS) 200 mg in sodium chloride 0.9 % 200 mL IVPB        200 mg 78 mL/hr over 200 Minutes Intravenous Daily 08/24/20 1445 09/01/20 0959   08/20/20 1000  cefdinir (OMNICEF) capsule 300 mg  Status:  Discontinued        300 mg Oral Daily 08/19/20 0918 08/19/20 0936   08/20/20 1000  cefdinir (OMNICEF) capsule 300 mg        300 mg Oral Daily 08/19/20 0936 08/21/20 0922   08/19/20 1015  azithromycin (ZITHROMAX) tablet 500 mg  Status:  Discontinued        500 mg Oral Daily  08/19/20 0916 08/19/20 0936   08/19/20 1015  azithromycin (ZITHROMAX) tablet 500 mg        500 mg Oral Daily 08/19/20 0936 08/21/20 0921   08/19/20 1000  anidulafungin (ERAXIS) 100 mg in sodium chloride 0.9 % 100 mL IVPB  Status:  Discontinued        100 mg 78 mL/hr over 100 Minutes Intravenous Daily 08/18/20 0247 08/24/20 1445   08/18/20 0400  anidulafungin (ERAXIS) 200 mg in sodium chloride 0.9 % 200 mL IVPB        200 mg 78 mL/hr over 200 Minutes Intravenous  Once 08/18/20 0247 08/18/20 0704   08/17/20 0900  sulfamethoxazole-trimethoprim (BACTRIM) 400-80 MG per tablet 1 tablet        1 tablet Oral Once per day on Mon Wed Fri 08/16/20 1430     08/17/20 0800  cefTRIAXone (ROCEPHIN) 2 g in sodium chloride 0.9 % 100 mL IVPB  Status:  Discontinued        2 g 200 mL/hr over 30 Minutes Intravenous Every 24 hours 08/16/20 1430 08/19/20 0918   08/17/20 0800  azithromycin (ZITHROMAX) 500 mg in sodium chloride 0.9 % 250 mL IVPB  Status:  Discontinued        500 mg 250 mL/hr over 60 Minutes Intravenous Every 24 hours 08/16/20 1430 08/19/20 0916       Time spent: 35 minutes-Greater than 50% of this time was spent in counseling, explanation of diagnosis, planning of further management, and coordination of care.  MEDICATIONS: Scheduled Meds: . carvedilol  25 mg Oral BID  . Chlorhexidine Gluconate Cloth  6 each Topical Daily  . hydrALAZINE  25 mg Oral Q8H  . insulin aspart  0-9 Units Subcutaneous TID WC  . insulin glargine  10 Units Subcutaneous QHS  . pantoprazole  40 mg Oral BID  . peg 3350 powder  0.5 kit Oral Once  . predniSONE  10 mg Oral Q breakfast  . rOPINIRole  3 mg Oral QHS  . rosuvastatin  20 mg Oral Daily  . sertraline  100 mg Oral QHS  . sodium bicarbonate  1,300 mg Oral BID  . sulfamethoxazole-trimethoprim  1 tablet Oral Once per day on Mon Wed Fri  . tacrolimus  1 mg Oral TID   Continuous Infusions: . sodium chloride 10 mL/hr at 08/24/20 1450  . anidulafungin 200 mg  (08/25/20 0959)  . heparin 850 Units/hr (08/25/20 0621)   PRN Meds:.acetaminophen **OR** acetaminophen, albuterol, calcium carbonate (dosed in mg elemental calcium), camphor-menthol **AND** hydrOXYzine, docusate sodium, feeding supplement (NEPRO CARB STEADY), guaiFENesin, hydrALAZINE, HYDROcodone-acetaminophen, lidocaine, lip balm, ondansetron **OR** ondansetron (ZOFRAN) IV, oxyCODONE, polyethylene glycol, sorbitol, zolpidem   PHYSICAL EXAM: Vital signs: Vitals:   08/25/20 0100 08/25/20 0304 08/25/20 0747 08/25/20 1150  BP: (!) 171/82 (!) 161/85 (!) 155/84 132/86  Pulse: 63 68 67 76  Resp: _0 Temp: (!) 97.2 F (36.2 C) (!) 97.5 F (36.4 C) (!) 97.3 F (36.3 C) (!) 97.5 F (36.4 C)  TempSrc: Oral Oral Axillary Oral  SpO2: 98% 94% 95% 94%  Weight:      Height:       Filed Weights   08/20/20 0339 08/21/20 0347 08/24/20 1205  Weight: 57 kg 58 kg 58 kg   Body mass index is 24.16 kg/m.   Gen Exam:Alert awake-not in any distress HEENT:atraumatic, normocephalic Chest: B/L clear to auscultation anteriorly CVS:S1S2 regular Abdomen:soft non tender, non distended Extremities: Stable left wrist swelling. Neurology: Non focal Skin: no rash  I have personally reviewed following labs and imaging studies  LABORATORY DATA: CBC: Recent Labs  Lab 08/19/20 0158 08/20/20 0030 08/21/20 0018 08/22/20 0045 08/25/20 0919  WBC 3.0* 3.3* 4.1 4.7 5.4  HGB 8.4* 8.5* 9.0* 9.3* 9.1*  HCT 26.2* 26.2* 28.1* 29.9* 28.8*  MCV 87.0 88.2 88.4 89.3 88.6  PLT 141* 128* 140* 148* 846    Basic Metabolic Panel: Recent Labs  Lab 08/19/20 0158 08/20/20 0030 08/21/20 0018 08/22/20 0045 08/25/20 0452  NA 135 133* 134* 132* 138  K 3.4* 3.7 3.7 4.3 3.6  CL 101 104 104 104 107  CO2 _1 20* 23  GLUCOSE 122* 189* 155* 261* 79  BUN 56* 53* 50* 52* 34*  CREATININE 3.44* 3.17* 3.08* 3.01* 2.53*  CALCIUM 8.0* 7.6* 7.9* 7.6* 8.1*    GFR: Estimated Creatinine Clearance: 16.2 mL/min  (A) (by C-G formula based on SCr of 2.53 mg/dL (H)).  Liver Function Tests: Recent Labs  Lab 08/19/20 0158  AST 16  ALT 16  ALKPHOS 62  BILITOT 0.4  PROT 5.0*  ALBUMIN 1.9*   No results for input(s): LIPASE, AMYLASE in the last 168 hours. No results for input(s): AMMONIA in the last 168 hours.  Coagulation Profile: No results for input(s): INR, PROTIME in the last 168 hours.  Cardiac Enzymes: No results for input(s): CKTOTAL, CKMB, CKMBINDEX, TROPONINI in the last 168 hours.  BNP (last 3 results) No results for input(s): PROBNP in the last 8760 hours.  Lipid  Profile: No results for input(s): CHOL, HDL, LDLCALC, TRIG, CHOLHDL, LDLDIRECT in the last 72 hours.  Thyroid Function Tests: No results for input(s): TSH, T4TOTAL, FREET4, T3FREE, THYROIDAB in the last 72 hours.  Anemia Panel: No results for input(s): VITAMINB12, FOLATE, FERRITIN, TIBC, IRON, RETICCTPCT in the last 72 hours.  Urine analysis: No results found for: COLORURINE, APPEARANCEUR, LABSPEC, PHURINE, GLUCOSEU, HGBUR, BILIRUBINUR, KETONESUR, PROTEINUR, UROBILINOGEN, NITRITE, LEUKOCYTESUR  Sepsis Labs: Lactic Acid, Venous No results found for: LATICACIDVEN  MICROBIOLOGY: Recent Results (from the past 240 hour(s))  Culture, blood (routine x 2) Call MD if unable to obtain prior to antibiotics being given     Status: Abnormal (Preliminary result)   Collection Time: 08/16/20  3:50 PM   Specimen: BLOOD  Result Value Ref Range Status   Specimen Description BLOOD RIGHT ANTECUBITAL  Final   Special Requests   Final    BOTTLES DRAWN AEROBIC AND ANAEROBIC Blood Culture results may not be optimal due to an inadequate volume of blood received in culture bottles   Culture  Setup Time (A)  Final    YEAST AEROBIC BOTTLE ONLY CRITICAL RESULT CALLED TO, READ BACK BY AND VERIFIED WITH: PHARMD ABBOTT [phone] FCP    Culture (A)  Final    CANDIDA ALBICANS Sent to [place] for further susceptibility testing. Performed  at [facility], [place] [address], [place], [place] [address]    Report Status PENDING  Incomplete  Blood Culture ID Panel (Reflexed)     Status: Abnormal   Collection Time: 08/16/20  3:50 PM  Result Value Ref Range Status   Enterococcus faecalis NOT DETECTED NOT DETECTED Final   Enterococcus Faecium NOT DETECTED NOT DETECTED Final   Listeria monocytogenes NOT DETECTED NOT DETECTED Final   Staphylococcus species NOT DETECTED NOT DETECTED Final   Staphylococcus aureus (BCID) NOT DETECTED NOT DETECTED Final   Staphylococcus epidermidis NOT DETECTED NOT DETECTED Final   Staphylococcus lugdunensis NOT DETECTED NOT DETECTED Final   Streptococcus species NOT DETECTED NOT DETECTED Final   Streptococcus agalactiae NOT DETECTED NOT DETECTED Final   Streptococcus pneumoniae NOT DETECTED NOT DETECTED Final   Streptococcus pyogenes NOT DETECTED NOT DETECTED Final   A.calcoaceticus-baumannii NOT DETECTED NOT DETECTED Final   Bacteroides fragilis NOT DETECTED NOT DETECTED Final   Enterobacterales NOT DETECTED NOT DETECTED Final   Enterobacter cloacae complex NOT DETECTED NOT DETECTED Final   Escherichia coli NOT DETECTED NOT DETECTED Final   Klebsiella aerogenes NOT DETECTED NOT DETECTED Final   Klebsiella oxytoca NOT DETECTED NOT DETECTED Final   Klebsiella pneumoniae NOT DETECTED NOT DETECTED Final   Proteus species NOT DETECTED NOT DETECTED Final   Salmonella species NOT DETECTED NOT DETECTED Final   Serratia marcescens NOT DETECTED NOT DETECTED Final   Haemophilus influenzae NOT DETECTED NOT DETECTED Final   Neisseria meningitidis NOT DETECTED NOT DETECTED Final   Pseudomonas aeruginosa NOT DETECTED NOT DETECTED Final   Stenotrophomonas maltophilia NOT DETECTED NOT DETECTED Final   Candida albicans DETECTED (A) NOT DETECTED Final    Comment: CRITICAL RESULT CALLED TO, READ BACK BY AND VERIFIED WITH: PHARMD ABBOTT [phone] FCP    Candida auris NOT DETECTED NOT DETECTED Final    Candida glabrata NOT DETECTED NOT DETECTED Final   Candida krusei NOT DETECTED NOT DETECTED Final   Candida parapsilosis NOT DETECTED NOT DETECTED Final   Candida tropicalis NOT DETECTED NOT DETECTED Final   Cryptococcus neoformans/gattii NOT DETECTED NOT DETECTED Final    Comment: Performed at [facility], [place] [address], [place], [place] [address]  Antifungal AST 9 Drug Panel     Status: None   Collection Time: 08/16/20  3:50 PM  Result Value Ref Range Status   Organism ID, Yeast Candida albicans  Corrected    Comment: (NOTE) Identification performed by account, not confirmed by this laboratory. CORRECTED ON 04/14 AT 1238: PREVIOUSLY REPORTED AS Preliminary report    Amphotericin B MIC 1.0 ug/mL  Final    Comment: (NOTE) *Breakpoints have been established for only some organism-drug combinations as indicated. Results of this test are labeled for research purposes only by the assay's manufacturer. The performance characteristics of this assay have not been established by the manufacturer. The result should not be used for treatment or for diagnostic purposes without confirmation of the diagnosis by another medically established diagnostic product or procedure. The performance characteristics were determined by [facility].    Please Note: PENDING  Incomplete   Anidulafungin MIC Comment  Final    Comment: (NOTE) 0.03 ug/mL Susceptible *Breakpoints have been established for only some organism-drug combinations as indicated. Results of this test are labeled for research purposes only by the assay's manufacturer. The performance characteristics of this assay have not been established by the manufacturer. The result should not be used for treatment or for diagnostic purposes without confirmation of the diagnosis by another medically established diagnostic product or procedure. The performance characteristics were determined by [facility].    Caspofungin MIC Comment  Final     Comment: 0.06 ug/mL Susceptible   Micafungin MIC Comment  Final    Comment: 0.016 ug/mL Susceptible   Posaconazole MIC 0.03 ug/mL  Final    Comment: (NOTE) *Breakpoints have been established for only some organism-drug combinations as indicated. Results of this test are labeled for research purposes only by the assay's manufacturer. The performance characteristics of this assay have not been established by the manufacturer. The result should not be used for treatment or for diagnostic purposes without confirmation of the diagnosis by another medically established diagnostic product or procedure. The performance characteristics were determined by [facility].    Please Note: PENDING  Incomplete   Fluconazole Islt MIC Comment  Final    Comment: 0.25 ug/mL Susceptible   Flucytosine MIC 1.0 ug/mL  Final   Itraconazole MIC 0.06 ug/mL  Final   Voriconazole MIC Comment  Final    Comment: (NOTE) 0.008 ug/mL or less, Susceptible Performed At: [facility] [address] [place], [place] [id number] Rush Farmer MD VZ:[id number]    Source CANDIDA ALBICANS/ BLOOD  Final    Comment: Performed at [facility], [place] [address] [place],  [address]  Culture, blood (routine x 2) Call MD if unable to obtain prior to antibiotics being given     Status: Abnormal   Collection Time: 08/16/20  3:52 PM   Specimen: BLOOD LEFT ARM  Result Value Ref Range Status   Specimen Description BLOOD LEFT ARM  Final   Special Requests   Final    BOTTLES DRAWN AEROBIC AND ANAEROBIC Blood Culture results may not be optimal due to an inadequate volume of blood received in culture bottles   Culture  Setup Time (A)  Final    YEAST AEROBIC BOTTLE ONLY CRITICAL VALUE NOTED.  VALUE IS CONSISTENT WITH PREVIOUSLY REPORTED AND CALLED VALUE. Performed at [facility], [place] [address] [place],  [address]    Culture CANDIDA ALBICANS (A)  Final   Report Status 08/21/2020 FINAL  Final  Culture,  blood (routine x 2)     Status: None   Collection Time: 08/18/20  7:51 AM   Specimen: BLOOD  Result Value Ref Range Status   Specimen Description BLOOD RIGHT ANTECUBITAL  Final   Special Requests   Final    BOTTLES DRAWN AEROBIC AND ANAEROBIC Blood Culture adequate volume   Culture   Final    NO GROWTH 5 DAYS Performed at [facility], [address] [place], [place] [address]    Report Status 08/23/2020 FINAL  Final  Culture, blood (routine x 2)     Status: None   Collection Time: 08/18/20  8:03 AM   Specimen: BLOOD LEFT WRIST  Result Value Ref Range Status   Specimen Description BLOOD LEFT WRIST  Final   Special Requests   Final    BOTTLES DRAWN AEROBIC AND ANAEROBIC Blood Culture adequate volume   Culture   Final    NO GROWTH 5 DAYS Performed at [facility], [place] [address], [place], [place] [address]    Report Status 08/23/2020 FINAL  Final    RADIOLOGY STUDIES/RESULTS: CT ABDOMEN PELVIS WO CONTRAST  Result Date: 08/24/2020 CLINICAL DATA:  Infectious gastroenteritis, ascending colitis EXAM: CT ABDOMEN AND PELVIS WITHOUT CONTRAST TECHNIQUE: Multidetector CT imaging of the abdomen and pelvis was performed following the standard protocol without IV contrast. COMPARISON:  None. FINDINGS: Lower chest: Small bilateral pleural effusions are present with mild associated bibasilar compressive atelectasis. Large hiatal hernia. Hypoattenuation of the cardiac blood pool is in keeping with at least mild anemia. Global cardiac size within normal limits. Hepatobiliary: No focal liver abnormality is seen. No gallstones, gallbladder wall thickening, or biliary dilatation. Pancreas: Unremarkable Spleen: Borderline splenomegaly. No intrasplenic lesions identified. The splenic vein appears unremarkable on this noncontrast examination. Small splenule seen adjacent to the inferior pole of the spleen. Adrenals/Urinary Tract: The adrenal glands are unremarkable. The kidneys are enlarged and  contain innumerable cortical cysts in keeping with autosomal dominant polycystic kidney disease. Numerous cysts demonstrate relative hyperdensity likely related to proteinaceous or hemorrhagic cysts. Several small nonobstructing calculi are noted within the lower pole of the right kidney measuring up to 4 mm. No hydronephrosis. No ureteral calculi. The bladder is unremarkable. Stomach/Bowel: Stomach, small bowel, and large bowel are unremarkable save for mild sigmoid diverticulosis. The appendix is absent. An endoscopic clip is seen within the lumen of the cecum. Mild ascites. No free intraperitoneal gas Vascular/Lymphatic: No significant vascular findings are present. No enlarged abdominal or pelvic lymph nodes. Reproductive: Uterus and bilateral adnexa are unremarkable. Other: No abdominal wall hernia. Rectum unremarkable. There is moderate bilateral subcutaneous edema within the flanks and visualized lower extremities in keeping with at least mild anasarca. Musculoskeletal: Remote right inferior pubic ramus fracture. No acute bone abnormality. No lytic or blastic bone lesion. Degenerative changes are seen within the lumbar spine with grade 1 anterolisthesis of L5 upon S1. IMPRESSION: Endoscopic clip noted within the lumen of the cecum. The bowel, and specifically the colon, however, appears unremarkable and there is no evidence of perforation or focal inflammation. Autosomal dominant polycystic kidney disease. Minimal right nonobstructing nephrolithiasis. No hydronephrosis. No urolithiasis. Mild anasarca with small bilateral pleural effusions, mild ascites, and subcutaneous body wall edema. Electronically Signed   By: Fidela Salisbury MD   On: 08/24/2020 05:35   ECHO TEE  Result Date: 08/24/2020    TRANSESOPHOGEAL ECHO REPORT   Patient Name:   Ruth Riley [facility] Date of Exam: 08/24/2020 Medical Rec #:  [id number]              Height:       61.0 in Accession #:    [id number]             Weight:       127.9 lb  Date of Birth:  December 16, 1945              BSA:          1.562 m Patient Age:    55 years               BP:           159/96 mmHg Patient Gender: F                      HR:           68 bpm. Exam Location:  Inpatient Procedure: Transesophageal Echo, Cardiac Doppler, Color Doppler and Saline            Contrast Bubble Study Indications:     Bacteremia  History:         Patient has prior history of Echocardiogram examinations, most                  recent 08/19/2020. Signs/Symptoms:Bacteremia; Risk                  Factors:Diabetes, Hypertension and Dyslipidemia. PNA, COVID+,                  kidney transplant.  Sonographer:     Dustin Flock RDCS Referring Phys:  Eleanor Diagnosing Phys: Dixie Dials MD PROCEDURE: The transesophogeal probe was passed without difficulty through the esophogus of the patient. Sedation performed by different physician. The patient was monitored while under deep sedation. Anesthestetic sedation was provided intravenously by Anesthesiology: 125.4m of Propofol. Image quality was good. The patient developed no complications during the procedure. IMPRESSIONS  1. Left ventricular ejection fraction, by estimation, is 55 to 60%. The left ventricle has normal function. The left ventricle has no regional wall motion abnormalities.  2. Right ventricular systolic function is normal. The right ventricular size is normal.  3. Left atrial size was moderately dilated. No left atrial/left atrial appendage thrombus was detected.  4. Possible large linear clot.  5. The pericardial effusion is circumferential. There is no evidence of cardiac tamponade.  6. Multiple small vegetation on the mitral valve.  7. The mitral valve is degenerative. Mild mitral valve regurgitation.  8. Aortic valve vegetation is visualized on the left.  9. The aortic valve is tricuspid. There is moderate calcification of the aortic valve. There is mild thickening of the aortic valve. Aortic valve regurgitation is mild.  Mild to moderate aortic valve sclerosis/calcification is present, without any evidence of aortic stenosis. 10. There is mild (Grade II) atheroma plaque involving the aortic root, ascending aorta and descending aorta. 11. The inferior vena cava is normal in size with <50% respiratory variability, suggesting right atrial pressure of 8 mmHg. 12. Agitated saline contrast bubble study was positive with shunting observed after >6 cardiac cycles suggestive of intrapulmonary shunting. Conclusion(s)/Recommendation(s): Findings are concerning for vegetation/infective endocarditis as detailed above. FINDINGS  Left Ventricle: Left ventricular ejection fraction, by estimation, is 55 to 60%. The left ventricle has normal function. The left ventricle has no regional wall motion abnormalities. The left ventricular internal cavity size was normal in size. Right Ventricle: The right ventricular size is normal. No  increase in right ventricular wall thickness. Right ventricular systolic function is normal. Left Atrium: Left atrial size was moderately dilated. Spontaneous echo contrast was present in the left atrium. No left atrial/left atrial appendage thrombus was detected. Right Atrium: Right atrial size was normal in size. Prominent Eustachian valve and Possible large linear clot. Pericardium: Trivial pericardial effusion is present. The pericardial effusion is circumferential. There is no evidence of cardiac tamponade. Mitral Valve: The mitral valve is degenerative in appearance. There is mild thickening of the mitral valve leaflet(s). There is mild calcification of the mitral valve leaflet(s). Normal mobility of the mitral valve leaflets. Mild to moderate mitral annular calcification. A multiple small vegetation is seen on the anterior and posterior mitral leaflet. Mild mitral valve regurgitation. Tricuspid Valve: Small, mobile, multiple TV vegetation seen. The tricuspid valve is normal in structure. Tricuspid valve regurgitation  is mild. Aortic Valve: The aortic valve is tricuspid. There is moderate calcification of the aortic valve. There is mild thickening of the aortic valve. There is moderate aortic valve annular calcification. Aortic valve regurgitation is mild. Mild to moderate aortic valve sclerosis/calcification is present, without any evidence of aortic stenosis. A mobile vegetation is seen on the left. Pulmonic Valve: The pulmonic valve was normal in structure. Pulmonic valve regurgitation is not visualized. Aorta: The aortic root is normal in size and structure. There is mild (Grade II) atheroma plaque involving the aortic root, ascending aorta and descending aorta. Venous: The left upper pulmonary vein, left lower pulmonary vein, right upper pulmonary vein and right lower pulmonary vein are normal. The inferior vena cava is normal in size with less than 50% respiratory variability, suggesting right atrial pressure of 8 mmHg. IAS/Shunts: The atrial septum is grossly normal. Agitated saline contrast was given intravenously to evaluate for intracardiac shunting. Agitated saline contrast bubble study was positive with shunting observed after >6 cardiac cycles suggestive of intrapulmonary shunting. There is no evidence of a patent foramen ovale. No ventricular septal defect is seen or detected. There is no evidence of an atrial septal defect. Additional Comments: There is a small pleural effusion in both left and right lateral regions. Dixie Dials MD Electronically signed by Dixie Dials MD Signature Date/Time: 08/24/2020/5:54:46 PM    Final      LOS: 8 days   Oren Binet, MD  [facility]    To contact the attending provider between 7A-7P or the covering provider during after hours 7P-7A, please log into the web site [email or web address] and access using universal [place] password for that web site. If you do not have the password, please call the hospital operator.  08/25/2020, 2:00 PM

## 2020-08-25 NOTE — Progress Notes (Signed)
Inpatient Diabetes Program Recommendations  AACE/ADA: New Consensus Statement on Inpatient Glycemic Control (2015)  Target Ranges:  Prepandial:   less than 140 mg/dL      Peak postprandial:   less than 180 mg/dL (1-2 hours)      Critically ill patients:  140 - 180 mg/dL   Lab Results  Component Value Date   GLUCAP 66 (L) 08/25/2020    Review of Glycemic Control Results for Ruth Riley, Ruth Riley (MRN [id number]) as of 08/25/2020 09:19  Ref. Range 08/24/2020 07:55 08/24/2020 14:56 08/24/2020 17:11 08/24/2020 20:49 08/25/2020 08:48  Glucose-Capillary Latest Ref Range: 70 - 99 mg/dL 96 125 (H) 198 (H) 162 (H) 66 (L)   Diabetes history: Dm 2 Outpatient Diabetes medications: Lantus 10 units Current orders for Inpatient glycemic control:  Lantus 10 units qhs Novolog 0-9 units tid  PO prednisone 10 mg Daily  Inpatient Diabetes Program Recommendations:    Note hypoglycemia  -reduce Lantus to 8 units  Thanks,  Tama Headings RN, MSN, BC-ADM Inpatient Diabetes Coordinator Team Pager [phone] (8a-5p)

## 2020-08-25 NOTE — Progress Notes (Signed)
Patient has had elevated blood pressures x 2, current systolic BP is [id number]. Spoke with Dr. Tonie Griffith, MD notified of current vitals and tele requested due to recent TEE findings. Tele re-ordered at this time as well as one time dose of '10mg'$  hydralazine IV. Will medicate as ordered and continue to monitor.

## 2020-08-25 NOTE — Anesthesia Postprocedure Evaluation (Signed)
Anesthesia Post Note  Patient: Ruth Riley  Procedure(s) Performed: TRANSESOPHAGEAL ECHOCARDIOGRAM (TEE) (N/A ) BUBBLE STUDY     Patient location during evaluation: PACU Anesthesia Type: MAC Level of consciousness: awake and alert Pain management: pain level controlled Vital Signs Assessment: post-procedure vital signs reviewed and stable Respiratory status: spontaneous breathing, nonlabored ventilation, respiratory function stable and patient connected to nasal cannula oxygen Cardiovascular status: stable and blood pressure returned to baseline Postop Assessment: no apparent nausea or vomiting Anesthetic complications: no   No complications documented.  Last Vitals:  Vitals:   08/25/20 1150 08/25/20 1642  BP: 132/86 (!) 180/86  Pulse: 76 80  Resp: 20 (!) 24  Temp: (!) 36.4 C (!) 36.4 C  SpO2: 94% 95%    Last Pain:  Vitals:   08/25/20 1905  TempSrc:   PainSc: [place]

## 2020-08-25 NOTE — Progress Notes (Signed)
ANTICOAGULATION CONSULT NOTE - Follow Up Consult  Pharmacy Consult for Heparin Indication: Intracardiac clot  Allergies  Allergen Reactions  . Penicillins Hives and Other (See Comments)  . Tramadol Other (See Comments) and Palpitations    Patient reports it makes her feel spaced out Patient reports it makes her feel spaced out Patient reports it makes her feel spaced out Patient reports it makes her feel spaced out   . Donepezil Diarrhea and Other (See Comments)  . Amlodipine Swelling    dizziness    Patient Measurements: Height: '5\' 1"'$  (154.9 cm) Weight: 58 kg (127 lb 13.9 oz) IBW/kg (Calculated) : 47.8 Heparin Dosing Weight:    Vital Signs: Temp: 97.5 F (36.4 C) (04/14 1150) Temp Source: Oral (04/14 1150) BP: 132/86 (04/14 1150) Pulse Rate: 76 (04/14 1150)  Labs: Recent Labs    08/25/20 0452 08/25/20 0919 08/25/20 1426  HGB  --  9.1*  --   HCT  --  28.8*  --   PLT  --  184  --   HEPARINUNFRC 0.16*  --  0.27*  CREATININE 2.53*  --   --     Estimated Creatinine Clearance: 16.2 mL/min (A) (by C-G formula based on SCr of 2.53 mg/dL (H)).   Assessment: Anticoag: 4/13: Mobile clot in RA cavity on TEE. Recent GI bleeding. IV heparin. Hgb 9.1 stable. Plts up to 184. HL 0.27 slightly < goal.  Goal of Therapy:  Heparin level 0.3-0.5 units/ml Monitor platelets by anticoagulation protocol: Yes   Plan:  Increase IV heparin (no boluses with GIB) to 900 units/hr Dr. Sloan Leiter really wants heparin minimized Daily HL and CBC   Fumie Fiallo S. Alford Highland, PharmD, BCPS Clinical Staff Pharmacist [email or web address] Eilene Ghazi Stillinger 08/25/2020,3:30 PM

## 2020-08-25 NOTE — Consult Note (Signed)
Ref: [name], MD   Subjective:  Awake and afebrile. Sitting up and eating breakfast. Had questions about transfer to rehab facility.  Objective:  Vital Signs in the last 24 hours: Temp:  [97.2 F (36.2 C)-98.8 F (37.1 C)] 97.3 F (36.3 C) (04/14 0747) Pulse Rate:  [63-82] 67 (04/14 0747) Cardiac Rhythm: Normal sinus rhythm (04/14 0700) Resp:  [10-16] 11 (04/14 0747) BP: (132-204)/(71-102) 155/84 (04/14 0747) SpO2:  [93 %-98 %] 95 % (04/14 0747) Weight:  [58 kg] 58 kg (04/13 1205)  Physical Exam: BP Readings from Last 1 Encounters:  08/25/20 (!) 155/84     Wt Readings from Last 1 Encounters:  08/24/20 58 kg    Weight change:  Body mass index is 24.16 kg/m. HEENT: [place]/AT, Eyes-Hazel, Conjunctiva-Pale pink, Sclera-Non-icteric Neck: No JVD, No bruit, Trachea midline. Lungs:  Clear, Bilateral. Cardiac:  Regular rhythm, normal S1 and S2, no S3. III/VI systolic and II/VI diastolic murmur. Abdomen:  Soft, non-tender. BS present. Extremities:  No edema present. No cyanosis. No clubbing. CNS: AxOx2, Cranial nerves grossly intact, moves all 4 extremities.  Skin: Warm and dry.   Intake/Output from previous day: 04/13 0701 - 04/14 0700 In: 227 [I.V.:227] Out: 1050 [Urine:1050]    Lab Results: BMET    Component Value Date/Time   NA 138 08/25/2020 0452   NA 132 (L) 08/22/2020 0045   NA 134 (L) 08/21/2020 0018   NA 138 07/20/2020 0000   NA 133 (A) 06/22/2020 0000   NA 141 05/25/2020 0000   K 3.6 08/25/2020 0452   K 4.3 08/22/2020 0045   K 3.7 08/21/2020 0018   CL 107 08/25/2020 0452   CL 104 08/22/2020 0045   CL 104 08/21/2020 0018   CO2 23 08/25/2020 0452   CO2 20 (L) 08/22/2020 0045   CO2 24 08/21/2020 0018   GLUCOSE 79 08/25/2020 0452   GLUCOSE 261 (H) 08/22/2020 0045   GLUCOSE 155 (H) 08/21/2020 0018   BUN 34 (H) 08/25/2020 0452   BUN 52 (H) 08/22/2020 0045   BUN 50 (H) 08/21/2020 0018   BUN 41 (A) 07/20/2020 0000   BUN 57 (A) 06/22/2020 0000   BUN  54 (A) 05/25/2020 0000   CREATININE 2.53 (H) 08/25/2020 0452   CREATININE 3.01 (H) 08/22/2020 0045   CREATININE 3.08 (H) 08/21/2020 0018   CALCIUM 8.1 (L) 08/25/2020 0452   CALCIUM 7.6 (L) 08/22/2020 0045   CALCIUM 7.9 (L) 08/21/2020 0018   GFRNONAA 20 (L) 08/25/2020 0452   GFRNONAA 16 (L) 08/22/2020 0045   GFRNONAA 15 (L) 08/21/2020 0018   CBC    Component Value Date/Time   WBC 5.4 08/25/2020 0919   RBC 3.25 (L) 08/25/2020 0919   HGB 9.1 (L) 08/25/2020 0919   HCT 28.8 (L) 08/25/2020 0919   PLT 184 08/25/2020 0919   MCV 88.6 08/25/2020 0919   MCV 90 06/22/2020 0000   MCH 28.0 08/25/2020 0919   MCHC 31.6 08/25/2020 0919   RDW 17.0 (H) 08/25/2020 0919   LYMPHSABS 0.5 (L) 08/16/2020 1556   MONOABS 0.2 08/16/2020 1556   EOSABS 0.1 08/16/2020 1556   BASOSABS 0.0 08/16/2020 1556   HEPATIC Function Panel Recent Labs    08/16/20 1556 08/18/20 0232 08/19/20 0158  PROT 6.0* 5.0* 5.0*   HEMOGLOBIN A1C No components found for: HGA1C,  MPG CARDIAC ENZYMES No results found for: CKTOTAL, CKMB, CKMBINDEX, TROPONINI BNP No results for input(s): PROBNP in the last 8760 hours. TSH Recent Labs    08/16/20  1556  TSH 2.903   CHOLESTEROL No results for input(s): CHOL in the last 8760 hours.  Scheduled Meds: . carvedilol  25 mg Oral BID  . Chlorhexidine Gluconate Cloth  6 each Topical Daily  . hydrALAZINE  25 mg Oral Q8H  . insulin aspart  0-9 Units Subcutaneous TID WC  . insulin glargine  10 Units Subcutaneous QHS  . pantoprazole  40 mg Oral BID  . peg 3350 powder  0.5 kit Oral Once  . predniSONE  10 mg Oral Q breakfast  . rOPINIRole  3 mg Oral QHS  . rosuvastatin  20 mg Oral Daily  . sertraline  100 mg Oral QHS  . sodium bicarbonate  1,300 mg Oral BID  . sulfamethoxazole-trimethoprim  1 tablet Oral Once per day on Mon Wed Fri  . tacrolimus  1 mg Oral TID   Continuous Infusions: . sodium chloride 10 mL/hr at 08/24/20 1450  . anidulafungin 200 mg (08/25/20 0959)  .  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 F/U in 4 weeks.   LOS: 8 days   Time spent including chart review, lab review, examination, discussion with patient/Nurse/Doctor : 30 min   Dixie Dials  MD  08/25/2020, 10:42 AM

## 2020-08-26 DIAGNOSIS — I330 Acute and subacute infective endocarditis: Secondary | ICD-10-CM | POA: Diagnosis not present

## 2020-08-26 DIAGNOSIS — B376 Candidal endocarditis: Secondary | ICD-10-CM

## 2020-08-26 DIAGNOSIS — I129 Hypertensive chronic kidney disease with stage 1 through stage 4 chronic kidney disease, or unspecified chronic kidney disease: Secondary | ICD-10-CM

## 2020-08-26 DIAGNOSIS — N184 Chronic kidney disease, stage 4 (severe): Secondary | ICD-10-CM | POA: Diagnosis not present

## 2020-08-26 DIAGNOSIS — M12831 Other specific arthropathies, not elsewhere classified, right wrist: Secondary | ICD-10-CM

## 2020-08-26 DIAGNOSIS — I513 Intracardiac thrombosis, not elsewhere classified: Secondary | ICD-10-CM

## 2020-08-26 DIAGNOSIS — S6292XA Unspecified fracture of left wrist and hand, initial encounter for closed fracture: Secondary | ICD-10-CM

## 2020-08-26 DIAGNOSIS — D649 Anemia, unspecified: Secondary | ICD-10-CM | POA: Diagnosis not present

## 2020-08-26 DIAGNOSIS — K921 Melena: Secondary | ICD-10-CM | POA: Diagnosis not present

## 2020-08-26 DIAGNOSIS — Z7189 Other specified counseling: Secondary | ICD-10-CM

## 2020-08-26 DIAGNOSIS — K31811 Angiodysplasia of stomach and duodenum with bleeding: Secondary | ICD-10-CM | POA: Diagnosis not present

## 2020-08-26 DIAGNOSIS — K2981 Duodenitis with bleeding: Secondary | ICD-10-CM

## 2020-08-26 LAB — BASIC METABOLIC PANEL
Anion gap: 7 (ref 5–15)
BUN: 36 mg/dL — ABNORMAL HIGH (ref 8–23)
CO2: 18 mmol/L — ABNORMAL LOW (ref 22–32)
Calcium: 7.8 mg/dL — ABNORMAL LOW (ref 8.9–10.3)
Chloride: 108 mmol/L (ref 98–111)
Creatinine, Ser: 2.57 mg/dL — ABNORMAL HIGH (ref 0.44–1.00)
GFR, Estimated: 19 mL/min — ABNORMAL LOW (ref >60)
Glucose, Bld: 119 mg/dL — ABNORMAL HIGH (ref 70–99)
Potassium: 4.2 mmol/L (ref 3.5–5.1)
Sodium: 133 mmol/L — ABNORMAL LOW (ref 135–145)

## 2020-08-26 LAB — CBC
HCT: 28 % — ABNORMAL LOW (ref 36.0–46.0)
Hemoglobin: 9 g/dL — ABNORMAL LOW (ref 12.0–15.0)
MCH: 27.9 pg (ref 26.0–34.0)
MCHC: 32.1 g/dL (ref 30.0–36.0)
MCV: 86.7 fL (ref 80.0–100.0)
Platelets: 146 10*3/uL — ABNORMAL LOW (ref 150–400)
RBC: 3.23 MIL/uL — ABNORMAL LOW (ref 3.87–5.11)
RDW: 17.1 % — ABNORMAL HIGH (ref 11.5–15.5)
WBC: 5.5 10*3/uL (ref 4.0–10.5)
nRBC: 0 % (ref 0.0–0.2)

## 2020-08-26 LAB — GLUCOSE, CAPILLARY
Glucose-Capillary: 85 mg/dL (ref 70–99)
Glucose-Capillary: 157 mg/dL — ABNORMAL HIGH (ref 70–99)
Glucose-Capillary: 102 mg/dL — ABNORMAL HIGH (ref 70–99)
Glucose-Capillary: 160 mg/dL — ABNORMAL HIGH (ref 70–99)

## 2020-08-26 LAB — HEPARIN LEVEL (UNFRACTIONATED)
Heparin Unfractionated: 0.23 IU/mL — ABNORMAL LOW (ref 0.30–0.70)
Heparin Unfractionated: 0.38 IU/mL (ref 0.30–0.70)

## 2020-08-26 NOTE — Progress Notes (Signed)
Occupational Therapy Treatment Patient Details Name: Ruth Riley MRN: [id number] DOB: Sep 08, 1945 Today's Date: 08/26/2020    History of present illness Pt is 75 y.o. female who presented to [facility] with confusion, SoB, and cough and was transferred to [facility] and admitted 08/16/20. Pt being treated for anemia, bilateral PNA and possible GI bleed.  On 4/13 TEE found  small vegetations on MV, TV, AV and a mobile clot in the RA cavity. Pt started on heparin. Pt thought to have fx L 4th finger and was splinted - later found to not be broken and splint off.  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  PMH: DM; HTN; and h/o renal transplant with current stage 4 CKD (baseline 3.7) with anemia (baseline 9), COVID infection in early February. hx of multiple falls and orthopedic complaints most recently L ring finger fx.   OT comments  Pt progressing towards OT goals, able to progress from Mod A to Min A for sit to stand transfers, and Mod A x 1 for pivots to [facility] and recliner using RW. Pt limited by decreased endurance and progressive back pain during activity. Pt able to demo compensatory strategies using L UE with Min A for toileting task and Setup for self feeding. Pt's family present during session. Encouraged continued elevation of R UE and AROM of digits. DC recs remain appropriate.    Follow Up Recommendations  SNF    Equipment Recommendations  3 in 1 bedside commode    Recommendations for Other Services      Precautions / Restrictions Precautions Precautions: Fall Precaution Comments: multple falls Required Braces or Orthoses: Other Brace Splint/Cast: R wrist brace at all times; foam arm  elevator Restrictions Weight Bearing Restrictions: No       Mobility Bed Mobility Overal bed mobility: Needs Assistance Bed Mobility: Supine to Sit     Supine to sit: HOB elevated;Mod assist     General bed mobility comments: Assist to bring legs off of bed and elevate trunk into sitting and bring hips to EOB    Transfers Overall transfer level: Needs assistance Equipment used: Rolling walker (2 wheeled) Transfers: Sit to/from [facility] Sit to Stand: Mod assist Stand pivot transfers: Mod assist       General transfer comment: Mod A for initial sit to stand from bedside, progressing to Min A from [facility]. Mod A for taking steps, maintaining balance and manuevering RW to end up in chair for meal    Balance Overall balance assessment: Needs assistance Sitting-balance support: Feet supported;No upper extremity supported Sitting balance-Leahy Scale: Fair     Standing balance support: Bilateral upper extremity supported;During functional activity Standing balance-Leahy Scale: Poor Standing balance comment: reliant on UE support                           ADL either performed or assessed with clinical judgement   ADL Overall ADL's : Needs assistance/impaired Eating/Feeding: Set up;Sitting Eating/Feeding Details (indicate cue type and reason): using LUE, assist to open containers                     Toilet Transfer: Moderate assistance;Stand-pivot;RW;BSC Toilet Transfer Details (indicate cue type and reason):  Mod A for maintaining balance and advancing RW to [facility]. cues for pacing due to quick movements with urinary urgency Toileting- Clothing Manipulation and Hygiene: Minimal assistance;Sitting/lateral lean;Sit to/from stand Toileting - Clothing Manipulation Details (indicate cue type and reason): Min A for clothing mgmt, able to perform anterior peri care after urination. will likely need more assist for posterior hygiene after BM        General ADL Comments: limited by decreased endurance, decreased use of R dominant UE and increasing back pain with activity. Encouraged continued elevation and AROM of digits to decrease swelling     Vision   Vision Assessment?: No apparent visual deficits   Perception     Praxis      Cognition Arousal/Alertness: Awake/alert Behavior During Therapy: WFL for tasks assessed/performed Overall Cognitive Status: Impaired/Different from baseline Area of Impairment: Following commands;Problem solving;Memory                       Following Commands: Follows one step commands with increased time;Follows multi-step commands with increased time     Problem Solving: Requires verbal cues;Requires tactile cues          Exercises     Shoulder Instructions       General Comments VSS on RA. husband present initially but left. daughter present throughout session.    Pertinent Vitals/ Pain       Pain Assessment: Faces Faces Pain Scale: Hurts even more Pain Location: low back Pain Descriptors / Indicators: Grimacing;Aching Pain Intervention(s): Monitored during session;Repositioned;Limited activity within patient's tolerance  Home Living                                          Prior Functioning/Environment              Frequency  Min 2X/week        Progress Toward Goals  OT Goals(current goals can now be found in the care plan section)  Progress towards OT goals: Progressing toward goals  Acute Rehab OT Goals Patient Stated Goal: feel better OT Goal Formulation: With patient Time For Goal Achievement: 08/31/20 Potential to Achieve Goals: Good ADL Goals Pt Will Perform Eating: with modified independence;sitting Pt Will Perform Grooming: with min assist;sitting Pt Will Transfer to Toilet: with min assist;stand pivot transfer;bedside commode Pt/caregiver will Perform Home Exercise Program: Increased ROM;Both right and left upper  extremity;With minimal assist;With written HEP provided Additional ADL Goal #1: Pt will perform bed mobility with minA in order to increase independence for OOB ADL. Additional ADL Goal #2: Pt will follow 1-2 step commands with minimal cues to attend to task.  Plan Discharge plan remains appropriate    Co-evaluation                 AM-PAC OT "6 Clicks" Daily Activity     Outcome Measure   Help from another person eating meals?: A Little Help from another person taking care of personal grooming?: A Little Help from another person toileting, which includes using toliet, bedpan, or urinal?: A Little Help from another person bathing (including washing, rinsing, drying)?: A Lot Help from another person to put on and taking off regular upper body clothing?: A Lot Help from another person to put on and taking off regular lower body clothing?: A Little 6 Click Score: 16    End of Session Equipment Utilized During Treatment:  Gait belt;Rolling walker  OT Visit Diagnosis: Unsteadiness on feet (R26.81);Muscle weakness (generalized) (M62.81);Pain;Other symptoms and signs involving cognitive function Pain - part of body:  (back)   Activity Tolerance Patient tolerated treatment well   Patient Left in chair;with call bell/phone within reach;with chair alarm set;with family/visitor present   Nurse Communication Mobility status        Time: 1050-1130 OT Time Calculation (min): 40 min  Charges: OT General Charges $OT Visit: 1 Visit OT Treatments $Self Care/Home Management : 23-37 mins $Therapeutic Activity: 8-22 mins  Malachy Chamber, OTR/L Acute Rehab Services Office: [phone]   Ruth Riley 08/26/2020, 12:32 PM

## 2020-08-26 NOTE — TOC Progression Note (Signed)
Transition of Care [facility]) - Progression Note    Patient Details  Name: Ruth Riley MRN: [id number] Date of Birth: 1945/11/18  Transition of Care [facility]) CM/SW [place], LCSW Phone Number: 08/26/2020, 2:31 PM  Clinical Narrative:   CSW received word form [facility] that they are unable to afford the IV Eraxis. CSW also asked about alternative options per ID such as caspofungin or micafungin but they state they also cannot afford those meds. CSW also checked with [facility] to make sure it is not just [facility] that cannot obtain the medications, but [facility] also stated the costs of those meds are too much for SNF.   Expected Discharge Plan: [place] Barriers to Discharge: Continued Medical Work up  Expected Discharge Plan and Services Expected Discharge Plan: [place] In-house Referral: Clinical Social Work Discharge Planning Services: CM Consult Post Acute Care Choice: [place] Living arrangements for the past 2 months: Apartment                                       Social Determinants of Health (SDOH) Interventions    Readmission Risk Interventions No flowsheet data found.

## 2020-08-26 NOTE — Progress Notes (Addendum)
PROGRESS NOTE        PATIENT DETAILS Name: Ruth Riley Age: 75 y.o. Sex: female Date of Birth: 10-17-1945 Admit Date: 08/16/2020 Admitting Physician Karmen Bongo, MD LKH:[id number], [name], MD  Brief Narrative: Patient is a 75 y.o. female with history of renal transplant, CKD stage IV, DM-2, HTN, HLD-presented to the hospital with multiple orthopedic complaints-s/p multiple falls with both right/left hand injury, confusion, worsening anemia-she was subsequently transferred from [facility] to [facility] for GI evaluation.  See below for further details.   Significant events: 4/5>> transferred from [facility] to [facility]  Significant studies: 4/6>> chest x-ray: No active disease. 4/8>> x-ray left hand: No acute fracture or dislocation. 4/8>> MRI right hand/wrist: No fracture/osteomyelitis-severe flexor Tina synovitis, second/third MCP joint erosions, enlarged radiocarpal midcarpal joint infusion-suggestive of inflammatory arthropathy. 4/8>> Echo: EF 03-70%, grade 2 diastolic dysfunction 9/64>> CT abdomen/pelvis: No perforation/focal inflammation in the bowel-autosomal dominant polycystic kidney disease.  Mild anasarca. 4/13>> TEE: Vegetation on MV, TV, AV, linear mobile clot in RA cavity  Antimicrobial therapy: Rocephin: 4/6>> 4/8 Zithromax: 4/6>> 4/10 Cefdinir: 4/9>> 4/10 Eraxis: 04/7>>  Microbiology data: 4/5>> blood culture: Candida albicans 4/7>> blood culture: No growth  Pathology: 4/12>> ascending colon biopsy: No inflammation/dysplasia/carcinoma  Procedures : 4/11>> right IJ tunneled CVC 4/12>> EGD: 2 angiectasia is in the stomach-1 with active oozing-s/p APC. 4/12>> colonoscopy: Edema/adherent blood in the proximal ascending colon/cecum-?  Ischemic colitis.  Diverticulosis in the sigmoid/descending colon. 4/13>> TEE: Vegetation on MV, TV, AV, linear mobile clot in RA cavity  Consults: GI, ID, IR, cards,CTVS  DVT Prophylaxis  : SCDs Start: 08/16/20 1429   Subjective: Lying comfortably in bed-no major issues overnight-no chest pain or shortness of breath.  Anxious about cardiothoracic surgery consultation-she is not keen on any sort of surgery.   Assessment/Plan: Acute metabolic encephalopathy: Probably related to fungemia/pneumonia-resolved-suspect her mentation is back to baseline.  Multifocal pneumonia-recent history of COVID-19 infection: Overall improved-completed course of antibiotics.  Cultures as above.  Fungemia with endocarditis involving the mitral, tricuspid and aortic valve On Eraxis-repeat blood cultures negative-ID following-awaiting CT VSA input but patient likely not a candidate for surgery  Right atrial thrombus: Discussed with cardiologist-Dr. Doylene Canard on 4/14-unlikely that this is related to central line-as the central line was just placed a day or so before the TEE-after extensive discussion with patient/spouse-started on IV heparin on 4/13-seems to be tolerating it pretty well without any obvious evidence of bleeding.  CBC remains stable.  Dr. Doylene Canard recommends at least 1 month of anticoagulation-he suggests that if renal function tolerates-switching to SQ Lovenox when closer to discharge.  Unable to do Coumadin as patient is on Bactrim chronically.  Upper GI bleeding with acute blood loss anemia: EGD/colonoscopy as above-no further obvious GI loss apparent-has required PRBC transfusion on 4/6-CBC currently stable.  Given that a right atrial thrombus was seen on TEE on 4/13-has been started on IV heparin-no recurrence of GI bleeding while on anticoagulation so far.  Remains on PPI-CBC stable.  Biopsy from colonoscopy was negative for malignancy or inflammation.  AKI on CKD stage IV-renal transplantation: AKI hemodynamically mediated-has trended down-and now close to baseline.   History of renal transplantation: Continue steroids/tacrolimus-on prophylactic Bactrim.  HTN: BP controlled-continue  Coreg  HLD: Continue statin  DM-2: CBGs stable-watch closely for hypoglycemia-continue Lantus 10 units daily and SSI.   Recent Labs  08/25/20 1645 08/25/20 1940 08/26/20 0746  GLUCAP 195* 155* 85   Right wrist pain/swelling: See above regarding MRI imaging-prior MD discussed with hand surgery-Dr. Hilbert Corrigan thought that this was related to inflammatory changes-recommendations were to keep splint for extended periods and steroids.  Right wrist swelling seems to have improved compared to the past few days.  Chronic back pain: Continue supportive care.  Deconditioning/debility: Due to acute illness-SNF on discharge.   Diet: Diet Order            Diet Carb Modified Fluid consistency: Thin; Room service appropriate? Yes  Diet effective now                  Code Status: DNR  Family Communication: Spouse-David Moldovan-[phone]-updated over the phone on 4/14.   Disposition Plan:  Status is: Inpatient  Remains inpatient appropriate because:Inpatient level of care appropriate due to severity of illness   Dispo: The patient is from: Home              Anticipated d/c is to: Home              Patient currently is not medically stable to d/c.   Difficult to place patient No   Barriers to Discharge: Fungemia-on Eraxis-on IV heparin to ensure no more GI bleeding before transitioning to SQ Lovenox.  Also awaiting SNF bed.  Antimicrobial agents: Anti-infectives (From admission, onward)   Start     Dose/Rate Route Frequency Ordered Stop   08/25/20 1000  anidulafungin (ERAXIS) 200 mg in sodium chloride 0.9 % 200 mL IVPB        200 mg 78 mL/hr over 200 Minutes Intravenous Daily 08/24/20 1445 09/01/20 0959   08/20/20 1000  cefdinir (OMNICEF) capsule 300 mg  Status:  Discontinued        300 mg Oral Daily 08/19/20 0918 08/19/20 0936   08/20/20 1000  cefdinir (OMNICEF) capsule 300 mg        300 mg Oral Daily 08/19/20 0936 08/21/20 0922   08/19/20 1015  azithromycin  (ZITHROMAX) tablet 500 mg  Status:  Discontinued        500 mg Oral Daily 08/19/20 0916 08/19/20 0936   08/19/20 1015  azithromycin (ZITHROMAX) tablet 500 mg        500 mg Oral Daily 08/19/20 0936 08/21/20 0921   08/19/20 1000  anidulafungin (ERAXIS) 100 mg in sodium chloride 0.9 % 100 mL IVPB  Status:  Discontinued        100 mg 78 mL/hr over 100 Minutes Intravenous Daily 08/18/20 0247 08/24/20 1445   08/18/20 0400  anidulafungin (ERAXIS) 200 mg in sodium chloride 0.9 % 200 mL IVPB        200 mg 78 mL/hr over 200 Minutes Intravenous  Once 08/18/20 0247 08/18/20 0704   08/17/20 0900  sulfamethoxazole-trimethoprim (BACTRIM) 400-80 MG per tablet 1 tablet        1 tablet Oral Once per day on Mon Wed Fri 08/16/20 1430     08/17/20 0800  cefTRIAXone (ROCEPHIN) 2 g in sodium chloride 0.9 % 100 mL IVPB  Status:  Discontinued        2 g 200 mL/hr over 30 Minutes Intravenous Every 24 hours 08/16/20 1430 08/19/20 0918   08/17/20 0800  azithromycin (ZITHROMAX) 500 mg in sodium chloride 0.9 % 250 mL IVPB  Status:  Discontinued        500 mg 250 mL/hr over 60 Minutes Intravenous Every 24 hours 08/16/20 1430 08/19/20 0916  Time spent: 25 minutes-Greater than 50% of this time was spent in counseling, explanation of diagnosis, planning of further management, and coordination of care.  MEDICATIONS: Scheduled Meds: . carvedilol  25 mg Oral BID  . Chlorhexidine Gluconate Cloth  6 each Topical Daily  . hydrALAZINE  25 mg Oral Q8H  . insulin aspart  0-9 Units Subcutaneous TID WC  . insulin glargine  10 Units Subcutaneous QHS  . pantoprazole  40 mg Oral BID  . peg 3350 powder  0.5 kit Oral Once  . predniSONE  10 mg Oral Q breakfast  . rOPINIRole  3 mg Oral QHS  . rosuvastatin  20 mg Oral Daily  . sertraline  100 mg Oral QHS  . sodium bicarbonate  1,300 mg Oral BID  . sulfamethoxazole-trimethoprim  1 tablet Oral Once per day on Mon Wed Fri  . tacrolimus  1 mg Oral TID   Continuous  Infusions: . sodium chloride 10 mL/hr at 08/24/20 1450  . anidulafungin 200 mg (08/26/20 0855)  . heparin 1,000 Units/hr (08/26/20 0319)   PRN Meds:.acetaminophen **OR** acetaminophen, albuterol, calcium carbonate (dosed in mg elemental calcium), camphor-menthol **AND** hydrOXYzine, docusate sodium, feeding supplement (NEPRO CARB STEADY), guaiFENesin, hydrALAZINE, HYDROcodone-acetaminophen, lidocaine, lip balm, ondansetron **OR** ondansetron (ZOFRAN) IV, oxyCODONE, polyethylene glycol, sorbitol, zolpidem   PHYSICAL EXAM: Vital signs: Vitals:   08/26/20 0021 08/26/20 0322 08/26/20 0400 08/26/20 0609  BP: (!) 141/76 (!) 178/85 (!) 152/83 (!) 171/116  Pulse: 73 65 70 71  Resp: 20 13    Temp: 97.8 F (36.6 C) (!) 97.3 F (36.3 C)    TempSrc: Axillary Oral    SpO2: 92% 96%    Weight:      Height:       Filed Weights   08/20/20 0339 08/21/20 0347 08/24/20 1205  Weight: 57 kg 58 kg 58 kg   Body mass index is 24.16 kg/m.   Gen Exam:Alert awake-not in any distress HEENT:atraumatic, normocephalic Chest: B/L clear to auscultation anteriorly CVS:S1S2 regular Abdomen:soft non tender, non distended Extremities:no edema Neurology: Non focal Skin: no rash  I have personally reviewed following labs and imaging studies  LABORATORY DATA: CBC: Recent Labs  Lab 08/20/20 0030 08/21/20 0018 08/22/20 0045 08/25/20 0919 08/26/20 0047  WBC 3.3* 4.1 4.7 5.4 5.5  HGB 8.5* 9.0* 9.3* 9.1* 9.0*  HCT 26.2* 28.1* 29.9* 28.8* 28.0*  MCV 88.2 88.4 89.3 88.6 86.7  PLT 128* 140* 148* 184 146*    Basic Metabolic Panel: Recent Labs  Lab 08/20/20 0030 08/21/20 0018 08/22/20 0045 08/25/20 0452 08/26/20 0047  NA 133* 134* 132* 138 133*  K 3.7 3.7 4.3 3.6 4.2  CL 104 104 104 107 108  CO2 22 24 20* 23 18*  GLUCOSE 189* 155* 261* 79 119*  BUN 53* 50* 52* 34* 36*  CREATININE 3.17* 3.08* 3.01* 2.53* 2.57*  CALCIUM 7.6* 7.9* 7.6* 8.1* 7.8*    GFR: Estimated Creatinine Clearance: 16 mL/min  (A) (by C-G formula based on SCr of 2.57 mg/dL (H)).  Liver Function Tests: No results for input(s): AST, ALT, ALKPHOS, BILITOT, PROT, ALBUMIN in the last 168 hours. No results for input(s): LIPASE, AMYLASE in the last 168 hours. No results for input(s): AMMONIA in the last 168 hours.  Coagulation Profile: No results for input(s): INR, PROTIME in the last 168 hours.  Cardiac Enzymes: No results for input(s): CKTOTAL, CKMB, CKMBINDEX, TROPONINI in the last 168 hours.  BNP (last 3 results) No results for input(s): PROBNP in the last 8760  hours.  Lipid Profile: No results for input(s): CHOL, HDL, LDLCALC, TRIG, CHOLHDL, LDLDIRECT in the last 72 hours.  Thyroid Function Tests: No results for input(s): TSH, T4TOTAL, FREET4, T3FREE, THYROIDAB in the last 72 hours.  Anemia Panel: No results for input(s): VITAMINB12, FOLATE, FERRITIN, TIBC, IRON, RETICCTPCT in the last 72 hours.  Urine analysis: No results found for: COLORURINE, APPEARANCEUR, LABSPEC, PHURINE, GLUCOSEU, HGBUR, BILIRUBINUR, KETONESUR, PROTEINUR, UROBILINOGEN, NITRITE, LEUKOCYTESUR  Sepsis Labs: Lactic Acid, Venous No results found for: LATICACIDVEN  MICROBIOLOGY: Recent Results (from the past 240 hour(s))  Culture, blood (routine x 2) Call MD if unable to obtain prior to antibiotics being given     Status: Abnormal (Preliminary result)   Collection Time: 08/16/20  3:50 PM   Specimen: BLOOD  Result Value Ref Range Status   Specimen Description BLOOD RIGHT ANTECUBITAL  Final   Special Requests   Final    BOTTLES DRAWN AEROBIC AND ANAEROBIC Blood Culture results may not be optimal due to an inadequate volume of blood received in culture bottles   Culture  Setup Time (A)  Final    YEAST AEROBIC BOTTLE ONLY CRITICAL RESULT CALLED TO, READ BACK BY AND VERIFIED WITH: PHARMD ABBOTT [id number] FCP    Culture (A)  Final    CANDIDA ALBICANS Sent to [facility] for further susceptibility testing. Performed at [facility], [place] [address] [place], [place] [address]    Report Status PENDING  Incomplete  Blood Culture ID Panel (Reflexed)     Status: Abnormal   Collection Time: 08/16/20  3:50 PM  Result Value Ref Range Status   Enterococcus faecalis NOT DETECTED NOT DETECTED Final   Enterococcus Faecium NOT DETECTED NOT DETECTED Final   Listeria monocytogenes NOT DETECTED NOT DETECTED Final   Staphylococcus species NOT DETECTED NOT DETECTED Final   Staphylococcus aureus (BCID) NOT DETECTED NOT DETECTED Final   Staphylococcus epidermidis NOT DETECTED NOT DETECTED Final   Staphylococcus lugdunensis NOT DETECTED NOT DETECTED Final   Streptococcus species NOT DETECTED NOT DETECTED Final   Streptococcus agalactiae NOT DETECTED NOT DETECTED Final   Streptococcus pneumoniae NOT DETECTED NOT DETECTED Final   Streptococcus pyogenes NOT DETECTED NOT DETECTED Final   A.calcoaceticus-baumannii NOT DETECTED NOT DETECTED Final   Bacteroides fragilis NOT DETECTED NOT DETECTED Final   Enterobacterales NOT DETECTED NOT DETECTED Final   Enterobacter cloacae complex NOT DETECTED NOT DETECTED Final   Escherichia coli NOT DETECTED NOT DETECTED Final   Klebsiella aerogenes NOT DETECTED NOT DETECTED Final   Klebsiella oxytoca NOT DETECTED NOT DETECTED Final   Klebsiella pneumoniae NOT DETECTED NOT DETECTED Final   Proteus species NOT DETECTED NOT DETECTED Final   Salmonella species NOT DETECTED NOT DETECTED Final   Serratia marcescens NOT DETECTED NOT DETECTED Final   Haemophilus influenzae NOT DETECTED NOT DETECTED Final   Neisseria meningitidis NOT DETECTED NOT DETECTED Final   Pseudomonas aeruginosa NOT DETECTED NOT DETECTED Final   Stenotrophomonas maltophilia NOT DETECTED NOT DETECTED Final   Candida albicans DETECTED (A) NOT DETECTED Final    Comment: CRITICAL RESULT CALLED TO, READ BACK BY AND VERIFIED WITH: PHARMD ABBOTT [phone] [id number] FCP    Candida auris NOT DETECTED NOT DETECTED Final   Candida glabrata NOT  DETECTED NOT DETECTED Final   Candida krusei NOT DETECTED NOT DETECTED Final   Candida parapsilosis NOT DETECTED NOT DETECTED Final   Candida tropicalis NOT DETECTED NOT DETECTED Final   Cryptococcus neoformans/gattii NOT DETECTED NOT DETECTED Final    Comment: Performed  at [facility], [place] [address], [place], [place] [address]  Antifungal AST 9 Drug Panel     Status: None   Collection Time: 08/16/20  3:50 PM  Result Value Ref Range Status   Organism ID, Yeast Candida albicans  Corrected    Comment: (NOTE) Identification performed by account, not confirmed by this laboratory. CORRECTED ON 04/14 AT 1238: PREVIOUSLY REPORTED AS Preliminary report    Amphotericin B MIC 1.0 ug/mL  Final    Comment: (NOTE) *Breakpoints have been established for only some organism-drug combinations as indicated. Results of this test are labeled for research purposes only by the assay's manufacturer. The performance characteristics of this assay have not been established by the manufacturer. The result should not be used for treatment or for diagnostic purposes without confirmation of the diagnosis by another medically established diagnostic product or procedure. The performance characteristics were determined by [facility].    Please Note: PENDING  Incomplete   Anidulafungin MIC Comment  Final    Comment: (NOTE) 0.03 ug/mL Susceptible *Breakpoints have been established for only some organism-drug combinations as indicated. Results of this test are labeled for research purposes only by the assay's manufacturer. The performance characteristics of this assay have not been established by the manufacturer. The result should not be used for treatment or for diagnostic purposes without confirmation of the diagnosis by another medically established diagnostic product or procedure. The performance characteristics were determined by [facility].    Caspofungin MIC Comment  Final    Comment: 0.06 ug/mL  Susceptible   Micafungin MIC Comment  Final    Comment: 0.016 ug/mL Susceptible   Posaconazole MIC 0.03 ug/mL  Final    Comment: (NOTE) *Breakpoints have been established for only some organism-drug combinations as indicated. Results of this test are labeled for research purposes only by the assay's manufacturer. The performance characteristics of this assay have not been established by the manufacturer. The result should not be used for treatment or for diagnostic purposes without confirmation of the diagnosis by another medically established diagnostic product or procedure. The performance characteristics were determined by [facility].    Please Note: PENDING  Incomplete   Fluconazole Islt MIC Comment  Final    Comment: 0.25 ug/mL Susceptible   Flucytosine MIC 1.0 ug/mL  Final   Itraconazole MIC 0.06 ug/mL  Final   Voriconazole MIC Comment  Final    Comment: (NOTE) 0.008 ug/mL or less, Susceptible Performed At: [facility] [address] [place], [place] [id number] Rush Farmer MD EH:[id number]    Source CANDIDA ALBICANS/ BLOOD  Final    Comment: Performed at [facility], [place] [address], [place], [place] [address]  Culture, blood (routine x 2) Call MD if unable to obtain prior to antibiotics being given     Status: Abnormal   Collection Time: 08/16/20  3:52 PM   Specimen: BLOOD LEFT ARM  Result Value Ref Range Status   Specimen Description BLOOD LEFT ARM  Final   Special Requests   Final    BOTTLES DRAWN AEROBIC AND ANAEROBIC Blood Culture results may not be optimal due to an inadequate volume of blood received in culture bottles   Culture  Setup Time (A)  Final    YEAST AEROBIC BOTTLE ONLY CRITICAL VALUE NOTED.  VALUE IS CONSISTENT WITH PREVIOUSLY REPORTED AND CALLED VALUE. Performed at [facility], [place] [address], [place],  [address]    Culture CANDIDA ALBICANS (A)  Final   Report Status 08/21/2020  FINAL  Final  Culture, blood (routine x 2)      Status: None   Collection Time: 08/18/20  7:51 AM   Specimen: BLOOD  Result Value Ref Range Status   Specimen Description BLOOD RIGHT ANTECUBITAL  Final   Special Requests   Final    BOTTLES DRAWN AEROBIC AND ANAEROBIC Blood Culture adequate volume   Culture   Final    NO GROWTH 5 DAYS Performed at [facility], [address] [place], [place] [address]    Report Status 08/23/2020 FINAL  Final  Culture, blood (routine x 2)     Status: None   Collection Time: 08/18/20  8:03 AM   Specimen: BLOOD LEFT WRIST  Result Value Ref Range Status   Specimen Description BLOOD LEFT WRIST  Final   Special Requests   Final    BOTTLES DRAWN AEROBIC AND ANAEROBIC Blood Culture adequate volume   Culture   Final    NO GROWTH 5 DAYS Performed at [facility], [place] [address] [place], [place] [address]    Report Status 08/23/2020 FINAL  Final    RADIOLOGY STUDIES/RESULTS: ECHO TEE  Result Date: 08/24/2020    TRANSESOPHOGEAL ECHO REPORT   Patient Name:   Ruth Riley [facility] Date of Exam: 08/24/2020 Medical Rec #:  [id number]              Height:       61.0 in Accession #:    [id number]             Weight:       127.9 lb Date of Birth:  06-02-1945              BSA:          1.562 m Patient Age:    67 years               BP:           159/96 mmHg Patient Gender: F                      HR:           68 bpm. Exam Location:  Inpatient Procedure: Transesophageal Echo, Cardiac Doppler, Color Doppler and Saline            Contrast Bubble Study Indications:     Bacteremia  History:         Patient has prior history of Echocardiogram examinations, most                  recent 08/19/2020. Signs/Symptoms:Bacteremia; Risk                  Factors:Diabetes, Hypertension and Dyslipidemia. PNA, COVID+,                  kidney transplant.  Sonographer:     Dustin Flock RDCS Referring Phys:  [place] Diagnosing Phys: Dixie Dials MD PROCEDURE: The transesophogeal probe was passed without difficulty  through the esophogus of the patient. Sedation performed by different physician. The patient was monitored while under deep sedation. Anesthestetic sedation was provided intravenously by Anesthesiology: 125.20m of Propofol. Image quality was good. The patient developed no complications during the procedure. IMPRESSIONS  1. Left ventricular ejection fraction, by estimation, is 55 to 60%. The left ventricle has normal function. The left ventricle has no regional wall motion abnormalities.  2. Right ventricular systolic function is normal. The right ventricular  size is normal.  3. Left atrial size was moderately dilated. No left atrial/left atrial appendage thrombus was detected.  4. Possible large linear clot.  5. The pericardial effusion is circumferential. There is no evidence of cardiac tamponade.  6. Multiple small vegetation on the mitral valve.  7. The mitral valve is degenerative. Mild mitral valve regurgitation.  8. Aortic valve vegetation is visualized on the left.  9. The aortic valve is tricuspid. There is moderate calcification of the aortic valve. There is mild thickening of the aortic valve. Aortic valve regurgitation is mild. Mild to moderate aortic valve sclerosis/calcification is present, without any evidence of aortic stenosis. 10. There is mild (Grade II) atheroma plaque involving the aortic root, ascending aorta and descending aorta. 11. The inferior vena cava is normal in size with <50% respiratory variability, suggesting right atrial pressure of 8 mmHg. 12. Agitated saline contrast bubble study was positive with shunting observed after >6 cardiac cycles suggestive of intrapulmonary shunting. Conclusion(s)/Recommendation(s): Findings are concerning for vegetation/infective endocarditis as detailed above. FINDINGS  Left Ventricle: Left ventricular ejection fraction, by estimation, is 55 to 60%. The left ventricle has normal function. The left ventricle has no regional wall motion abnormalities.  The left ventricular internal cavity size was normal in size. Right Ventricle: The right ventricular size is normal. No increase in right ventricular wall thickness. Right ventricular systolic function is normal. Left Atrium: Left atrial size was moderately dilated. Spontaneous echo contrast was present in the left atrium. No left atrial/left atrial appendage thrombus was detected. Right Atrium: Right atrial size was normal in size. Prominent Eustachian valve and Possible large linear clot. Pericardium: Trivial pericardial effusion is present. The pericardial effusion is circumferential. There is no evidence of cardiac tamponade. Mitral Valve: The mitral valve is degenerative in appearance. There is mild thickening of the mitral valve leaflet(s). There is mild calcification of the mitral valve leaflet(s). Normal mobility of the mitral valve leaflets. Mild to moderate mitral annular calcification. A multiple small vegetation is seen on the anterior and posterior mitral leaflet. Mild mitral valve regurgitation. Tricuspid Valve: Small, mobile, multiple TV vegetation seen. The tricuspid valve is normal in structure. Tricuspid valve regurgitation is mild. Aortic Valve: The aortic valve is tricuspid. There is moderate calcification of the aortic valve. There is mild thickening of the aortic valve. There is moderate aortic valve annular calcification. Aortic valve regurgitation is mild. Mild to moderate aortic valve sclerosis/calcification is present, without any evidence of aortic stenosis. A mobile vegetation is seen on the left. Pulmonic Valve: The pulmonic valve was normal in structure. Pulmonic valve regurgitation is not visualized. Aorta: The aortic root is normal in size and structure. There is mild (Grade II) atheroma plaque involving the aortic root, ascending aorta and descending aorta. Venous: The left upper pulmonary vein, left lower pulmonary vein, right upper pulmonary vein and right lower pulmonary vein are  normal. The inferior vena cava is normal in size with less than 50% respiratory variability, suggesting right atrial pressure of 8 mmHg. IAS/Shunts: The atrial septum is grossly normal. Agitated saline contrast was given intravenously to evaluate for intracardiac shunting. Agitated saline contrast bubble study was positive with shunting observed after >6 cardiac cycles suggestive of intrapulmonary shunting. There is no evidence of a patent foramen ovale. No ventricular septal defect is seen or detected. There is no evidence of an atrial septal defect. Additional Comments: There is a small pleural effusion in both left and right lateral regions. Dixie Dials MD Electronically signed by Vicenta Aly  Doylene Canard MD Signature Date/Time: 08/24/2020/5:54:46 PM    Final      LOS: 9 days   Oren Binet, MD  [facility]    To contact the attending provider between 7A-7P or the covering provider during after hours 7P-7A, please log into the web site [email or web address] and access using universal [place] password for that web site. If you do not have the password, please call the hospital operator.  08/26/2020, 11:23 AM

## 2020-08-26 NOTE — Anesthesia Postprocedure Evaluation (Signed)
Anesthesia Post Note  Patient: Ruth Riley  Procedure(s) Performed: ESOPHAGOGASTRODUODENOSCOPY (EGD) WITH PROPOFOL (N/A ) HOT HEMOSTASIS (ARGON PLASMA COAGULATION/BICAP) (N/A ) BIOPSY HEMOSTASIS CLIP PLACEMENT COLONOSCOPY (N/A )     Patient location during evaluation: Endoscopy Anesthesia Type: MAC Level of consciousness: awake and alert Pain management: pain level controlled Vital Signs Assessment: post-procedure vital signs reviewed and stable Respiratory status: spontaneous breathing, nonlabored ventilation, respiratory function stable and patient connected to nasal cannula oxygen Cardiovascular status: stable and blood pressure returned to baseline Postop Assessment: no apparent nausea or vomiting Anesthetic complications: no   No complications documented.  Last Vitals:  Vitals:   08/26/20 0400 08/26/20 0609  BP: (!) 152/83 (!) 171/116  Pulse: 70 71  Resp:    Temp:    SpO2:      Last Pain:  Vitals:   08/26/20 0720  TempSrc:   PainSc: Asleep                 Day Deery

## 2020-08-26 NOTE — Progress Notes (Signed)
[facility]  Date of Admission:  08/16/2020     Total days of antibiotics 10         ASSESSMENT:  Ms. Lacey Jensen has a complex medical situation with history of renal transplant and now with GI bleed and fungal prosthetic endocarditis. No plans for surgical intervention per CVTS. Discussed the plan of care to include 6 weeks of IV antifungal medication with Eraxis followed by long term suppression with fluconazole. Bleeding appears to be stable and controlled. Families questions answered. Initial plans were for discharge to SNF, however they are not accepting of Eraxis. Oral fluconazole would not be ideal in the current situation and may need to consider longer acute hospital stay to get Eraxis.   PLAN:  1. Continue Eraxis.  2. Will need tunneled central line if being discharged to SNF.  3. Final disposition per primary team and may need to stay in the hospital for treatment.   Principal Problem:   Fungal endocarditis Active Problems:   Anemia in chronic kidney disease   Acute metabolic encephalopathy   Diabetes mellitus type 2 in nonobese [facility])   Essential hypertension   H/O kidney transplant   Stage 4 chronic kidney disease (HCC)   Bilateral wrist pain   Multifocal pneumonia   History of COVID-19   Chronic back pain   Blood in the stool   Heme positive stool   Acute on chronic anemia   Inflammation of colonic mucosa   Angiodysplasia of stomach with hemorrhage   . carvedilol  25 mg Oral BID  . Chlorhexidine Gluconate Cloth  6 each Topical Daily  . hydrALAZINE  25 mg Oral Q8H  . insulin aspart  0-9 Units Subcutaneous TID WC  . insulin glargine  10 Units Subcutaneous QHS  . pantoprazole  40 mg Oral BID  . peg 3350 powder  0.5 kit Oral Once  . predniSONE  10 mg Oral Q breakfast  . rOPINIRole  3 mg Oral QHS  . rosuvastatin  20 mg Oral Daily  . sertraline  100 mg Oral QHS  . sodium bicarbonate  1,300 mg Oral BID  .  sulfamethoxazole-trimethoprim  1 tablet Oral Once per day on Mon Wed Fri  . tacrolimus  1 mg Oral TID    SUBJECTIVE:  Afebrile overnight with no acute events. Family at beside with several questions regarding plan of care.    Allergies  Allergen Reactions  . Penicillins Hives and Other (See Comments)  . Tramadol Other (See Comments) and Palpitations    Patient reports it makes her feel spaced out Patient reports it makes her feel spaced out Patient reports it makes her feel spaced out Patient reports it makes her feel spaced out   . Donepezil Diarrhea and Other (See Comments)  . Amlodipine Swelling    dizziness     Review of Systems: Review of Systems  Constitutional: Negative for chills, fever and weight loss.  Respiratory: Negative for cough, shortness of breath and wheezing.   Cardiovascular: Negative for chest pain and leg swelling.  Gastrointestinal: Negative for abdominal pain, constipation, diarrhea, nausea and vomiting.  Skin: Negative for rash.      OBJECTIVE: Vitals:   08/26/20 0021 08/26/20 0322 08/26/20 0400 08/26/20 0609  BP: (!) 141/76 (!) 178/85 (!) 152/83 (!) 171/116  Pulse: 73 65 70 71  Resp: 20 13    Temp: 97.8 F (36.6 C) (!) 97.3 F (36.3 C)    TempSrc: Axillary Oral  SpO2: 92% 96%    Weight:      Height:       Body mass index is 24.16 kg/m.  Physical Exam Constitutional:      General: She is not in acute distress.    Appearance: She is well-developed.     Comments: Lying in bed with head of bed elevated; sleepy.   Cardiovascular:     Rate and Rhythm: Normal rate and regular rhythm.     Heart sounds: Normal heart sounds.  Pulmonary:     Effort: Pulmonary effort is normal.     Breath sounds: Normal breath sounds.  Skin:    General: Skin is warm and dry.  Neurological:     Mental Status: She is oriented to person, place, and time.  Psychiatric:        Mood and Affect: Mood normal.     Lab Results Lab Results  Component Value  Date   WBC 5.5 08/26/2020   HGB 9.0 (L) 08/26/2020   HCT 28.0 (L) 08/26/2020   MCV 86.7 08/26/2020   PLT 146 (L) 08/26/2020    Lab Results  Component Value Date   CREATININE 2.57 (H) 08/26/2020   BUN 36 (H) 08/26/2020   NA 133 (L) 08/26/2020   K 4.2 08/26/2020   CL 108 08/26/2020   CO2 18 (L) 08/26/2020    Lab Results  Component Value Date   ALT 16 08/19/2020   AST 16 08/19/2020   ALKPHOS 62 08/19/2020   BILITOT 0.4 08/19/2020     Microbiology: Recent Results (from the past 240 hour(s))  Culture, blood (routine x 2) Call MD if unable to obtain prior to antibiotics being given     Status: Abnormal (Preliminary result)   Collection Time: 08/16/20  3:50 PM   Specimen: BLOOD  Result Value Ref Range Status   Specimen Description BLOOD RIGHT ANTECUBITAL  Final   Special Requests   Final    BOTTLES DRAWN AEROBIC AND ANAEROBIC Blood Culture results may not be optimal due to an inadequate volume of blood received in culture bottles   Culture  Setup Time (A)  Final    YEAST AEROBIC BOTTLE ONLY CRITICAL RESULT CALLED TO, READ BACK BY AND VERIFIED WITH: PHARMD ABBOTT [id number] FCP    Culture (A)  Final    CANDIDA ALBICANS Sent to [place] for further susceptibility testing. Performed at [facility], [place] [address] [place], [place] [address]    Report Status PENDING  Incomplete  Blood Culture ID Panel (Reflexed)     Status: Abnormal   Collection Time: 08/16/20  3:50 PM  Result Value Ref Range Status   Enterococcus faecalis NOT DETECTED NOT DETECTED Final   Enterococcus Faecium NOT DETECTED NOT DETECTED Final   Listeria monocytogenes NOT DETECTED NOT DETECTED Final   Staphylococcus species NOT DETECTED NOT DETECTED Final   Staphylococcus aureus (BCID) NOT DETECTED NOT DETECTED Final   Staphylococcus epidermidis NOT DETECTED NOT DETECTED Final   Staphylococcus lugdunensis NOT DETECTED NOT DETECTED Final   Streptococcus species NOT DETECTED NOT DETECTED Final    Streptococcus agalactiae NOT DETECTED NOT DETECTED Final   Streptococcus pneumoniae NOT DETECTED NOT DETECTED Final   Streptococcus pyogenes NOT DETECTED NOT DETECTED Final   A.calcoaceticus-baumannii NOT DETECTED NOT DETECTED Final   Bacteroides fragilis NOT DETECTED NOT DETECTED Final   Enterobacterales NOT DETECTED NOT DETECTED Final   Enterobacter cloacae complex NOT DETECTED NOT DETECTED Final   Escherichia coli NOT DETECTED NOT DETECTED Final  Klebsiella aerogenes NOT DETECTED NOT DETECTED Final   Klebsiella oxytoca NOT DETECTED NOT DETECTED Final   Klebsiella pneumoniae NOT DETECTED NOT DETECTED Final   Proteus species NOT DETECTED NOT DETECTED Final   Salmonella species NOT DETECTED NOT DETECTED Final   Serratia marcescens NOT DETECTED NOT DETECTED Final   Haemophilus influenzae NOT DETECTED NOT DETECTED Final   Neisseria meningitidis NOT DETECTED NOT DETECTED Final   Pseudomonas aeruginosa NOT DETECTED NOT DETECTED Final   Stenotrophomonas maltophilia NOT DETECTED NOT DETECTED Final   Candida albicans DETECTED (A) NOT DETECTED Final    Comment: CRITICAL RESULT CALLED TO, READ BACK BY AND VERIFIED WITH: PHARMD ABBOTT [phone] [id number] FCP    Candida auris NOT DETECTED NOT DETECTED Final   Candida glabrata NOT DETECTED NOT DETECTED Final   Candida krusei NOT DETECTED NOT DETECTED Final   Candida parapsilosis NOT DETECTED NOT DETECTED Final   Candida tropicalis NOT DETECTED NOT DETECTED Final   Cryptococcus neoformans/gattii NOT DETECTED NOT DETECTED Final    Comment: Performed at [facility], [place] [address], [place], [place] [address]  Antifungal AST 9 Drug Panel     Status: None   Collection Time: 08/16/20  3:50 PM  Result Value Ref Range Status   Organism ID, Yeast Candida albicans  Corrected    Comment: (NOTE) Identification performed by account, not confirmed by this laboratory. CORRECTED ON 04/14 AT 1238: PREVIOUSLY REPORTED AS Preliminary report    Amphotericin B  MIC 1.0 ug/mL  Final    Comment: (NOTE) *Breakpoints have been established for only some organism-drug combinations as indicated. Results of this test are labeled for research purposes only by the assay's manufacturer. The performance characteristics of this assay have not been established by the manufacturer. The result should not be used for treatment or for diagnostic purposes without confirmation of the diagnosis by another medically established diagnostic product or procedure. The performance characteristics were determined by [facility].    Please Note: PENDING  Incomplete   Anidulafungin MIC Comment  Final    Comment: (NOTE) 0.03 ug/mL Susceptible *Breakpoints have been established for only some organism-drug combinations as indicated. Results of this test are labeled for research purposes only by the assay's manufacturer. The performance characteristics of this assay have not been established by the manufacturer. The result should not be used for treatment or for diagnostic purposes without confirmation of the diagnosis by another medically established diagnostic product or procedure. The performance characteristics were determined by [facility].    Caspofungin MIC Comment  Final    Comment: 0.06 ug/mL Susceptible   Micafungin MIC Comment  Final    Comment: 0.016 ug/mL Susceptible   Posaconazole MIC 0.03 ug/mL  Final    Comment: (NOTE) *Breakpoints have been established for only some organism-drug combinations as indicated. Results of this test are labeled for research purposes only by the assay's manufacturer. The performance characteristics of this assay have not been established by the manufacturer. The result should not be used for treatment or for diagnostic purposes without confirmation of the diagnosis by another medically established diagnostic product or procedure. The performance characteristics were determined by [facility].    Please Note: PENDING  Incomplete    Fluconazole Islt MIC Comment  Final    Comment: 0.25 ug/mL Susceptible   Flucytosine MIC 1.0 ug/mL  Final   Itraconazole MIC 0.06 ug/mL  Final   Voriconazole MIC Comment  Final    Comment: (NOTE) 0.008 ug/mL or less, Susceptible Performed At: [facility]  [place], [place] [id number] Rush Farmer MD RV:[id number]    Source CANDIDA ALBICANS/ BLOOD  Final    Comment: Performed at [facility], [place] [address], [place], [place] [address]  Culture, blood (routine x 2) Call MD if unable to obtain prior to antibiotics being given     Status: Abnormal   Collection Time: 08/16/20  3:52 PM   Specimen: BLOOD LEFT ARM  Result Value Ref Range Status   Specimen Description BLOOD LEFT ARM  Final   Special Requests   Final    BOTTLES DRAWN AEROBIC AND ANAEROBIC Blood Culture results may not be optimal due to an inadequate volume of blood received in culture bottles   Culture  Setup Time (A)  Final    YEAST AEROBIC BOTTLE ONLY CRITICAL VALUE NOTED.  VALUE IS CONSISTENT WITH PREVIOUSLY REPORTED AND CALLED VALUE. Performed at [facility], [place] [address], [place], [place] [address]    Culture CANDIDA ALBICANS (A)  Final   Report Status 08/21/2020 FINAL  Final  Culture, blood (routine x 2)     Status: None   Collection Time: 08/18/20  7:51 AM   Specimen: BLOOD  Result Value Ref Range Status   Specimen Description BLOOD RIGHT ANTECUBITAL  Final   Special Requests   Final    BOTTLES DRAWN AEROBIC AND ANAEROBIC Blood Culture adequate volume   Culture   Final    NO GROWTH 5 DAYS Performed at [facility], [place] [address] [place], [place] [address]    Report Status 08/23/2020 FINAL  Final  Culture, blood (routine x 2)     Status: None   Collection Time: 08/18/20  8:03 AM   Specimen: BLOOD LEFT WRIST  Result Value Ref Range Status   Specimen Description BLOOD LEFT WRIST  Final   Special Requests   Final    BOTTLES DRAWN AEROBIC AND ANAEROBIC Blood Culture  adequate volume   Culture   Final    NO GROWTH 5 DAYS Performed at [facility], [place] [address] [place], [place] [address]    Report Status 08/23/2020 FINAL  Final     Terri Piedra, NP [facility]  08/26/2020  2:18 PM

## 2020-08-26 NOTE — Progress Notes (Signed)
ANTICOAGULATION CONSULT NOTE - Follow Up Consult  Pharmacy Consult for Heparin Indication: Intracardiac clot  Allergies  Allergen Reactions  . Penicillins Hives and Other (See Comments)  . Tramadol Other (See Comments) and Palpitations    Patient reports it makes her feel spaced out Patient reports it makes her feel spaced out Patient reports it makes her feel spaced out Patient reports it makes her feel spaced out   . Donepezil Diarrhea and Other (See Comments)  . Amlodipine Swelling    dizziness    Patient Measurements: Height: '5\' 1"'$  (154.9 cm) Weight: 58 kg (127 lb 13.9 oz) IBW/kg (Calculated) : 47.8 Heparin Dosing Weight:    Vital Signs: Temp: 97.3 F (36.3 C) (04/15 0322) Temp Source: Oral (04/15 0322) BP: 171/116 (04/15 0609) Pulse Rate: 71 (04/15 0609)  Labs: Recent Labs    08/25/20 0452 08/25/20 0919 08/25/20 1426 08/26/20 0047 08/26/20 1035  HGB  --  9.1*  --  9.0*  --   HCT  --  28.8*  --  28.0*  --   PLT  --  184  --  146*  --   HEPARINUNFRC 0.16*  --  0.27* 0.23* 0.38  CREATININE 2.53*  --   --  2.57*  --     Estimated Creatinine Clearance: 16 mL/min (A) (by C-G formula based on SCr of 2.57 mg/dL (H)).   Assessment: Anticoag: 4/13: Mobile clot in RA cavity on TEE. Recent GI bleeding. IV heparin. Hgb 9 stable. Plts 146 stable. F/u plans for discharge anticoagulation.  Goal of Therapy:  Heparin level 0.3-0.5 units/ml Monitor platelets by anticoagulation protocol: Yes   Plan:  IV heparin (no boluses with GIB) 1000 units/hr Dr. Sloan Leiter really wants heparin minimized Daily HL and CBC   Uva Runkel S. Alford Highland, PharmD, BCPS Clinical Staff Pharmacist [email or web address] [place] 08/26/2020,12:06 PM

## 2020-08-26 NOTE — Progress Notes (Signed)
ANTICOAGULATION CONSULT NOTE - Follow Up Consult  Pharmacy Consult for Heparin Indication: Intracardiac clot  Allergies  Allergen Reactions  . Penicillins Hives and Other (See Comments)  . Tramadol Other (See Comments) and Palpitations    Patient reports it makes her feel spaced out Patient reports it makes her feel spaced out Patient reports it makes her feel spaced out Patient reports it makes her feel spaced out   . Donepezil Diarrhea and Other (See Comments)  . Amlodipine Swelling    dizziness    Patient Measurements: Height: '5\' 1"'$  (154.9 cm) Weight: 58 kg (127 lb 13.9 oz) IBW/kg (Calculated) : 47.8 Heparin Dosing Weight:    Vital Signs: Temp: 97.8 F (36.6 C) (04/15 0021) Temp Source: Axillary (04/15 0021) BP: 141/76 (04/15 0021) Pulse Rate: 73 (04/15 0021)  Labs: Recent Labs    08/25/20 0452 08/25/20 0919 08/25/20 1426 08/26/20 0047  HGB  --  9.1*  --  9.0*  HCT  --  28.8*  --  28.0*  PLT  --  184  --  146*  HEPARINUNFRC 0.16*  --  0.27* 0.23*  CREATININE 2.53*  --   --  2.57*    Estimated Creatinine Clearance: 16 mL/min (A) (by C-G formula based on SCr of 2.57 mg/dL (H)).   Assessment: Anticoag: 4/13: Mobile clot in RA cavity on TEE. Recent GI bleeding. IV heparin. Hgb 9.1 stable. Plts up to 184.   Heparin level subtherapeutic (0.23) on gtt at 900 units/hr. No issues with line or bleeding reported per RN.  Goal of Therapy:  Heparin level 0.3-0.5 units/ml Monitor platelets by anticoagulation protocol: Yes   Plan:  Increase IV heparin (no boluses with GIB) to 1000 units/hr Will f/u 8 hr heparin level  Sherlon Handing, PharmD, BCPS Please see [email or web address] for complete clinical pharmacist phone list 08/26/2020,2:55 AM

## 2020-08-26 NOTE — Consult Note (Signed)
Ref: [name], MD   Subjective:  Awake. Afebrile. Sitting up eating lunch.  Objective:  Vital Signs in the last 24 hours: Temp:  [97.3 F (36.3 C)-97.8 F (36.6 C)] 97.3 F (36.3 C) (04/15 0322) Pulse Rate:  [65-80] 71 (04/15 0609) Cardiac Rhythm: Normal sinus rhythm (04/15 0800) Resp:  [13-24] 13 (04/15 0322) BP: (141-180)/(76-116) 171/116 (04/15 0609) SpO2:  [92 %-96 %] 96 % (04/15 0322)  Physical Exam: BP Readings from Last 1 Encounters:  08/26/20 (!) 171/116     Wt Readings from Last 1 Encounters:  08/24/20 58 kg    Weight change:  Body mass index is 24.16 kg/m. HEENT: [place]/AT, Eyes-Hazel, Conjunctiva-Pale pink, Sclera-Non-icteric Neck: No JVD, No bruit, Trachea midline. Lungs:  Clear, Bilateral. Cardiac:  Regular rhythm, normal S1 and S2, no S3. III/VI systolic and II/VI diastolic murmur. Abdomen:  Soft, non-tender. BS present. Extremities:  No edema present. No cyanosis. No clubbing. CNS: AxOx2, Cranial nerves grossly intact, moves all 4 extremities.  Skin: Warm and dry.   Intake/Output from previous day: 04/14 0701 - 04/15 0700 In: 551.7 [P.O.:220; I.V.:131.7; IV Piggyback:200] Out: 300 [Urine:300]    Lab Results: BMET    Component Value Date/Time   NA 133 (L) 08/26/2020 0047   NA 138 08/25/2020 0452   NA 132 (L) 08/22/2020 0045   NA 138 07/20/2020 0000   NA 133 (A) 06/22/2020 0000   NA 141 05/25/2020 0000   K 4.2 08/26/2020 0047   K 3.6 08/25/2020 0452   K 4.3 08/22/2020 0045   CL 108 08/26/2020 0047   CL 107 08/25/2020 0452   CL 104 08/22/2020 0045   CO2 18 (L) 08/26/2020 0047   CO2 23 08/25/2020 0452   CO2 20 (L) 08/22/2020 0045   GLUCOSE 119 (H) 08/26/2020 0047   GLUCOSE 79 08/25/2020 0452   GLUCOSE 261 (H) 08/22/2020 0045   BUN 36 (H) 08/26/2020 0047   BUN 34 (H) 08/25/2020 0452   BUN 52 (H) 08/22/2020 0045   BUN 41 (A) 07/20/2020 0000   BUN 57 (A) 06/22/2020 0000   BUN 54 (A) 05/25/2020 0000   CREATININE 2.57 (H) 08/26/2020 0047    CREATININE 2.53 (H) 08/25/2020 0452   CREATININE 3.01 (H) 08/22/2020 0045   CALCIUM 7.8 (L) 08/26/2020 0047   CALCIUM 8.1 (L) 08/25/2020 0452   CALCIUM 7.6 (L) 08/22/2020 0045   GFRNONAA 19 (L) 08/26/2020 0047   GFRNONAA 20 (L) 08/25/2020 0452   GFRNONAA 16 (L) 08/22/2020 0045   CBC    Component Value Date/Time   WBC 5.5 08/26/2020 0047   RBC 3.23 (L) 08/26/2020 0047   HGB 9.0 (L) 08/26/2020 0047   HCT 28.0 (L) 08/26/2020 0047   PLT 146 (L) 08/26/2020 0047   MCV 86.7 08/26/2020 0047   MCV 90 06/22/2020 0000   MCH 27.9 08/26/2020 0047   MCHC 32.1 08/26/2020 0047   RDW 17.1 (H) 08/26/2020 0047   LYMPHSABS 0.5 (L) 08/16/2020 1556   MONOABS 0.2 08/16/2020 1556   EOSABS 0.1 08/16/2020 1556   BASOSABS 0.0 08/16/2020 1556   HEPATIC Function Panel Recent Labs    08/16/20 1556 08/18/20 0232 08/19/20 0158  PROT 6.0* 5.0* 5.0*   HEMOGLOBIN A1C No components found for: HGA1C,  MPG CARDIAC ENZYMES No results found for: CKTOTAL, CKMB, CKMBINDEX, TROPONINI BNP No results for input(s): PROBNP in the last 8760 hours. TSH Recent Labs    08/16/20 1556  TSH 2.903   CHOLESTEROL No results for input(s): CHOL  in the last 8760 hours.  Scheduled Meds: . carvedilol  25 mg Oral BID  . Chlorhexidine Gluconate Cloth  6 each Topical Daily  . hydrALAZINE  25 mg Oral Q8H  . insulin aspart  0-9 Units Subcutaneous TID WC  . insulin glargine  10 Units Subcutaneous QHS  . pantoprazole  40 mg Oral BID  . peg 3350 powder  0.5 kit Oral Once  . predniSONE  10 mg Oral Q breakfast  . rOPINIRole  3 mg Oral QHS  . rosuvastatin  20 mg Oral Daily  . sertraline  100 mg Oral QHS  . sodium bicarbonate  1,300 mg Oral BID  . sulfamethoxazole-trimethoprim  1 tablet Oral Once per day on Mon Wed Fri  . tacrolimus  1 mg Oral TID   Continuous Infusions: . sodium chloride 10 mL/hr at 08/26/20 1150  . anidulafungin 78 mL/hr at 08/26/20 1150  . 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 Continue medical treatment. SNF/Rehab as arranged.   LOS: 9 days   Time spent including chart review, lab review, examination, discussion with patient/Husband : 30 min   Dixie Dials  MD  08/26/2020, 1:33 PM

## 2020-08-27 DIAGNOSIS — D649 Anemia, unspecified: Secondary | ICD-10-CM | POA: Diagnosis not present

## 2020-08-27 DIAGNOSIS — K31811 Angiodysplasia of stomach and duodenum with bleeding: Secondary | ICD-10-CM | POA: Diagnosis not present

## 2020-08-27 DIAGNOSIS — K921 Melena: Secondary | ICD-10-CM | POA: Diagnosis not present

## 2020-08-27 LAB — GLUCOSE, CAPILLARY
Glucose-Capillary: 61 mg/dL — ABNORMAL LOW (ref 70–99)
Glucose-Capillary: 87 mg/dL (ref 70–99)
Glucose-Capillary: 167 mg/dL — ABNORMAL HIGH (ref 70–99)
Glucose-Capillary: 173 mg/dL — ABNORMAL HIGH (ref 70–99)
Glucose-Capillary: 176 mg/dL — ABNORMAL HIGH (ref 70–99)

## 2020-08-27 LAB — CBC
HCT: 26.3 % — ABNORMAL LOW (ref 36.0–46.0)
Hemoglobin: 8.1 g/dL — ABNORMAL LOW (ref 12.0–15.0)
MCH: 27.6 pg (ref 26.0–34.0)
MCHC: 30.8 g/dL (ref 30.0–36.0)
MCV: 89.5 fL (ref 80.0–100.0)
Platelets: 211 10*3/uL (ref 150–400)
RBC: 2.94 MIL/uL — ABNORMAL LOW (ref 3.87–5.11)
RDW: 17.2 % — ABNORMAL HIGH (ref 11.5–15.5)
WBC: 6 10*3/uL (ref 4.0–10.5)
nRBC: 0 % (ref 0.0–0.2)

## 2020-08-27 LAB — HEPARIN LEVEL (UNFRACTIONATED): Heparin Unfractionated: 0.43 IU/mL (ref 0.30–0.70)

## 2020-08-27 MED ORDER — POLYETHYLENE GLYCOL 3350 17 G PO PACK
17.0000 g | PACK | Freq: Two times a day (BID) | ORAL | Status: DC
  Administered 2020-08-27 – 2020-09-01 (×6): 17 g via ORAL
  Filled 2020-08-27 (×10): qty 1

## 2020-08-27 NOTE — Consult Note (Signed)
Ref: [name], MD   Subjective:  Awake. Afebrile. Patient aware of long term antifungal treatment.  Objective:  Vital Signs in the last 24 hours: Temp:  [97.2 F (36.2 C)-98.4 F (36.9 C)] 98.4 F (36.9 C) (04/16 1636) Pulse Rate:  [70-81] 76 (04/16 1636) Cardiac Rhythm: Normal sinus rhythm (04/16 0705) Resp:  [15-21] 19 (04/16 1636) BP: (132-190)/(81-107) 151/91 (04/16 1636) SpO2:  [90 %-98 %] 94 % (04/16 1636)  Physical Exam: BP Readings from Last 1 Encounters:  08/27/20 (!) 151/91     Wt Readings from Last 1 Encounters:  08/24/20 58 kg    Weight change:  Body mass index is 24.16 kg/m. HEENT: /AT, Eyes-Hazel, Conjunctiva-Pale pink, Sclera-Non-icteric Neck: No JVD, No bruit, Trachea midline. Lungs:  Clear, Bilateral. Cardiac:  Regular rhythm, normal S1 and S2, no S3. III/VI systolic and II/VI diastolic murmur. Abdomen:  Soft, non-tender. BS present. Extremities:  No edema present. No cyanosis. No clubbing. CNS: AxOx2, Cranial nerves grossly intact, moves all 4 extremities.  Skin: Warm and dry.   Intake/Output from previous day: 04/15 0701 - 04/16 0700 In: 1543.5 [P.O.:240; I.V.:1018.3; IV Piggyback:285.1] Out: 200 [Urine:200]    Lab Results: BMET    Component Value Date/Time   NA 133 (L) 08/26/2020 0047   NA 138 08/25/2020 0452   NA 132 (L) 08/22/2020 0045   NA 138 07/20/2020 0000   NA 133 (A) 06/22/2020 0000   NA 141 05/25/2020 0000   K 4.2 08/26/2020 0047   K 3.6 08/25/2020 0452   K 4.3 08/22/2020 0045   CL 108 08/26/2020 0047   CL 107 08/25/2020 0452   CL 104 08/22/2020 0045   CO2 18 (L) 08/26/2020 0047   CO2 23 08/25/2020 0452   CO2 20 (L) 08/22/2020 0045   GLUCOSE 119 (H) 08/26/2020 0047   GLUCOSE 79 08/25/2020 0452   GLUCOSE 261 (H) 08/22/2020 0045   BUN 36 (H) 08/26/2020 0047   BUN 34 (H) 08/25/2020 0452   BUN 52 (H) 08/22/2020 0045   BUN 41 (A) 07/20/2020 0000   BUN 57 (A) 06/22/2020 0000   BUN 54 (A) 05/25/2020 0000    CREATININE 2.57 (H) 08/26/2020 0047   CREATININE 2.53 (H) 08/25/2020 0452   CREATININE 3.01 (H) 08/22/2020 0045   CALCIUM 7.8 (L) 08/26/2020 0047   CALCIUM 8.1 (L) 08/25/2020 0452   CALCIUM 7.6 (L) 08/22/2020 0045   GFRNONAA 19 (L) 08/26/2020 0047   GFRNONAA 20 (L) 08/25/2020 0452   GFRNONAA 16 (L) 08/22/2020 0045   CBC    Component Value Date/Time   WBC 6.0 08/27/2020 0021   RBC 2.94 (L) 08/27/2020 0021   HGB 8.1 (L) 08/27/2020 0021   HCT 26.3 (L) 08/27/2020 0021   PLT 211 08/27/2020 0021   MCV 89.5 08/27/2020 0021   MCV 90 06/22/2020 0000   MCH 27.6 08/27/2020 0021   MCHC 30.8 08/27/2020 0021   RDW 17.2 (H) 08/27/2020 0021   LYMPHSABS 0.5 (L) 08/16/2020 1556   MONOABS 0.2 08/16/2020 1556   EOSABS 0.1 08/16/2020 1556   BASOSABS 0.0 08/16/2020 1556   HEPATIC Function Panel Recent Labs    08/16/20 1556 08/18/20 0232 08/19/20 0158  PROT 6.0* 5.0* 5.0*   HEMOGLOBIN A1C No components found for: HGA1C,  MPG CARDIAC ENZYMES No results found for: CKTOTAL, CKMB, CKMBINDEX, TROPONINI BNP No results for input(s): PROBNP in the last 8760 hours. TSH Recent Labs    08/16/20 1556  TSH 2.903   CHOLESTEROL No results for  input(s): CHOL in the last 8760 hours.  Scheduled Meds: . carvedilol  25 mg Oral BID  . Chlorhexidine Gluconate Cloth  6 each Topical Daily  . hydrALAZINE  25 mg Oral Q8H  . insulin aspart  0-9 Units Subcutaneous TID WC  . insulin glargine  10 Units Subcutaneous QHS  . pantoprazole  40 mg Oral BID  . polyethylene glycol  17 g Oral BID  . predniSONE  10 mg Oral Q breakfast  . rOPINIRole  3 mg Oral QHS  . rosuvastatin  20 mg Oral Daily  . sertraline  100 mg Oral QHS  . sodium bicarbonate  1,300 mg Oral BID  . sulfamethoxazole-trimethoprim  1 tablet Oral Once per day on Mon Wed Fri  . tacrolimus  1 mg Oral TID   Continuous Infusions: . sodium chloride 10 mL/hr at 08/26/20 1842  . anidulafungin 200 mg (08/27/20 [id number])  . 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   LOS: 10 days   Time spent including chart review, lab review, examination, discussion with patient/Nurse/Doctor : 30 min   Dixie Dials  MD  08/27/2020, 5:03 PM

## 2020-08-27 NOTE — Progress Notes (Signed)
ANTICOAGULATION CONSULT NOTE - Follow Up Consult  Pharmacy Consult for Heparin Indication: Intracardiac clot  Patient Measurements: Height: '5\' 1"'$  (154.9 cm) Weight: 58 kg (127 lb 13.9 oz) IBW/kg (Calculated) : 47.8 Heparin Dosing Weight:    Vital Signs: Temp: 97.9 F (36.6 C) (04/16 0442) Temp Source: Axillary (04/16 0442) BP: 149/84 (04/16 0503) Pulse Rate: 79 (04/16 0442)  Labs: Recent Labs     0000 08/25/20 0452 08/25/20 0919 08/25/20 1426 08/26/20 0047 08/26/20 1035 08/27/20 0021  HGB   < >  --  9.1*  --  9.0*  --  8.1*  HCT  --   --  28.8*  --  28.0*  --  26.3*  PLT  --   --  184  --  146*  --  211  HEPARINUNFRC  --  0.16*  --    < > 0.23* 0.38 0.43  CREATININE  --  2.53*  --   --  2.57*  --   --    < > = values in this interval not displayed.    Estimated Creatinine Clearance: 16 mL/min (A) (by C-G formula based on SCr of 2.57 mg/dL (H)).   Assessment: On 4/13, mobile clot was visualized in RA cavity on TEE. Recent GI bleeding. Pharmacy consulted to dose IV heparin for anticoagulation.  Heparin level therapeutic 0.43 CBC Hgb: 8.1, Plt 211  Spoke with RN today who reported no issues with line and no signs of bleeding and no bloody stools.   Goal of Therapy:  Heparin level 0.3-0.5 units/ml Monitor platelets by anticoagulation protocol: Yes   Plan:  Continue heparin at current rate of 1000 units/hr Monitor s/sx bleeding, CBC, and daily HL  F/u longterm anticoagulation plans    Wilson Singer, PharmD PGY1 Pharmacy Resident 08/27/2020 7:26 AM

## 2020-08-27 NOTE — Progress Notes (Signed)
PROGRESS NOTE        PATIENT DETAILS Name: Ruth Riley Age: 75 y.o. Sex: female Date of Birth: 1945/12/11 Admit Date: 08/16/2020 Admitting Physician Karmen Bongo, MD SFS:ELTRVU, [name], MD  Brief Narrative: Patient is a 75 y.o. female with history of renal transplant, CKD stage IV, DM-2, HTN, HLD-presented to the hospital with multiple orthopedic complaints-s/p multiple falls with both right/left hand injury, confusion, worsening anemia-she was subsequently transferred from [facility] to [facility] for GI evaluation.  See below for further details.   Significant events: 4/5>> transferred from [facility] to [facility]  Significant studies: 4/6>> chest x-ray: No active disease. 4/8>> x-ray left hand: No acute fracture or dislocation. 4/8>> MRI right hand/wrist: No fracture/osteomyelitis-severe flexor Tina synovitis, second/third MCP joint erosions, enlarged radiocarpal midcarpal joint infusion-suggestive of inflammatory arthropathy. 4/8>> Echo: EF 02-33%, grade 2 diastolic dysfunction 4/35>> CT abdomen/pelvis: No perforation/focal inflammation in the bowel-autosomal dominant polycystic kidney disease.  Mild anasarca. 4/13>> TEE: Vegetation on MV, TV, AV, linear mobile clot in RA cavity  Antimicrobial therapy: Rocephin: 4/6>> 4/8 Zithromax: 4/6>> 4/10 Cefdinir: 4/9>> 4/10 Eraxis: 04/7>>  Microbiology data: 4/5>> blood culture: Candida albicans 4/7>> blood culture: No growth  Pathology: 4/12>> ascending colon biopsy: No inflammation/dysplasia/carcinoma  Procedures : 4/11>> right IJ tunneled CVC 4/12>> EGD: 2 angiectasia is in the stomach-1 with active oozing-s/p APC. 4/12>> colonoscopy: Edema/adherent blood in the proximal ascending colon/cecum-?  Ischemic colitis.  Diverticulosis in the sigmoid/descending colon. 4/13>> TEE: Vegetation on MV, TV, AV, linear mobile clot in RA cavity  Consults: GI, ID, IR, cards,CTVS  DVT Prophylaxis  : SCDs Start: 08/16/20 1429   Subjective: Lying comfortably in bed-no major issues overnight-has not had a bowel movement past 2-3 days.   Assessment/Plan: Acute metabolic encephalopathy: Probably related to fungemia/pneumonia-resolved-suspect her mentation is back to baseline.  Multifocal pneumonia-recent history of COVID-19 infection: Overall improved-completed course of antibiotics.  Cultures as above.  Fungemia with endocarditis involving the mitral, tricuspid and aortic valve On Eraxis-repeat blood cultures negative-ID following.  Not a candidate for surgery-per CT views-patient does not want surgery as well.  Right atrial thrombus: Discussed with cardiologist-Dr. Doylene Canard on 4/14-unlikely that this is related to central line-as the central line was just placed a day or so before the TEE-after extensive discussion with patient/spouse-started on IV heparin on 4/13-seems to be tolerating it pretty well without any obvious evidence of bleeding. Dr. Doylene Canard recommends at least 1 month of anticoagulation-he suggests that if renal function tolerates-switching to SQ Lovenox when closer to discharge.  Unable to do Coumadin as patient is on Bactrim chronically.  Upper GI bleeding with acute blood loss anemia: EGD/colonoscopy as above-no further obvious GI loss apparent-has not had a bowel movement for the past 2-3 days.  Slight drop in hemoglobin but suspect that this is equilibration.  On IV heparin given right atrial thrombus.  Continue PPI.  Biopsy from colonoscopy was negative for malignancy or inflammation.  Continue to watch closely-repeat CBC in a.m.  AKI on CKD stage IV-renal transplantation: AKI hemodynamically mediated-has trended down-and now close to baseline.   History of renal transplantation: Continue steroids/tacrolimus-on prophylactic Bactrim.  HTN: BP controlled-continue Coreg, hydralazine  HLD: Continue statin  DM-2: CBGs stable-watch closely for hypoglycemia-continue Lantus  10 units daily and SSI.   Recent Labs    08/27/20 0816 08/27/20 0838 08/27/20 1207  GLUCAP 61* 87  167*   Right wrist pain/swelling: See above regarding MRI imaging-prior MD discussed with hand surgery-Dr. Hilbert Corrigan thought that this was related to inflammatory changes-recommendations were to keep splint for extended periods and steroids.  Right wrist swelling seems to have improved compared to the past few days.  Chronic back pain: Continue supportive care.  Deconditioning/debility: Due to acute illness-SNF on discharge.   Diet: Diet Order            Diet Carb Modified Fluid consistency: Thin; Room service appropriate? Yes  Diet effective now                  Code Status: DNR  Family Communication: Spouse-David Bankert-[phone]-updated over the phone on 4/16   Disposition Plan:  Status is: Inpatient  Remains inpatient appropriate because:Inpatient level of care appropriate due to severity of illness   Dispo: The patient is from: Home              Anticipated d/c is to: Home              Patient currently is not medically stable to d/c.   Difficult to place patient No   Barriers to Discharge: Fungemia-on Eraxis-on IV heparin to ensure no more GI bleeding before transitioning to SQ Lovenox.  Also awaiting SNF bed.  Antimicrobial agents: Anti-infectives (From admission, onward)   Start     Dose/Rate Route Frequency Ordered Stop   08/25/20 1000  anidulafungin (ERAXIS) 200 mg in sodium chloride 0.9 % 200 mL IVPB        200 mg 78 mL/hr over 200 Minutes Intravenous Daily 08/24/20 1445 09/01/20 0959   08/20/20 1000  cefdinir (OMNICEF) capsule 300 mg  Status:  Discontinued        300 mg Oral Daily 08/19/20 0918 08/19/20 0936   08/20/20 1000  cefdinir (OMNICEF) capsule 300 mg        300 mg Oral Daily 08/19/20 0936 08/21/20 0922   08/19/20 1015  azithromycin (ZITHROMAX) tablet 500 mg  Status:  Discontinued        500 mg Oral Daily 08/19/20 0916 08/19/20 0936    08/19/20 1015  azithromycin (ZITHROMAX) tablet 500 mg        500 mg Oral Daily 08/19/20 0936 08/21/20 0921   08/19/20 1000  anidulafungin (ERAXIS) 100 mg in sodium chloride 0.9 % 100 mL IVPB  Status:  Discontinued        100 mg 78 mL/hr over 100 Minutes Intravenous Daily 08/18/20 0247 08/24/20 1445   08/18/20 0400  anidulafungin (ERAXIS) 200 mg in sodium chloride 0.9 % 200 mL IVPB        200 mg 78 mL/hr over 200 Minutes Intravenous  Once 08/18/20 0247 08/18/20 0704   08/17/20 0900  sulfamethoxazole-trimethoprim (BACTRIM) 400-80 MG per tablet 1 tablet        1 tablet Oral Once per day on Mon Wed Fri 08/16/20 1430     08/17/20 0800  cefTRIAXone (ROCEPHIN) 2 g in sodium chloride 0.9 % 100 mL IVPB  Status:  Discontinued        2 g 200 mL/hr over 30 Minutes Intravenous Every 24 hours 08/16/20 1430 08/19/20 0918   08/17/20 0800  azithromycin (ZITHROMAX) 500 mg in sodium chloride 0.9 % 250 mL IVPB  Status:  Discontinued        500 mg 250 mL/hr over 60 Minutes Intravenous Every 24 hours 08/16/20 1430 08/19/20 0916       Time spent: 25 minutes-Greater than 50%  of this time was spent in counseling, explanation of diagnosis, planning of further management, and coordination of care.  MEDICATIONS: Scheduled Meds: . carvedilol  25 mg Oral BID  . Chlorhexidine Gluconate Cloth  6 each Topical Daily  . hydrALAZINE  25 mg Oral Q8H  . insulin aspart  0-9 Units Subcutaneous TID WC  . insulin glargine  10 Units Subcutaneous QHS  . pantoprazole  40 mg Oral BID  . peg 3350 powder  0.5 kit Oral Once  . predniSONE  10 mg Oral Q breakfast  . rOPINIRole  3 mg Oral QHS  . rosuvastatin  20 mg Oral Daily  . sertraline  100 mg Oral QHS  . sodium bicarbonate  1,300 mg Oral BID  . sulfamethoxazole-trimethoprim  1 tablet Oral Once per day on Mon Wed Fri  . tacrolimus  1 mg Oral TID   Continuous Infusions: . sodium chloride 10 mL/hr at 08/26/20 1842  . anidulafungin 200 mg (08/27/20 9892)  . heparin 1,000  Units/hr (08/27/20 0820)   PRN Meds:.acetaminophen **OR** acetaminophen, albuterol, calcium carbonate (dosed in mg elemental calcium), camphor-menthol **AND** hydrOXYzine, docusate sodium, feeding supplement (NEPRO CARB STEADY), guaiFENesin, hydrALAZINE, HYDROcodone-acetaminophen, lidocaine, lip balm, ondansetron **OR** ondansetron (ZOFRAN) IV, oxyCODONE, polyethylene glycol, sorbitol, zolpidem   PHYSICAL EXAM: Vital signs: Vitals:   08/27/20 0300 08/27/20 0442 08/27/20 0503 08/27/20 0725  BP: (!) 142/87 (!) 166/81 (!) 149/84 (!) 153/85  Pulse: 76 79  75  Resp:  19  20  Temp:  97.9 F (36.6 C)  (!) 97.2 F (36.2 C)  TempSrc:  Axillary  Oral  SpO2: 94% 94%  95%  Weight:      Height:       Filed Weights   08/20/20 0339 08/21/20 0347 08/24/20 1205  Weight: 57 kg 58 kg 58 kg   Body mass index is 24.16 kg/m.   Gen Exam:Alert awake-not in any distress HEENT:atraumatic, normocephalic Chest: B/L clear to auscultation anteriorly CVS:S1S2 regular Abdomen:soft non tender, non distended Extremities:no edema Neurology: Non focal Skin: no rash  I have personally reviewed following labs and imaging studies  LABORATORY DATA: CBC: Recent Labs  Lab 08/21/20 0018 08/22/20 0045 08/25/20 0919 08/26/20 0047 08/27/20 0021  WBC 4.1 4.7 5.4 5.5 6.0  HGB 9.0* 9.3* 9.1* 9.0* 8.1*  HCT 28.1* 29.9* 28.8* 28.0* 26.3*  MCV 88.4 89.3 88.6 86.7 89.5  PLT 140* 148* 184 146* 119    Basic Metabolic Panel: Recent Labs  Lab 08/21/20 0018 08/22/20 0045 08/25/20 0452 08/26/20 0047  NA 134* 132* 138 133*  K 3.7 4.3 3.6 4.2  CL 104 104 107 108  CO2 24 20* 23 18*  GLUCOSE 155* 261* 79 119*  BUN 50* 52* 34* 36*  CREATININE 3.08* 3.01* 2.53* 2.57*  CALCIUM 7.9* 7.6* 8.1* 7.8*    GFR: Estimated Creatinine Clearance: 16 mL/min (A) (by C-G formula based on SCr of 2.57 mg/dL (H)).  Liver Function Tests: No results for input(s): AST, ALT, ALKPHOS, BILITOT, PROT, ALBUMIN in the last 168  hours. No results for input(s): LIPASE, AMYLASE in the last 168 hours. No results for input(s): AMMONIA in the last 168 hours.  Coagulation Profile: No results for input(s): INR, PROTIME in the last 168 hours.  Cardiac Enzymes: No results for input(s): CKTOTAL, CKMB, CKMBINDEX, TROPONINI in the last 168 hours.  BNP (last 3 results) No results for input(s): PROBNP in the last 8760 hours.  Lipid Profile: No results for input(s): CHOL, HDL, LDLCALC, TRIG, CHOLHDL, LDLDIRECT in the  last 72 hours.  Thyroid Function Tests: No results for input(s): TSH, T4TOTAL, FREET4, T3FREE, THYROIDAB in the last 72 hours.  Anemia Panel: No results for input(s): VITAMINB12, FOLATE, FERRITIN, TIBC, IRON, RETICCTPCT in the last 72 hours.  Urine analysis: No results found for: COLORURINE, APPEARANCEUR, LABSPEC, PHURINE, GLUCOSEU, HGBUR, BILIRUBINUR, KETONESUR, PROTEINUR, UROBILINOGEN, NITRITE, LEUKOCYTESUR  Sepsis Labs: Lactic Acid, Venous No results found for: LATICACIDVEN  MICROBIOLOGY: Recent Results (from the past 240 hour(s))  Culture, blood (routine x 2)     Status: None   Collection Time: 08/18/20  7:51 AM   Specimen: BLOOD  Result Value Ref Range Status   Specimen Description BLOOD RIGHT ANTECUBITAL  Final   Special Requests   Final    BOTTLES DRAWN AEROBIC AND ANAEROBIC Blood Culture adequate volume   Culture   Final    NO GROWTH 5 DAYS Performed at [facility], [address], [place], [place] [address]    Report Status 08/23/2020 FINAL  Final  Culture, blood (routine x 2)     Status: None   Collection Time: 08/18/20  8:03 AM   Specimen: BLOOD LEFT WRIST  Result Value Ref Range Status   Specimen Description BLOOD LEFT WRIST  Final   Special Requests   Final    BOTTLES DRAWN AEROBIC AND ANAEROBIC Blood Culture adequate volume   Culture   Final    NO GROWTH 5 DAYS Performed at [facility], [place] [address], [place], [place] [address]    Report Status 08/23/2020 FINAL   Final    RADIOLOGY STUDIES/RESULTS: No results found.   LOS: 10 days   Oren Binet, MD  [facility]    To contact the attending provider between 7A-7P or the covering provider during after hours 7P-7A, please log into the web site [email or web address] and access using universal [place] password for that web site. If you do not have the password, please call the hospital operator.  08/27/2020, 1:07 PM

## 2020-08-28 DIAGNOSIS — D649 Anemia, unspecified: Secondary | ICD-10-CM | POA: Diagnosis not present

## 2020-08-28 DIAGNOSIS — K31811 Angiodysplasia of stomach and duodenum with bleeding: Secondary | ICD-10-CM | POA: Diagnosis not present

## 2020-08-28 DIAGNOSIS — K921 Melena: Secondary | ICD-10-CM | POA: Diagnosis not present

## 2020-08-28 LAB — BASIC METABOLIC PANEL
Anion gap: 5 (ref 5–15)
BUN: 33 mg/dL — ABNORMAL HIGH (ref 8–23)
CO2: 23 mmol/L (ref 22–32)
Calcium: 8 mg/dL — ABNORMAL LOW (ref 8.9–10.3)
Chloride: 106 mmol/L (ref 98–111)
Creatinine, Ser: 2.78 mg/dL — ABNORMAL HIGH (ref 0.44–1.00)
GFR, Estimated: 17 mL/min — ABNORMAL LOW (ref >60)
Glucose, Bld: 70 mg/dL (ref 70–99)
Potassium: 4.5 mmol/L (ref 3.5–5.1)
Sodium: 134 mmol/L — ABNORMAL LOW (ref 135–145)

## 2020-08-28 LAB — CBC
HCT: 24.3 % — ABNORMAL LOW (ref 36.0–46.0)
HCT: 26.9 % — ABNORMAL LOW (ref 36.0–46.0)
Hemoglobin: 7.6 g/dL — ABNORMAL LOW (ref 12.0–15.0)
Hemoglobin: 8.4 g/dL — ABNORMAL LOW (ref 12.0–15.0)
MCH: 27.8 pg (ref 26.0–34.0)
MCH: 27.8 pg (ref 26.0–34.0)
MCHC: 31.3 g/dL (ref 30.0–36.0)
MCHC: 31.2 g/dL (ref 30.0–36.0)
MCV: 89 fL (ref 80.0–100.0)
MCV: 89.1 fL (ref 80.0–100.0)
Platelets: 199 10*3/uL (ref 150–400)
Platelets: 214 10*3/uL (ref 150–400)
RBC: 2.73 MIL/uL — ABNORMAL LOW (ref 3.87–5.11)
RBC: 3.02 MIL/uL — ABNORMAL LOW (ref 3.87–5.11)
RDW: 17.2 % — ABNORMAL HIGH (ref 11.5–15.5)
RDW: 17.2 % — ABNORMAL HIGH (ref 11.5–15.5)
WBC: 5.5 10*3/uL (ref 4.0–10.5)
WBC: 5.2 10*3/uL (ref 4.0–10.5)
nRBC: 0 % (ref 0.0–0.2)
nRBC: 0 % (ref 0.0–0.2)

## 2020-08-28 LAB — GLUCOSE, CAPILLARY
Glucose-Capillary: 57 mg/dL — ABNORMAL LOW (ref 70–99)
Glucose-Capillary: 83 mg/dL (ref 70–99)
Glucose-Capillary: 145 mg/dL — ABNORMAL HIGH (ref 70–99)
Glucose-Capillary: 119 mg/dL — ABNORMAL HIGH (ref 70–99)
Glucose-Capillary: 154 mg/dL — ABNORMAL HIGH (ref 70–99)

## 2020-08-28 LAB — HEPARIN LEVEL (UNFRACTIONATED): Heparin Unfractionated: 0.39 IU/mL (ref 0.30–0.70)

## 2020-08-28 MED ORDER — SALINE SPRAY 0.65 % NA SOLN
1.0000 | NASAL | Status: DC | PRN
  Filled 2020-08-28: qty 44

## 2020-08-28 MED ORDER — INSULIN GLARGINE 100 UNIT/ML ~~LOC~~ SOLN
5.0000 [IU] | Freq: Every day | SUBCUTANEOUS | Status: DC
  Administered 2020-08-28 – 2020-08-31 (×4): 5 [IU] via SUBCUTANEOUS
  Filled 2020-08-28 (×5): qty 0.05

## 2020-08-28 NOTE — Progress Notes (Signed)
ANTICOAGULATION CONSULT NOTE - Follow Up Consult  Pharmacy Consult for Heparin Indication: Intracardiac clot  Patient Measurements: Height: '5\' 1"'$  (154.9 cm) Weight: 58 kg (127 lb 13.9 oz) IBW/kg (Calculated) : 47.8 Heparin Dosing Weight:    Vital Signs: Temp: 98.6 F (37 C) (04/17 0433) Temp Source: Oral (04/17 0433) BP: 172/88 (04/17 0558) Pulse Rate: 83 (04/17 0433)  Labs: Recent Labs    08/26/20 0047 08/26/20 1035 08/27/20 0021 08/28/20 0254  HGB 9.0*  --  8.1* 7.6*  HCT 28.0*  --  26.3* 24.3*  PLT 146*  --  211 199  HEPARINUNFRC 0.23* 0.38 0.43 0.39  CREATININE 2.57*  --   --  2.78*    Estimated Creatinine Clearance: 14.8 mL/min (A) (by C-G formula based on SCr of 2.78 mg/dL (H)).   Assessment: On 4/13, mobile clot was visualized in RA cavity on TEE. Recent GI bleeding. Pharmacy consulted to dose IV heparin for anticoagulation.  Heparin level therapeutic 0.39 CBC Hgb: 7.6, Plt 199   Spoke with RN who is in conversations with MD regarding patient's stool - will continue heparin drip for now with close monitoring of the patient for s/sx bleeding and tarry stools.    Goal of Therapy:  Heparin level 0.3-0.5 units/ml Monitor platelets by anticoagulation protocol: Yes   Plan:  Continue heparin at current rate of 1000 units/hr Monitor s/sx bleeding, CBC, and daily HL  F/u longterm anticoagulation plans    Wilson Singer, PharmD PGY1 Pharmacy Resident 08/28/2020 9:05 AM

## 2020-08-28 NOTE — Progress Notes (Signed)
PROGRESS NOTE        PATIENT DETAILS Name: Ruth Riley Age: 75 y.o. Sex: female Date of Birth: Jan 20, 1946 Admit Date: 08/16/2020 Admitting Physician Karmen Bongo, MD [id number], [name], MD  Brief Narrative: Patient is a 75 y.o. female with history of renal transplant, CKD stage IV, DM-2, HTN, HLD-presented to the hospital with multiple orthopedic complaints-s/p multiple falls with both right/left hand injury, confusion, worsening anemia-she was subsequently transferred from [facility] to [facility] for GI evaluation.  See below for further details.   Significant events: 4/5>> transferred from [facility] to [facility]  Significant studies: 4/6>> chest x-ray: No active disease. 4/8>> x-ray left hand: No acute fracture or dislocation. 4/8>> MRI right hand/wrist: No fracture/osteomyelitis-severe flexor Tina synovitis, second/third MCP joint erosions, enlarged radiocarpal midcarpal joint infusion-suggestive of inflammatory arthropathy. 4/8>> Echo: EF [id number], grade 2 diastolic dysfunction 0000000 CT abdomen/pelvis: No perforation/focal inflammation in the bowel-autosomal dominant polycystic kidney disease.  Mild anasarca. 4/13>> TEE: Vegetation on MV, TV, AV, linear mobile clot in RA cavity  Antimicrobial therapy: Rocephin: 4/6>> 4/8 Zithromax: 4/6>> 4/10 Cefdinir: 4/9>> 4/10 Eraxis: 04/7>>  Microbiology data: 4/5>> blood culture: Candida albicans 4/7>> blood culture: No growth  Pathology: 4/12>> ascending colon biopsy: No inflammation/dysplasia/carcinoma  Procedures : 4/11>> right IJ tunneled CVC 4/12>> EGD: 2 angiectasia is in the stomach-1 with active oozing-s/p APC. 4/12>> colonoscopy: Edema/adherent blood in the proximal ascending colon/cecum-?  Ischemic colitis.  Diverticulosis in the sigmoid/descending colon. 4/13>> TEE: Vegetation on MV, TV, AV, linear mobile clot in RA cavity  Consults: GI, ID, IR, cards,CTVS  DVT Prophylaxis  : SCDs Start: 08/16/20 1429   Subjective: No BM for the past several days-complains of pain in her back.  Assessment/Plan: Acute metabolic encephalopathy: Probably related to fungemia/pneumonia-resolved- her mentation is back to baseline.  Multifocal pneumonia-recent history of COVID-19 infection: Overall improved-completed course of antibiotics.  Cultures as above.  Fungemia with endocarditis involving the mitral, tricuspid and aortic valve On Eraxis-repeat blood cultures negative-ID following.  Not a candidate for surgery-per CT views-patient does not want surgery as well.  Right atrial thrombus: Discussed with cardiologist-Dr. Doylene Canard on 4/14-unlikely that this is related to central line-as the central line was just placed a day or so before the TEE-after extensive discussion with patient/spouse-started on IV heparin on 4/13-hemoglobin now slowly trending down-but no bloody or black stools-in fact has not had a BM.  Repeat CBC later today.  May need to stop heparin infusion if hemoglobin continues to fall.  Upper GI bleeding with acute blood loss anemia: EGD/colonoscopy as above-no further obvious GI loss apparent-has not had a bowel movement for the past 2-3 days.  Hemoglobin continues to drop-cautiously continue with IV heparin-repeat CBC later today.   Continue PPI.  Biopsy from colonoscopy was negative for malignancy or inflammation.  Continue to watch closely-repeat CBC in a.m.  AKI on CKD stage IV-renal transplantation: AKI hemodynamically mediated-has trended down-and now close to baseline.   History of renal transplantation: Continue steroids/tacrolimus-on prophylactic Bactrim.  HTN: BP controlled-continue Coreg, hydralazine  HLD: Continue statin  DM-2: CBGs stable-but with a.m. hypoglycemia-decrease Lantus to 5 units-continue SSI.  Follow and adjust.   Recent Labs    08/27/20 2058 08/28/20 0753 08/28/20 0912  GLUCAP 176* 57* 83   Right wrist pain/swelling: See above  regarding MRI imaging-prior MD discussed with hand surgery-Dr. Hilbert Corrigan thought that this  was related to inflammatory changes-recommendations were to keep splint for extended periods and steroids.  Right wrist swelling seems to have improved compared to the past few days.  Chronic back pain: Continue supportive care.  Deconditioning/debility: Due to acute illness-SNF on discharge.   Diet: Diet Order            Diet Carb Modified Fluid consistency: Thin; Room service appropriate? Yes  Diet effective now                  Code Status: DNR  Family Communication: Spouse-David Traughber-[phone]-updated over the phone on 4/16   Disposition Plan:  Status is: Inpatient  Remains inpatient appropriate because:Inpatient level of care appropriate due to severity of illness   Dispo: The patient is from: Home              Anticipated d/c is to: Home              Patient currently is not medically stable to d/c.   Difficult to place patient No   Barriers to Discharge: Fungemia-on Eraxis-on IV heparin to ensure no more GI bleeding before transitioning to SQ Lovenox.  Also awaiting SNF bed.  Antimicrobial agents: Anti-infectives (From admission, onward)   Start     Dose/Rate Route Frequency Ordered Stop   08/25/20 1000  anidulafungin (ERAXIS) 200 mg in sodium chloride 0.9 % 200 mL IVPB        200 mg 78 mL/hr over 200 Minutes Intravenous Daily 08/24/20 1445 09/01/20 0959   08/20/20 1000  cefdinir (OMNICEF) capsule 300 mg  Status:  Discontinued        300 mg Oral Daily 08/19/20 0918 08/19/20 0936   08/20/20 1000  cefdinir (OMNICEF) capsule 300 mg        300 mg Oral Daily 08/19/20 0936 08/21/20 0922   08/19/20 1015  azithromycin (ZITHROMAX) tablet 500 mg  Status:  Discontinued        500 mg Oral Daily 08/19/20 0916 08/19/20 0936   08/19/20 1015  azithromycin (ZITHROMAX) tablet 500 mg        500 mg Oral Daily 08/19/20 0936 08/21/20 0921   08/19/20 1000  anidulafungin (ERAXIS) 100  mg in sodium chloride 0.9 % 100 mL IVPB  Status:  Discontinued        100 mg 78 mL/hr over 100 Minutes Intravenous Daily 08/18/20 0247 08/24/20 1445   08/18/20 0400  anidulafungin (ERAXIS) 200 mg in sodium chloride 0.9 % 200 mL IVPB        200 mg 78 mL/hr over 200 Minutes Intravenous  Once 08/18/20 0247 08/18/20 0704   08/17/20 0900  sulfamethoxazole-trimethoprim (BACTRIM) 400-80 MG per tablet 1 tablet        1 tablet Oral Once per day on Mon Wed Fri 08/16/20 1430     08/17/20 0800  cefTRIAXone (ROCEPHIN) 2 g in sodium chloride 0.9 % 100 mL IVPB  Status:  Discontinued        2 g 200 mL/hr over 30 Minutes Intravenous Every 24 hours 08/16/20 1430 08/19/20 0918   08/17/20 0800  azithromycin (ZITHROMAX) 500 mg in sodium chloride 0.9 % 250 mL IVPB  Status:  Discontinued        500 mg 250 mL/hr over 60 Minutes Intravenous Every 24 hours 08/16/20 1430 08/19/20 0916       Time spent: 25 minutes-Greater than 50% of this time was spent in counseling, explanation of diagnosis, planning of further management, and coordination of care.  MEDICATIONS:  Scheduled Meds: . carvedilol  25 mg Oral BID  . Chlorhexidine Gluconate Cloth  6 each Topical Daily  . hydrALAZINE  25 mg Oral Q8H  . insulin aspart  0-9 Units Subcutaneous TID WC  . insulin glargine  10 Units Subcutaneous QHS  . pantoprazole  40 mg Oral BID  . polyethylene glycol  17 g Oral BID  . predniSONE  10 mg Oral Q breakfast  . rOPINIRole  3 mg Oral QHS  . rosuvastatin  20 mg Oral Daily  . sertraline  100 mg Oral QHS  . sodium bicarbonate  1,300 mg Oral BID  . sulfamethoxazole-trimethoprim  1 tablet Oral Once per day on Mon Wed Fri  . tacrolimus  1 mg Oral TID   Continuous Infusions: . sodium chloride 10 mL/hr at 08/26/20 1842  . anidulafungin 200 mg (08/28/20 0839)  . heparin 1,000 Units/hr (08/28/20 0841)   PRN Meds:.acetaminophen **OR** acetaminophen, albuterol, calcium carbonate (dosed in mg elemental calcium), camphor-menthol  **AND** hydrOXYzine, docusate sodium, feeding supplement (NEPRO CARB STEADY), guaiFENesin, hydrALAZINE, HYDROcodone-acetaminophen, lidocaine, lip balm, ondansetron **OR** ondansetron (ZOFRAN) IV, oxyCODONE, sorbitol, zolpidem   PHYSICAL EXAM: Vital signs: Vitals:   08/27/20 2358 08/28/20 0433 08/28/20 0558 08/28/20 0851  BP: (!) 174/95 (!) 165/86 (!) 172/88 (!) 144/83  Pulse:  83  81  Resp:  18  19  Temp:  98.6 F (37 C)  98.6 F (37 C)  TempSrc:  Oral  Oral  SpO2:    90%  Weight:      Height:       Filed Weights   08/20/20 0339 08/21/20 0347 08/24/20 1205  Weight: 57 kg 58 kg 58 kg   Body mass index is 24.16 kg/m.   Gen Exam:Alert awake-not in any distress HEENT:atraumatic, normocephalic Chest: B/L clear to auscultation anteriorly CVS:S1S2 regular Abdomen:soft non tender, non distended Extremities:no edema Neurology: Non focal Skin: no rash  I have personally reviewed following labs and imaging studies  LABORATORY DATA: CBC: Recent Labs  Lab 08/22/20 0045 08/25/20 0919 08/26/20 0047 08/27/20 0021 08/28/20 0254  WBC 4.7 5.4 5.5 6.0 5.5  HGB 9.3* 9.1* 9.0* 8.1* 7.6*  HCT 29.9* 28.8* 28.0* 26.3* 24.3*  MCV 89.3 88.6 86.7 89.5 89.0  PLT 148* 184 146* 211 [id number]    Basic Metabolic Panel: Recent Labs  Lab 08/22/20 0045 08/25/20 0452 08/26/20 0047 08/28/20 0254  NA 132* 138 133* 134*  K 4.3 3.6 4.2 4.5  CL 104 107 108 106  CO2 20* 23 18* 23  GLUCOSE 261* 79 119* 70  BUN 52* 34* 36* 33*  CREATININE 3.01* 2.53* 2.57* 2.78*  CALCIUM 7.6* 8.1* 7.8* 8.0*    GFR: Estimated Creatinine Clearance: 14.8 mL/min (A) (by C-G formula based on SCr of 2.78 mg/dL (H)).  Liver Function Tests: No results for input(s): AST, ALT, ALKPHOS, BILITOT, PROT, ALBUMIN in the last 168 hours. No results for input(s): LIPASE, AMYLASE in the last 168 hours. No results for input(s): AMMONIA in the last 168 hours.  Coagulation Profile: No results for input(s): INR, PROTIME in the  last 168 hours.  Cardiac Enzymes: No results for input(s): CKTOTAL, CKMB, CKMBINDEX, TROPONINI in the last 168 hours.  BNP (last 3 results) No results for input(s): PROBNP in the last 8760 hours.  Lipid Profile: No results for input(s): CHOL, HDL, LDLCALC, TRIG, CHOLHDL, LDLDIRECT in the last 72 hours.  Thyroid Function Tests: No results for input(s): TSH, T4TOTAL, FREET4, T3FREE, THYROIDAB in the last 72 hours.  Anemia Panel:  No results for input(s): VITAMINB12, FOLATE, FERRITIN, TIBC, IRON, RETICCTPCT in the last 72 hours.  Urine analysis: No results found for: COLORURINE, APPEARANCEUR, LABSPEC, PHURINE, GLUCOSEU, HGBUR, BILIRUBINUR, KETONESUR, PROTEINUR, UROBILINOGEN, NITRITE, LEUKOCYTESUR  Sepsis Labs: Lactic Acid, Venous No results found for: LATICACIDVEN  MICROBIOLOGY: No results found for this or any previous visit (from the past 240 hour(s)).  RADIOLOGY STUDIES/RESULTS: No results found.   LOS: 11 days   Oren Binet, MD  [facility]    To contact the attending provider between 7A-7P or the covering provider during after hours 7P-7A, please log into the web site [email or web address] and access using universal [place] password for that web site. If you do not have the password, please call the hospital operator.  08/28/2020, 12:00 PM

## 2020-08-29 DIAGNOSIS — D8481 Immunodeficiency due to conditions classified elsewhere: Secondary | ICD-10-CM | POA: Diagnosis not present

## 2020-08-29 DIAGNOSIS — K529 Noninfective gastroenteritis and colitis, unspecified: Secondary | ICD-10-CM | POA: Diagnosis not present

## 2020-08-29 DIAGNOSIS — B376 Candidal endocarditis: Secondary | ICD-10-CM | POA: Diagnosis not present

## 2020-08-29 DIAGNOSIS — Z940 Kidney transplant status: Secondary | ICD-10-CM | POA: Diagnosis not present

## 2020-08-29 LAB — TYPE AND SCREEN
ABO/RH(D): O NEG
Antibody Screen: NEGATIVE

## 2020-08-29 LAB — GLUCOSE, CAPILLARY
Glucose-Capillary: 98 mg/dL (ref 70–99)
Glucose-Capillary: 155 mg/dL — ABNORMAL HIGH (ref 70–99)
Glucose-Capillary: 287 mg/dL — ABNORMAL HIGH (ref 70–99)
Glucose-Capillary: 187 mg/dL — ABNORMAL HIGH (ref 70–99)

## 2020-08-29 LAB — HEMOGLOBIN AND HEMATOCRIT, BLOOD
HCT: 26.7 % — ABNORMAL LOW (ref 36.0–46.0)
Hemoglobin: 8.4 g/dL — ABNORMAL LOW (ref 12.0–15.0)

## 2020-08-29 LAB — CBC
HCT: 25.2 % — ABNORMAL LOW (ref 36.0–46.0)
Hemoglobin: 7.8 g/dL — ABNORMAL LOW (ref 12.0–15.0)
MCH: 27.6 pg (ref 26.0–34.0)
MCHC: 31 g/dL (ref 30.0–36.0)
MCV: 89 fL (ref 80.0–100.0)
Platelets: 208 10*3/uL (ref 150–400)
RBC: 2.83 MIL/uL — ABNORMAL LOW (ref 3.87–5.11)
RDW: 17.1 % — ABNORMAL HIGH (ref 11.5–15.5)
WBC: 4.2 10*3/uL (ref 4.0–10.5)
nRBC: 0 % (ref 0.0–0.2)

## 2020-08-29 LAB — HEPARIN LEVEL (UNFRACTIONATED): Heparin Unfractionated: 0.41 IU/mL (ref 0.30–0.70)

## 2020-08-29 MED ORDER — TACROLIMUS 1 MG PO CAPS
2.0000 mg | ORAL_CAPSULE | Freq: Every day | ORAL | Status: DC
  Administered 2020-08-30 – 2020-08-31 (×2): 2 mg via ORAL
  Filled 2020-08-29 (×2): qty 2

## 2020-08-29 MED ORDER — TACROLIMUS 1 MG PO CAPS
1.0000 mg | ORAL_CAPSULE | Freq: Once | ORAL | Status: AC
  Administered 2020-08-29: 1 mg via ORAL
  Filled 2020-08-29: qty 1

## 2020-08-29 MED ORDER — OXYMETAZOLINE HCL 0.05 % NA SOLN
3.0000 | Freq: Two times a day (BID) | NASAL | Status: AC | PRN
  Administered 2020-08-29: 3 via NASAL
  Filled 2020-08-29: qty 30

## 2020-08-29 MED ORDER — SALINE SPRAY 0.65 % NA SOLN
1.0000 | NASAL | Status: DC | PRN
  Filled 2020-08-29: qty 44

## 2020-08-29 MED ORDER — HYDRALAZINE HCL 50 MG PO TABS
50.0000 mg | ORAL_TABLET | Freq: Three times a day (TID) | ORAL | Status: DC
  Administered 2020-08-29 – 2020-09-01 (×10): 50 mg via ORAL
  Filled 2020-08-29 (×10): qty 1

## 2020-08-29 MED ORDER — TACROLIMUS 1 MG PO CAPS
1.0000 mg | ORAL_CAPSULE | Freq: Every day | ORAL | Status: DC
  Administered 2020-08-29 – 2020-08-30 (×2): 1 mg via ORAL
  Filled 2020-08-29 (×2): qty 1

## 2020-08-29 NOTE — Progress Notes (Signed)
Physical Therapy Treatment Patient Details Name: Ruth Riley MRN: [id number] DOB: 1946/02/02 Today's Date: 08/29/2020    History of Present Illness Pt is 75 y.o. female who presented to [facility] with confusion, SoB, and cough and was transferred to [facility] and admitted 08/16/20. Pt being treated for anemia, bilateral PNA and possible GI bleed.  On 4/13 TEE found  small vegetations on MV, TV, AV and a mobile clot in the RA cavity. Pt started on heparin. Pt thought to have fx L 4th finger and was splinted - later found to not be broken and splint off.  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  PMH: DM; HTN; and h/o renal transplant with current stage 4 CKD (baseline 3.7) with anemia (baseline 9), COVID infection in early February. hx of multiple falls and orthopedic complaints most recently L ring finger fx.    PT Comments    Continuing work on functional mobility and activity tolerance;  Needed initial encouragement to participate, but seemed pleased with her walking; session focused on trying a R platform RW to see if it would be more useful than a regular RW with ambulation, and it has proven to be useful; Pt states taht she already has a RW and would just want the platform attachment   Follow Up Recommendations  SNF     Equipment Recommendations  Other (comment) (R platform RW)    Recommendations for Other Services       Precautions / Restrictions Precautions Precautions: Fall Precaution Comments: multple falls Required Braces or Orthoses: Other Brace Splint/Cast: R wrist brace at all times; foam arm elevator Restrictions Other Position/Activity Restrictions: No weightbearing  restrictions ordered - pt unaware of any restrictions and reports R wrist injury present prior to admission    Mobility  Bed Mobility                    Transfers Overall transfer level: Needs assistance Equipment used: Right platform walker Transfers: Sit to/from Stand Sit to Stand: Mod assist         General transfer comment: Mod assist to power up; cues for timing and placemetn of RUE on platform  Ambulation/Gait Ambulation/Gait assistance: Min assist;+2 safety/equipment Gait Distance (Feet): 20 Feet Assistive device: Right platform walker Gait Pattern/deviations: Decreased stride length;Step-through pattern;Decreased step length - right;Decreased step length - left;Trunk flexed Gait velocity: decr   General Gait Details: Assist for balance and support. Assist to guide walker and cues to self-monitor for activity tolerance   Stairs             Wheelchair Mobility    Modified Rankin (Stroke Patients Only)       Balance     Sitting balance-Leahy Scale: Fair Sitting balance - Comments: Able to maintain balance sitting at edge of chair; Cues to shift side to side to slide back in chair - mod A to complete slide back     Standing balance-Leahy Scale: Poor                              Cognition Arousal/Alertness: Awake/alert Behavior During Therapy: WFL for tasks assessed/performed Overall Cognitive Status: Within Functional Limits for tasks assessed (for simple  mobility tasks)                                 General Comments: Seems to be improving      Exercises      General Comments General comments (skin integrity, edema, etc.): VSS on Room Air      Pertinent Vitals/Pain Pain Assessment: Faces Faces Pain Scale: Hurts even more Pain Location: low back Pain Descriptors / Indicators: Grimacing;Aching Pain Intervention(s): Monitored during session;Repositioned    Home Living                      Prior  Function            PT Goals (current goals can now be found in the care plan section) Acute Rehab PT Goals Patient Stated Goal: feel better PT Goal Formulation: With patient Time For Goal Achievement: 08/31/20 Potential to Achieve Goals: Good Progress towards PT goals: Progressing toward goals    Frequency    Min 2X/week      PT Plan Current plan remains appropriate    Co-evaluation              AM-PAC PT "6 Clicks" Mobility   Outcome Measure  Help needed turning from your back to your side while in a flat bed without using bedrails?: A Lot Help needed moving from lying on your back to sitting on the side of a flat bed without using bedrails?: A Lot Help needed moving to and from a bed to a chair (including a wheelchair)?: A Lot Help needed standing up from a chair using your arms (e.g., wheelchair or bedside chair)?: A Lot Help needed to walk in hospital room?: A Little Help needed climbing 3-5 steps with a railing? : Total 6 Click Score: 12    End of Session Equipment Utilized During Treatment: Gait belt (wrist brace) Activity Tolerance: Patient tolerated treatment well Patient left: with chair alarm set;in chair;with call bell/phone within reach (R arm elevated) Nurse Communication: Mobility status PT Visit Diagnosis: Unsteadiness on feet (R26.81);Muscle weakness (generalized) (M62.81);History of falling (Z91.81);Difficulty in walking, not elsewhere classified (R26.2);Pain Pain - part of body:  (back)     Time: [id number] PT Time Calculation (min) (ACUTE ONLY): 27 min  Charges:  $Gait Training: 8-22 mins $Therapeutic Activity: 8-22 mins                     Roney Marion, PT  Acute Rehabilitation Services Pager [phone] Office [place] 08/29/2020, 2:06 PM

## 2020-08-29 NOTE — Consult Note (Signed)
Ref: [name], MD  Late entry:  Subjective:  Awake. Afebrile. VS stable. Hgb somewhat lower.  Objective:  Vital Signs in the last 24 hours: Temp:  [97.6 F (36.4 C)-98.9 F (37.2 C)] 97.6 F (36.4 C) (04/18 0736) Pulse Rate:  [69-75] 75 (04/18 0736) Cardiac Rhythm: Normal sinus rhythm (04/18 0738) Resp:  [15-20] 20 (04/18 0736) BP: (149-188)/(81-106) 176/99 (04/18 0736) SpO2:  [90 %-96 %] 90 % (04/18 0736)  Physical Exam: BP Readings from Last 1 Encounters:  08/29/20 (!) 176/99     Wt Readings from Last 1 Encounters:  08/24/20 58 kg    Weight change:  Body mass index is 24.16 kg/m. HEENT: [place]/AT, Eyes-Hazel, Conjunctiva-Pale, Sclera-Non-icteric Neck: No JVD, No bruit, Trachea midline. Lungs:  Clear, Bilateral. Cardiac:  Regular rhythm, normal S1 and S2, no S3. III/VI systolic and II/VI diastolic murmur. Abdomen:  Soft, non-tender. BS present. Extremities:  No edema present. No cyanosis. No clubbing. Chronic right arm swelling and pain. CNS: AxOx2, Cranial nerves grossly intact, moves all 4 extremities.  Skin: Warm and dry.   Intake/Output from previous day: 04/17 0701 - 04/18 0700 In: 744.4 [P.O.:240; I.V.:244.4; IV Piggyback:260] Out: 650 [Urine:650]    Lab Results: BMET    Component Value Date/Time   NA 134 (L) 08/28/2020 0254   NA 133 (L) 08/26/2020 0047   NA 138 08/25/2020 0452   NA 138 07/20/2020 0000   NA 133 (A) 06/22/2020 0000   NA 141 05/25/2020 0000   K 4.5 08/28/2020 0254   K 4.2 08/26/2020 0047   K 3.6 08/25/2020 0452   CL 106 08/28/2020 0254   CL 108 08/26/2020 0047   CL 107 08/25/2020 0452   CO2 23 08/28/2020 0254   CO2 18 (L) 08/26/2020 0047   CO2 23 08/25/2020 0452   GLUCOSE 70 08/28/2020 0254   GLUCOSE 119 (H) 08/26/2020 0047   GLUCOSE 79 08/25/2020 0452   BUN 33 (H) 08/28/2020 0254   BUN 36 (H) 08/26/2020 0047   BUN 34 (H) 08/25/2020 0452   BUN 41 (A) 07/20/2020 0000   BUN 57 (A) 06/22/2020 0000   BUN 54 (A) 05/25/2020  0000   CREATININE 2.78 (H) 08/28/2020 0254   CREATININE 2.57 (H) 08/26/2020 0047   CREATININE 2.53 (H) 08/25/2020 0452   CALCIUM 8.0 (L) 08/28/2020 0254   CALCIUM 7.8 (L) 08/26/2020 0047   CALCIUM 8.1 (L) 08/25/2020 0452   GFRNONAA 17 (L) 08/28/2020 0254   GFRNONAA 19 (L) 08/26/2020 0047   GFRNONAA 20 (L) 08/25/2020 0452   CBC    Component Value Date/Time   WBC 4.2 08/29/2020 0227   RBC 2.83 (L) 08/29/2020 0227   HGB 8.4 (L) 08/29/2020 0449   HCT 26.7 (L) 08/29/2020 0449   PLT 208 08/29/2020 0227   MCV 89.0 08/29/2020 0227   MCV 90 06/22/2020 0000   MCH 27.6 08/29/2020 0227   MCHC 31.0 08/29/2020 0227   RDW 17.1 (H) 08/29/2020 0227   LYMPHSABS 0.5 (L) 08/16/2020 1556   MONOABS 0.2 08/16/2020 1556   EOSABS 0.1 08/16/2020 1556   BASOSABS 0.0 08/16/2020 1556   HEPATIC Function Panel Recent Labs    08/16/20 1556 08/18/20 0232 08/19/20 0158  PROT 6.0* 5.0* 5.0*   HEMOGLOBIN A1C No components found for: HGA1C,  MPG CARDIAC ENZYMES No results found for: CKTOTAL, CKMB, CKMBINDEX, TROPONINI BNP No results for input(s): PROBNP in the last 8760 hours. TSH Recent Labs    08/16/20 1556  TSH 2.903   CHOLESTEROL  No results for input(s): CHOL in the last 8760 hours.  Scheduled Meds: . carvedilol  25 mg Oral BID  . Chlorhexidine Gluconate Cloth  6 each Topical Daily  . hydrALAZINE  25 mg Oral Q8H  . insulin aspart  0-9 Units Subcutaneous TID WC  . insulin glargine  5 Units Subcutaneous QHS  . pantoprazole  40 mg Oral BID  . polyethylene glycol  17 g Oral BID  . predniSONE  10 mg Oral Q breakfast  . rOPINIRole  3 mg Oral QHS  . rosuvastatin  20 mg Oral Daily  . sertraline  100 mg Oral QHS  . sodium bicarbonate  1,300 mg Oral BID  . sulfamethoxazole-trimethoprim  1 tablet Oral Once per day on Mon Wed Fri  . tacrolimus  1 mg Oral TID   Continuous Infusions: . sodium chloride 10 mL/hr at 08/26/20 1842  . 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   LOS: 12 days   Time spent including chart review, lab review, examination, discussion with patient/Family and nurse : 30 min   Dixie Dials  MD  08/29/2020, 9:55 AM

## 2020-08-29 NOTE — Progress Notes (Signed)
[place] for Infectious Disease    Date of Admission:  08/16/2020   Total days of antibiotics day 11          ID: Ruth Riley is a 75 y.o. 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 Principal Problem:   Fungal endocarditis Active Problems:   Anemia in chronic kidney disease   Acute metabolic encephalopathy   Diabetes mellitus type 2 in nonobese [facility])   Essential hypertension   H/O kidney transplant   Stage 4 chronic kidney disease (HCC)   Bilateral wrist pain   Multifocal pneumonia   History of COVID-19   Chronic back pain   Blood in the stool   Heme positive stool   Acute on chronic anemia   Inflammation of colonic mucosa   Angiodysplasia of stomach with hemorrhage   Left hand fracture   Gastrointestinal hemorrhage associated with duodenitis   Mural thrombus of cardiac apex   Goals of care, counseling/discussion    Subjective: Had difficult night since she had prolonged episode of epistaxis.   Medications:  . carvedilol  25 mg Oral BID  . Chlorhexidine Gluconate Cloth  6 each Topical Daily  . hydrALAZINE  25 mg Oral Q8H  . insulin aspart  0-9 Units Subcutaneous TID WC  . insulin glargine  5 Units Subcutaneous QHS  . pantoprazole  40 mg Oral BID  . polyethylene glycol  17 g Oral BID  . predniSONE  10 mg Oral Q breakfast  . rOPINIRole  3 mg Oral QHS  . rosuvastatin  20 mg Oral Daily  . sertraline  100 mg Oral QHS  . sodium bicarbonate  1,300 mg Oral BID  . sulfamethoxazole-trimethoprim  1 tablet Oral Once per day on Mon Wed Fri  . tacrolimus  1 mg Oral TID    Objective: Vital signs in last 24 hours: Temp:  [97.6 F (36.4 C)-98.9 F (37.2 C)] 98 F (36.7 C) (04/18 1139) Pulse Rate:  [69-75] 69 (04/18 1139) Resp:  [15-20] 20 (04/18 1139) BP: (149-190)/(81-115) 190/115 (04/18 1139) SpO2:  [90 %-96 %] 90 % (04/18 1139) Physical Exam  Constitutional:  oriented to person,  place, and time. appears well-developed and well-nourished. No distress.  HENT: [place]/AT, PERRLA, no scleral icterus Mouth/Throat: Oropharynx is clear and moist. No oropharyngeal exudate.  Cardiovascular: Normal rate, regular rhythm and normal heart sounds. Exam reveals no gallop and no friction rub.  No murmur heard.  Pulmonary/Chest: Effort normal and breath sounds normal. No respiratory distress.  has no wheezes.  Neck = supple, no nuchal rigidity Abdominal: Soft. Bowel sounds are normal.  exhibits no distension. There is no tenderness.  Ext: right arm wrapped Neurological: alert and oriented to person, place, and time.  Skin: Skin is warm and dry. No rash noted. No erythema.  Psychiatric: a normal mood and affect.  behavior is normal.    Lab Results Recent Labs    08/28/20 0254 08/28/20 1707 08/29/20 0227 08/29/20 0449  WBC 5.5 5.2 4.2  --   HGB 7.6* 8.4* 7.8* 8.4*  HCT 24.3* 26.9* 25.2* 26.7*  NA 134*  --   --   --   K 4.5  --   --   --   CL 106  --   --   --   CO2 23  --   --   --   BUN 33*  --   --   --  CREATININE 2.78*  --   --   --     Microbiology: 4/5 c.albicans on blood cx  4/7 blood cx NGTD Studies/Results: No results found. 4/11 tunneled catheter placed 4/13 TEE IMPRESSIONS    1. Left ventricular ejection fraction, by estimation, is 55 to 60%. The  left ventricle has normal function. The left ventricle has no regional  wall motion abnormalities.  2. Right ventricular systolic function is normal. The right ventricular  size is normal.  3. Left atrial size was moderately dilated. No left atrial/left atrial  appendage thrombus was detected.  4. Possible large linear clot.  5. The pericardial effusion is circumferential. There is no evidence of  cardiac tamponade.  6. Multiple small vegetation on the mitral valve.  7. The mitral valve is degenerative. Mild mitral valve regurgitation.  8. Aortic valve vegetation is visualized on the left.  9.  The aortic valve is tricuspid. There is moderate calcification of the  aortic valve. There is mild thickening of the aortic valve. Aortic valve  regurgitation is mild. Mild to moderate aortic valve  sclerosis/calcification is present, without any  evidence of aortic stenosis.  10. There is mild (Grade II) atheroma plaque involving the aortic root,  ascending aorta and descending aorta.  11. The inferior vena cava is normal in size with <50% respiratory  variability, suggesting right atrial pressure of 8 mmHg.  12. Agitated saline contrast bubble study was positive with shunting  observed after >6 cardiac cycles suggestive of intrapulmonary shunting.   Conclusion(s)/Recommendation(s): Findings are concerning for  vegetation/infective endocarditis as detailed above.   Assessment/Plan: Fungal endocarditis = currently on anidulafungin, day 11. We will finish out 14 days of anidulafungin then transition to fluconazole. In the meantime, due to drug interaction, her tacrolimus dosing will be halved and will check tacro/renal function twice per week per renal. She should also have weekly cmp at first to see that she tolerates azole. Since she is not a surgical candidate, and has large burden of disease, she may need fluconazole indefinitely.  Drug interactions = will also decrease her rosuvastatin dose while she is on fluconazole. Recommend to check lipid profile  Epistaxis = currently on anticoagulation for mural thrombus. Defer to primary team for management  ESRD with renal txp = continue on bactrim for oi proph, pred '10mg'$  daily, tacro dosing to decrease per renal team.  [facility] for Infectious Diseases Cell: [phone] Pager: [phone]  08/29/2020, 12:11 PM

## 2020-08-29 NOTE — Progress Notes (Signed)
PROGRESS NOTE        PATIENT DETAILS Name: Ruth Riley Age: 75 y.o. Sex: female Date of Birth: Feb 12, 1946 Admit Date: 08/16/2020 Admitting Physician Karmen Bongo, MD [id number], [name], MD  Brief Narrative: Patient is a 75 y.o. female with history of renal transplant, CKD stage IV, DM-2, HTN, HLD-presented to the hospital with multiple orthopedic complaints-s/p multiple falls with both right/left hand injury, confusion, worsening anemia-she was subsequently transferred from [facility] to [facility] for GI evaluation.  See below for further details.   Significant events: 4/5>> transferred from [facility] to [facility]  Significant studies: 4/6>> chest x-ray: No active disease. 4/8>> x-ray left hand: No acute fracture or dislocation. 4/8>> MRI right hand/wrist: No fracture/osteomyelitis-severe flexor Tina synovitis, second/third MCP joint erosions, enlarged radiocarpal midcarpal joint infusion-suggestive of inflammatory arthropathy. 4/8>> Echo: EF [id number], grade 2 diastolic dysfunction 0000000 CT abdomen/pelvis: No perforation/focal inflammation in the bowel-autosomal dominant polycystic kidney disease.  Mild anasarca. 4/13>> TEE: Vegetation on MV, TV, AV, linear mobile clot in RA cavity  Antimicrobial therapy: Rocephin: 4/6>> 4/8 Zithromax: 4/6>> 4/10 Cefdinir: 4/9>> 4/10 Eraxis: 04/7>>  Microbiology data: 4/5>> blood culture: Candida albicans 4/7>> blood culture: No growth  Pathology: 4/12>> ascending colon biopsy: No inflammation/dysplasia/carcinoma  Procedures : 4/11>> right IJ tunneled CVC 4/12>> EGD: 2 angiectasia is in the stomach-1 with active oozing-s/p APC. 4/12>> colonoscopy: Edema/adherent blood in the proximal ascending colon/cecum-?  Ischemic colitis.  Diverticulosis in the sigmoid/descending colon. 4/13>> TEE: Vegetation on MV, TV, AV, linear mobile clot in RA cavity  Consults: GI, ID, IR, cards,CTVS  DVT Prophylaxis  : SCDs Start: 08/16/20 1429   Subjective: Had epistaxis last night-no chest pain or shortness of breath.  Lying comfortably in bed.  Appears much more comfortable compared to the day before.  Assessment/Plan: Acute metabolic encephalopathy: Probably related to fungemia/pneumonia-resolved- her mentation is back to baseline.  Multifocal pneumonia-recent history of COVID-19 infection: Overall improved-completed course of antibiotics.  Cultures as above.  Fungemia with endocarditis involving the mitral, tricuspid and aortic valve On Eraxis-repeat blood cultures negative-ID following.  Not a candidate for surgery-per CT views-patient does not want surgery as well.  Difficult situation-as SNF discharge is planned-however SNF unable to cover the cost of Eraxis-discussed with infectious disease-plan is to continue 2-3 more days of Eraxis-then switch to fluconazole.  Given interaction of fluconazole with Prograf-spoke with nephrologist Dr. Webb-recommendations are to decrease Prograf to 1 mg in a.m. and 0.5 mg nightly-and to obtain biweekly Prograf levels and renal function while on fluconazole.  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  Patient was started on IV heparin-however she developed epistaxis last night-discussed with Dr. Doylene Canard again on 4/18-he does not advise any further anticoagulation from here out.    Upper GI bleeding with acute blood loss anemia: EGD/colonoscopy as above-no further obvious GI loss apparent-has not had a bowel movement for the past 2-3 days.  Hemoglobin continues to drop-cautiously continue with IV heparin-repeat CBC later today.   Continue PPI.  Biopsy from colonoscopy was negative for malignancy or inflammation.  Continue to watch closely-repeat CBC in a.m.  AKI on CKD stage IV-renal transplantation: AKI hemodynamically mediated-has trended down-and now close  to baseline.   History of renal transplantation: Continue steroids/tacrolimus-on prophylactic Bactrim.  See above regarding plans to reduce dosage of Prograf when fluconazole has been started.  HTN: BP controlled-continue Coreg, hydralazine  HLD: Continue statin  DM-2: CBGs stable-but with a.m. hypoglycemia-decrease Lantus to 5 units-continue SSI.  Follow and adjust.   Recent Labs    08/28/20 2051 08/29/20 0740 08/29/20 1141  GLUCAP 154* 98 155*   Right wrist pain/swelling: See above regarding MRI imaging-prior MD discussed with hand surgery-Dr. Hilbert Corrigan thought that this was related to inflammatory changes-recommendations were to keep splint for extended periods and steroids.  Right wrist swelling seems to have improved compared to the past few days.  Chronic back pain: Continue supportive care.  Deconditioning/debility: Due to acute illness-SNF on discharge.   Diet: Diet Order            Diet Carb Modified Fluid consistency: Thin; Room service appropriate? Yes  Diet effective now                  Code Status: DNR  Family Communication: Spouse-David [phone] on 4/18-asked me to call him back in 10 to 15 minutes.   Disposition Plan:  Status is: Inpatient  Remains inpatient appropriate because:Inpatient level of care appropriate due to severity of illness   Dispo: The patient is from: Home              Anticipated d/c is to: Home              Patient currently is not medically stable to d/c.   Difficult to place patient No   Barriers to Discharge: Fungemia-on Eraxis-on IV heparin to ensure no more GI bleeding before transitioning to SQ Lovenox.  Also awaiting SNF bed.  Antimicrobial agents: Anti-infectives (From admission, onward)   Start     Dose/Rate Route Frequency Ordered Stop   08/25/20 1000  anidulafungin (ERAXIS) 200 mg in sodium chloride 0.9 % 200 mL IVPB        200 mg 78 mL/hr over 200 Minutes Intravenous Daily 08/24/20 1445 09/01/20  0959   08/20/20 1000  cefdinir (OMNICEF) capsule 300 mg  Status:  Discontinued        300 mg Oral Daily 08/19/20 0918 08/19/20 0936   08/20/20 1000  cefdinir (OMNICEF) capsule 300 mg        300 mg Oral Daily 08/19/20 0936 08/21/20 0922   08/19/20 1015  azithromycin (ZITHROMAX) tablet 500 mg  Status:  Discontinued        500 mg Oral Daily 08/19/20 0916 08/19/20 0936   08/19/20 1015  azithromycin (ZITHROMAX) tablet 500 mg        500 mg Oral Daily 08/19/20 0936 08/21/20 0921   08/19/20 1000  anidulafungin (ERAXIS) 100 mg in sodium chloride 0.9 % 100 mL IVPB  Status:  Discontinued        100 mg 78 mL/hr over 100 Minutes Intravenous Daily 08/18/20 0247 08/24/20 1445   08/18/20 0400  anidulafungin (ERAXIS) 200 mg in sodium chloride 0.9 % 200 mL IVPB        200 mg 78 mL/hr over 200 Minutes Intravenous  Once 08/18/20 0247 08/18/20 0704   08/17/20 0900  sulfamethoxazole-trimethoprim (BACTRIM) 400-80 MG per tablet 1 tablet        1 tablet Oral Once per day on Mon Wed Fri 08/16/20 1430     08/17/20 0800  cefTRIAXone (ROCEPHIN) 2 g in sodium chloride 0.9 % 100 mL IVPB  Status:  Discontinued        2 g 200 mL/hr over 30  Minutes Intravenous Every 24 hours 08/16/20 1430 08/19/20 0918   08/17/20 0800  azithromycin (ZITHROMAX) 500 mg in sodium chloride 0.9 % 250 mL IVPB  Status:  Discontinued        500 mg 250 mL/hr over 60 Minutes Intravenous Every 24 hours 08/16/20 1430 08/19/20 0916       Time spent: 25 minutes-Greater than 50% of this time was spent in counseling, explanation of diagnosis, planning of further management, and coordination of care.  MEDICATIONS: Scheduled Meds: . carvedilol  25 mg Oral BID  . Chlorhexidine Gluconate Cloth  6 each Topical Daily  . hydrALAZINE  25 mg Oral Q8H  . insulin aspart  0-9 Units Subcutaneous TID WC  . insulin glargine  5 Units Subcutaneous QHS  . pantoprazole  40 mg Oral BID  . polyethylene glycol  17 g Oral BID  . predniSONE  10 mg Oral Q breakfast   . rOPINIRole  3 mg Oral QHS  . rosuvastatin  20 mg Oral Daily  . sertraline  100 mg Oral QHS  . sodium bicarbonate  1,300 mg Oral BID  . sulfamethoxazole-trimethoprim  1 tablet Oral Once per day on Mon Wed Fri  . tacrolimus  1 mg Oral TID   Continuous Infusions: . sodium chloride 10 mL/hr at 08/26/20 1842  . anidulafungin 200 mg (08/29/20 0907)   PRN Meds:.acetaminophen **OR** acetaminophen, albuterol, calcium carbonate (dosed in mg elemental calcium), camphor-menthol **AND** hydrOXYzine, docusate sodium, feeding supplement (NEPRO CARB STEADY), guaiFENesin, hydrALAZINE, HYDROcodone-acetaminophen, lidocaine, lip balm, ondansetron **OR** ondansetron (ZOFRAN) IV, oxyCODONE, oxymetazoline, sodium chloride, sorbitol, zolpidem   PHYSICAL EXAM: Vital signs: Vitals:   08/29/20 0100 08/29/20 0334 08/29/20 0736 08/29/20 1139  BP: (!) 184/94 (!) 176/81 (!) 176/99 (!) 190/115  Pulse:  72 75 69  Resp:  '19 20 20  '$ Temp:  98.6 F (37 C) 97.6 F (36.4 C) 98 F (36.7 C)  TempSrc:  Axillary Axillary Oral  SpO2:  94% 90% 90%  Weight:      Height:       Filed Weights   08/20/20 0339 08/21/20 0347 08/24/20 1205  Weight: 57 kg 58 kg 58 kg   Body mass index is 24.16 kg/m.   Gen Exam:Alert awake-not in any distress HEENT:atraumatic, normocephalic Chest: B/L clear to auscultation anteriorly CVS:S1S2 regular Abdomen:soft non tender, non distended Extremities:no edema Neurology: Non focal Skin: no rash  I have personally reviewed following labs and imaging studies  LABORATORY DATA: CBC: Recent Labs  Lab 08/26/20 0047 08/27/20 0021 08/28/20 0254 08/28/20 1707 08/29/20 0227 08/29/20 0449  WBC 5.5 6.0 5.5 5.2 4.2  --   HGB 9.0* 8.1* 7.6* 8.4* 7.8* 8.4*  HCT 28.0* 26.3* 24.3* 26.9* 25.2* 26.7*  MCV 86.7 89.5 89.0 89.1 89.0  --   PLT 146* 211 199 214 208  --     Basic Metabolic Panel: Recent Labs  Lab 08/25/20 0452 08/26/20 0047 08/28/20 0254  NA 138 133* 134*  K 3.6 4.2 4.5   CL 107 108 106  CO2 23 18* 23  GLUCOSE 79 119* 70  BUN 34* 36* 33*  CREATININE 2.53* 2.57* 2.78*  CALCIUM 8.1* 7.8* 8.0*    GFR: Estimated Creatinine Clearance: 14.8 mL/min (A) (by C-G formula based on SCr of 2.78 mg/dL (H)).  Liver Function Tests: No results for input(s): AST, ALT, ALKPHOS, BILITOT, PROT, ALBUMIN in the last 168 hours. No results for input(s): LIPASE, AMYLASE in the last 168 hours. No results for input(s): AMMONIA in the last  168 hours.  Coagulation Profile: No results for input(s): INR, PROTIME in the last 168 hours.  Cardiac Enzymes: No results for input(s): CKTOTAL, CKMB, CKMBINDEX, TROPONINI in the last 168 hours.  BNP (last 3 results) No results for input(s): PROBNP in the last 8760 hours.  Lipid Profile: No results for input(s): CHOL, HDL, LDLCALC, TRIG, CHOLHDL, LDLDIRECT in the last 72 hours.  Thyroid Function Tests: No results for input(s): TSH, T4TOTAL, FREET4, T3FREE, THYROIDAB in the last 72 hours.  Anemia Panel: No results for input(s): VITAMINB12, FOLATE, FERRITIN, TIBC, IRON, RETICCTPCT in the last 72 hours.  Urine analysis: No results found for: COLORURINE, APPEARANCEUR, LABSPEC, PHURINE, GLUCOSEU, HGBUR, BILIRUBINUR, KETONESUR, PROTEINUR, UROBILINOGEN, NITRITE, LEUKOCYTESUR  Sepsis Labs: Lactic Acid, Venous No results found for: LATICACIDVEN  MICROBIOLOGY: No results found for this or any previous visit (from the past 240 hour(s)).  RADIOLOGY STUDIES/RESULTS: No results found.   LOS: 12 days   Oren Binet, MD  [facility]    To contact the attending provider between 7A-7P or the covering provider during after hours 7P-7A, please log into the web site [email or web address] and access using universal [place] password for that web site. If you do not have the password, please call the hospital operator.  08/29/2020, 11:43 AM

## 2020-08-29 NOTE — TOC Progression Note (Signed)
Transition of Care [facility]) - Progression Note    Patient Details  Name: Shatiqua Nguyen MRN: [id number] Date of Birth: Aug 15, 1945  Transition of [facility]) CM/SW [place], LCSW Phone Number: 08/29/2020, 10:06 AM  Clinical Narrative:    CSW received call from patient's spouse. He is requesting patient be sent discharged to [facility]. CSW made him aware that SNF is unable to accept patient on her current IV medications due to cost. He is requesting patient transfer to [facility] where she had her kidney transplant because he is getting frustrated. He is requesting all patient's physicians (Gertie Fey, Nephro, and the hospitalist) get together with him to discuss her care plan.    Expected Discharge Plan: [place] Barriers to Discharge: Continued Medical Work up  Expected Discharge Plan and Services Expected Discharge Plan: [place] In-house Referral: Clinical Social Work Discharge Planning Services: CM Consult Post Acute Care Choice: [place] Living arrangements for the past 2 months: Apartment                                       Social Determinants of Health (SDOH) Interventions    Readmission Risk Interventions No flowsheet data found.

## 2020-08-29 NOTE — Progress Notes (Signed)
ANTICOAGULATION CONSULT NOTE - Follow Up Consult  Pharmacy Consult for Heparin Indication: Intracardiac clot  Patient Measurements: Height: '5\' 1"'$  (154.9 cm) Weight: 58 kg (127 lb 13.9 oz) IBW/kg (Calculated) : 47.8 Heparin Dosing Weight:    Vital Signs: Temp: 98 F (36.7 C) (04/18 1139) Temp Source: Oral (04/18 1139) BP: 190/115 (04/18 1139) Pulse Rate: 69 (04/18 1139)  Labs: Recent Labs    08/27/20 0021 08/28/20 0254 08/28/20 1707 08/29/20 0227 08/29/20 0449  HGB 8.1* 7.6* 8.4* 7.8* 8.4*  HCT 26.3* 24.3* 26.9* 25.2* 26.7*  PLT 211 199 214 208  --   HEPARINUNFRC 0.43 0.39  --  0.41  --   CREATININE  --  2.78*  --   --   --     Estimated Creatinine Clearance: 14.8 mL/min (A) (by C-G formula based on SCr of 2.78 mg/dL (H)).   Assessment: On 4/13, mobile clot was visualized in RA cavity on TEE. Recent GI bleeding. Pharmacy consulted to dose IV heparin for anticoagulation.  Heparin level therapeutic 0.39 CBC Hgb: 7.6, Plt 199   Heparin has been on hold for epitaxis. D/w Dr. Sloan Leiter this AM and we will cont to hold heparin.   Goal of Therapy:  Heparin level 0.3-0.5 units/ml Monitor platelets by anticoagulation protocol: Yes   Plan:  Hold heparin F/u longterm anticoagulation plans    Onnie Boer, PharmD, BCIDP, AAHIVP, CPP Infectious Disease Pharmacist 08/29/2020 12:56 PM

## 2020-08-29 NOTE — Significant Event (Addendum)
Rapid Response Event Note   Reason for Call :  "coughing up blood and on a heparin drip"  Initial Focused Assessment:  Patient alert and oriented x 4 skin warm and dry but slightly pale BP intially 171/119 now 182/92 blood noted in right nare and small clots had been coughed up in wash cloth Oxygen 93%RA RR 26     Interventions:  Primary RN stopped heparin drip Lungs diminished bilateral Had patient blow nose to remove clot in nare Hold pressure Notified MD Rathore Mild bleeding noted in right nare Plan of Care:  Repeat H/H, type and screen, Rathore MD to contact Pharm Notify RRT immediately if increased bleeding Notify if bleeding gets worse  Event Summary:   MD Notified: [id number] Call Time: 0430 Arrival Time: [id number] End Time: [id number]  Charlyne Quale, RN

## 2020-08-29 NOTE — Consult Note (Signed)
Ref: [name], MD   Subjective:  Off heparin for nasal bleed. Afebrile. VS stable.  Objective:  Vital Signs in the last 24 hours: Temp:  [97.6 F (36.4 C)-98.9 F (37.2 C)] 97.6 F (36.4 C) (04/18 0736) Pulse Rate:  [69-75] 75 (04/18 0736) Cardiac Rhythm: Normal sinus rhythm (04/18 0738) Resp:  [15-20] 20 (04/18 0736) BP: (149-188)/(81-106) 176/99 (04/18 0736) SpO2:  [90 %-96 %] 90 % (04/18 0736)  Physical Exam: BP Readings from Last 1 Encounters:  08/29/20 (!) 176/99     Wt Readings from Last 1 Encounters:  08/24/20 58 kg    Weight change:  Body mass index is 24.16 kg/m. HEENT: [place]/AT, Eyes-Hazel, Conjunctiva-Pale, Sclera-Non-icteric Neck: No JVD, No bruit, Trachea midline. Lungs:  Clear, Bilateral. Cardiac:  Regular rhythm, normal S1 and S2, no S3. III/VI systolic and II/VI diastolic murmur. Abdomen:  Soft, non-tender. BS present. Extremities:  No edema present. No cyanosis. No clubbing. CNS: AxOx2, Cranial nerves grossly intact, moves all 4 extremities.  Skin: Warm and dry.   Intake/Output from previous day: 04/17 0701 - 04/18 0700 In: 744.4 [P.O.:240; I.V.:244.4; IV Piggyback:260] Out: 650 [Urine:650]    Lab Results: BMET    Component Value Date/Time   NA 134 (L) 08/28/2020 0254   NA 133 (L) 08/26/2020 0047   NA 138 08/25/2020 0452   NA 138 07/20/2020 0000   NA 133 (A) 06/22/2020 0000   NA 141 05/25/2020 0000   K 4.5 08/28/2020 0254   K 4.2 08/26/2020 0047   K 3.6 08/25/2020 0452   CL 106 08/28/2020 0254   CL 108 08/26/2020 0047   CL 107 08/25/2020 0452   CO2 23 08/28/2020 0254   CO2 18 (L) 08/26/2020 0047   CO2 23 08/25/2020 0452   GLUCOSE 70 08/28/2020 0254   GLUCOSE 119 (H) 08/26/2020 0047   GLUCOSE 79 08/25/2020 0452   BUN 33 (H) 08/28/2020 0254   BUN 36 (H) 08/26/2020 0047   BUN 34 (H) 08/25/2020 0452   BUN 41 (A) 07/20/2020 0000   BUN 57 (A) 06/22/2020 0000   BUN 54 (A) 05/25/2020 0000   CREATININE 2.78 (H) 08/28/2020 0254    CREATININE 2.57 (H) 08/26/2020 0047   CREATININE 2.53 (H) 08/25/2020 0452   CALCIUM 8.0 (L) 08/28/2020 0254   CALCIUM 7.8 (L) 08/26/2020 0047   CALCIUM 8.1 (L) 08/25/2020 0452   GFRNONAA 17 (L) 08/28/2020 0254   GFRNONAA 19 (L) 08/26/2020 0047   GFRNONAA 20 (L) 08/25/2020 0452   CBC    Component Value Date/Time   WBC 4.2 08/29/2020 0227   RBC 2.83 (L) 08/29/2020 0227   HGB 8.4 (L) 08/29/2020 0449   HCT 26.7 (L) 08/29/2020 0449   PLT 208 08/29/2020 0227   MCV 89.0 08/29/2020 0227   MCV 90 06/22/2020 0000   MCH 27.6 08/29/2020 0227   MCHC 31.0 08/29/2020 0227   RDW 17.1 (H) 08/29/2020 0227   LYMPHSABS 0.5 (L) 08/16/2020 1556   MONOABS 0.2 08/16/2020 1556   EOSABS 0.1 08/16/2020 1556   BASOSABS 0.0 08/16/2020 1556   HEPATIC Function Panel Recent Labs    08/16/20 1556 08/18/20 0232 08/19/20 0158  PROT 6.0* 5.0* 5.0*   HEMOGLOBIN A1C No components found for: HGA1C,  MPG CARDIAC ENZYMES No results found for: CKTOTAL, CKMB, CKMBINDEX, TROPONINI BNP No results for input(s): PROBNP in the last 8760 hours. TSH Recent Labs    08/16/20 1556  TSH 2.903   CHOLESTEROL No results for input(s): CHOL in the  last 8760 hours.  Scheduled Meds: . carvedilol  25 mg Oral BID  . Chlorhexidine Gluconate Cloth  6 each Topical Daily  . hydrALAZINE  25 mg Oral Q8H  . insulin aspart  0-9 Units Subcutaneous TID WC  . insulin glargine  5 Units Subcutaneous QHS  . pantoprazole  40 mg Oral BID  . polyethylene glycol  17 g Oral BID  . predniSONE  10 mg Oral Q breakfast  . rOPINIRole  3 mg Oral QHS  . rosuvastatin  20 mg Oral Daily  . sertraline  100 mg Oral QHS  . sodium bicarbonate  1,300 mg Oral BID  . sulfamethoxazole-trimethoprim  1 tablet Oral Once per day on Mon Wed Fri  . tacrolimus  1 mg Oral TID   Continuous Infusions: . sodium chloride 10 mL/hr at 08/26/20 1842  . 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 Answered questions on nose bleed and discontinuation of heparin.   LOS: 12 days   Time spent including chart review, lab review, examination, discussion with patient/Nurse : 30 min   Dixie Dials  MD  08/29/2020, 9:43 AM

## 2020-08-30 ENCOUNTER — Other Ambulatory Visit: Payer: Self-pay | Admitting: Pharmacist

## 2020-08-30 DIAGNOSIS — R531 Weakness: Secondary | ICD-10-CM

## 2020-08-30 DIAGNOSIS — R040 Epistaxis: Secondary | ICD-10-CM

## 2020-08-30 DIAGNOSIS — I219 Acute myocardial infarction, unspecified: Secondary | ICD-10-CM

## 2020-08-30 LAB — BASIC METABOLIC PANEL
Anion gap: 6 (ref 5–15)
BUN: 29 mg/dL — ABNORMAL HIGH (ref 8–23)
CO2: 22 mmol/L (ref 22–32)
Calcium: 8.6 mg/dL — ABNORMAL LOW (ref 8.9–10.3)
Chloride: 106 mmol/L (ref 98–111)
Creatinine, Ser: 2.85 mg/dL — ABNORMAL HIGH (ref 0.44–1.00)
GFR, Estimated: 17 mL/min — ABNORMAL LOW (ref >60)
Glucose, Bld: 106 mg/dL — ABNORMAL HIGH (ref 70–99)
Potassium: 5.2 mmol/L — ABNORMAL HIGH (ref 3.5–5.1)
Sodium: 134 mmol/L — ABNORMAL LOW (ref 135–145)

## 2020-08-30 LAB — CBC
HCT: 25.1 % — ABNORMAL LOW (ref 36.0–46.0)
Hemoglobin: 7.7 g/dL — ABNORMAL LOW (ref 12.0–15.0)
MCH: 27.4 pg (ref 26.0–34.0)
MCHC: 30.7 g/dL (ref 30.0–36.0)
MCV: 89.3 fL (ref 80.0–100.0)
Platelets: 221 10*3/uL (ref 150–400)
RBC: 2.81 MIL/uL — ABNORMAL LOW (ref 3.87–5.11)
RDW: 16.9 % — ABNORMAL HIGH (ref 11.5–15.5)
WBC: 4.4 10*3/uL (ref 4.0–10.5)
nRBC: 0 % (ref 0.0–0.2)

## 2020-08-30 LAB — GLUCOSE, CAPILLARY
Glucose-Capillary: 102 mg/dL — ABNORMAL HIGH (ref 70–99)
Glucose-Capillary: 181 mg/dL — ABNORMAL HIGH (ref 70–99)
Glucose-Capillary: 139 mg/dL — ABNORMAL HIGH (ref 70–99)
Glucose-Capillary: 165 mg/dL — ABNORMAL HIGH (ref 70–99)

## 2020-08-30 MED ORDER — SODIUM CHLORIDE 0.9% FLUSH
10.0000 mL | Freq: Two times a day (BID) | INTRAVENOUS | Status: DC
  Administered 2020-08-30 – 2020-09-01 (×4): 10 mL

## 2020-08-30 MED ORDER — SODIUM CHLORIDE 0.9% FLUSH
10.0000 mL | INTRAVENOUS | Status: DC | PRN
Start: 2020-08-30 — End: 2020-09-01

## 2020-08-30 MED ORDER — DARBEPOETIN ALFA 200 MCG/0.4ML IJ SOSY
200.0000 ug | PREFILLED_SYRINGE | INTRAMUSCULAR | Status: DC
Start: 2020-08-31 — End: 2020-08-30

## 2020-08-30 MED ORDER — SODIUM ZIRCONIUM CYCLOSILICATE 10 G PO PACK
10.0000 g | PACK | Freq: Two times a day (BID) | ORAL | Status: AC
  Administered 2020-08-30 – 2020-08-31 (×2): 10 g via ORAL
  Filled 2020-08-30 (×2): qty 1

## 2020-08-30 MED ORDER — ENOXAPARIN SODIUM 30 MG/0.3ML ~~LOC~~ SOLN
30.0000 mg | SUBCUTANEOUS | Status: DC
  Administered 2020-08-30 – 2020-09-01 (×3): 30 mg via SUBCUTANEOUS
  Filled 2020-08-30 (×3): qty 0.3

## 2020-08-30 MED ORDER — LIDOCAINE 5 % EX PTCH
1.0000 | MEDICATED_PATCH | Freq: Every day | CUTANEOUS | Status: DC
  Administered 2020-08-30 – 2020-09-01 (×3): 1 via TRANSDERMAL
  Filled 2020-08-30 (×3): qty 1

## 2020-08-30 NOTE — Progress Notes (Signed)
Occupational Therapy Treatment Patient Details Name: Ruth Riley MRN: [id number] DOB: 03-10-1946 Today's Date: 08/30/2020    History of present illness Pt is 75 y.o. female who presented to [facility] with confusion, SoB, and cough and was transferred to [facility] and admitted 08/16/20. Pt being treated for anemia, bilateral PNA and possible GI bleed.  On 4/13 TEE found  small vegetations on MV, TV, AV and a mobile clot in the RA cavity. Pt started on heparin. Pt thought to have fx L 4th finger and was splinted - later found to not be broken and splint off.  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  PMH: DM; HTN; and h/o renal transplant with current stage 4 CKD (baseline 3.7) with anemia (baseline 9), COVID infection in early February. hx of multiple falls and orthopedic complaints most recently L ring finger fx.   OT comments  Pt progressing gradually with OT goals updated and extended accordingly. On entry, pt reporting bowel incontinence requiring Max A for clean up bed level and in standing. Pt able to progress transfers to Min A using R platform walker, continuing to benefit from cues for sequencing. Pt notably fatigued after LB ADLs and transfer to chair - VSS on RA. DC recs remain appropriate.    Follow Up Recommendations  SNF    Equipment Recommendations  3 in 1 bedside commode;Other (comment) (Right platform walker pending progress)    Recommendations for Other Services      Precautions / Restrictions Precautions Precautions: Fall Precaution Comments: multple falls Required Braces or Orthoses: Other Brace Splint/Cast: R wrist brace at all times; foam arm  elevator Restrictions Weight Bearing Restrictions: No Other Position/Activity Restrictions: No weightbearing restrictions ordered - pt unaware of any restrictions and reports R wrist injury present prior to admission       Mobility Bed Mobility Overal bed mobility: Needs Assistance Bed Mobility: Supine to Sit     Supine to sit: HOB elevated;Mod assist     General bed mobility comments: Assist to bring legs off of bed and elevate trunk into sitting and bring hips to EOB    Transfers Overall transfer level: Needs assistance Equipment used: Right platform walker Transfers: Sit to/from Stand;Stand Pivot Transfers Sit to Stand: Min assist Stand pivot transfers: Min assist       General transfer comment: Light MIn A to power up from bed with cues for hand placement. Light Min A in turning walker to recliner appropriately. increased time    Balance Overall balance assessment: Needs assistance Sitting-balance support: Feet supported;No upper extremity supported Sitting balance-Leahy Scale: Fair     Standing balance support: Bilateral upper extremity supported;During functional activity Standing balance-Leahy Scale: Poor Standing balance comment: reliant on UE support                           ADL either performed or assessed with clinical judgement   ADL Overall ADL's : Needs assistance/impaired                     Lower Body Dressing: Maximal assistance;Sit to/from stand Lower Body Dressing Details (indicate cue type and reason): Max A to don socks     Toileting- Clothing  Manipulation and Hygiene: Maximal assistance;Sit to/from stand Toileting - Clothing Manipulation Details (indicate cue type and reason): Max A for cleanup after BM incontinence in bed, rolling and sit to stands       General ADL Comments: motivated to progress and get OOB though noted with BM incontinence on entry, assist needed for cleanup and transfer to recliner with fatigue  noted after these tasks     Vision   Vision Assessment?: No apparent visual deficits   Perception     Praxis      Cognition Arousal/Alertness: Awake/alert Behavior During Therapy: WFL for tasks assessed/performed Overall Cognitive Status: Impaired/Different from baseline Area of Impairment: Following commands;Problem solving;Memory                     Memory: Decreased short-term memory;Decreased recall of precautions Following Commands: Follows one step commands with increased time;Follows multi-step commands with increased time     Problem Solving: Requires verbal cues;Requires tactile cues General Comments: improving mentation, pleasant and participatory. some memory deficits noted as pt reported no loose stools earlier today though nursing staff had assisted pt twice already for [facility]        Exercises     Shoulder Instructions       General Comments VSS on RA. NT in to assist with cleanup during OT session    Pertinent Vitals/ Pain       Pain Assessment: Faces Faces Pain Scale: Hurts a little bit Pain Location: low back Pain Descriptors / Indicators: Sore Pain Intervention(s): Monitored during session;Repositioned  Home Living                                          Prior Functioning/Environment              Frequency  Min 2X/week        Progress Toward Goals  OT Goals(current goals can now be found in the care plan section)  Progress towards OT goals: Progressing toward goals  Acute Rehab OT Goals Patient Stated Goal: feel better OT Goal Formulation: With patient Time For Goal Achievement: 09/13/20 Potential to Achieve Goals: Good ADL Goals Pt Will Perform Eating: with modified independence;sitting Pt Will Perform Grooming: with modified independence;sitting Pt Will Transfer to Toilet: with supervision;ambulating Pt/caregiver will Perform Home Exercise Program: Increased ROM;Both right and left upper extremity;With  minimal assist;With written HEP provided Additional ADL Goal #1: Pt will perform bed mobility with minA in order to increase independence for OOB ADL. Additional ADL Goal #2: Pt will follow 1-2 step commands with minimal cues to attend to task.  Plan Discharge plan remains appropriate    Co-evaluation                 AM-PAC OT "6 Clicks" Daily Activity     Outcome Measure   Help from another person eating meals?: A Little Help from another person taking care of personal grooming?: A Little Help from another person toileting, which includes using toliet, bedpan, or urinal?: A Lot Help from another person bathing (including washing, rinsing, drying)?: A Lot Help from another person to put on and taking off regular upper body clothing?: A Lot Help from another person to put on and taking off regular lower body clothing?: A Lot 6 Click Score: 14    End of Session Equipment Utilized During Treatment: Other (comment) (Rt platform walker)  OT Visit Diagnosis: Unsteadiness on feet (R26.81);Muscle weakness (generalized) (M62.81);Pain;Other symptoms and signs involving cognitive function Pain - Right/Left: Right Pain - part of body: Hand   Activity Tolerance Patient tolerated treatment well   Patient Left in chair;with call bell/phone within reach;with chair alarm set   Nurse Communication Mobility status;Other (comment) (IV beeping)        Time: [id number] OT Time Calculation (min): 38 min  Charges: OT General Charges $OT Visit: 1 Visit OT Treatments $Self Care/Home Management : 23-37 mins $Therapeutic Activity: 8-22 mins  Malachy Chamber, OTR/L Acute Rehab Services Office: [phone]   Layla Maw 08/30/2020, 3:24 PM

## 2020-08-30 NOTE — Consult Note (Signed)
[facility] Renal Consultation Note  Requesting MD:  Indication for Consultation: Medical management of renal transplant high risk medications.  Chronic kidney disease stage IV.  Assessment of anemia.  Assessment of bone mineral metabolism.  HPI:   Ruth Riley is a 75 y.o. female.  She was admitted 08/16/2020 on transfer from [facility] to [facility].  The initial consultation was for a GI evaluation due to worsening anemia.  She has a history of diabetes, hypertension hyperlipidemia.  She has multiple falls involving injury to both her right/left hand and confusion.  Significant studies at [facility] revealed an ejection fraction of 0000000 grade 2 diastolic dysfunction.  CT scan abdomen pelvis 08/24/2020 that revealed no evidence of perforation information and mild anasarca.  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  Blood cultures 08/16/2020 with positive Candida albicans.  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  She was placed on Eraxis and is to receive lifelong therapy with fluconazole.  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  She developed bleeding complications and IV heparin has been discontinued.  Her initial complaints of blood loss through the GI tract was evaluated with EGD/colonoscopy and she underwent biopsy.  No obvious sign of bleeding could be identified.   She had a renal transplantation performed at [facility] 07/15/2016 secondary to adult polycystic kidney disease.  Prior to that she was on peritoneal dialysis.  Post transplant history includes volume overload 3/23 -26/2018 managed with aggressive diuretic therapy.  She was hospitalized 08/2016 for accelerated hypertension requiring antihypertensive medication.  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   this was managed with IV ganciclovir.  Her immunosuppression including Myfortic and belatacept were held.  She is followed by community nephrologist Dr.  Willene Hatchet .She also has a history of COVID infection with pneumonia 2/16-2/[place].  She also is seen by transplant at [facility].  It appears that Dr. Roxy Horseman is a transplant nephrologist  Her home immunosuppressive medications include tacrolimus 2 mg in the morning and 1 mg at night.  She also takes 5 mg of prednisone every day.   Other in the hospital medications.  Carvedilol 25 mg twice daily, hydralazine 50 mg every 8 hours, insulin 5 units q. bedtime Lantus, sliding scale insulin, Protonix 40 mg twice daily, prednisone 10 mg daily Requip 3 mg nightly Crestor 20 mg daily Zoloft 100 mg daily sodium bicarbonate 1.3 g twice daily Bactrim 400-80 mg Monday Wednesday Friday,  IV Eraxis   Blood pressure 145/66 pulse 78 temperature 99 O2 sats 95% urine output 750 cc 08/29/2020.  Sodium 134 potassium 4.5 chloride 106 CO2 23 BUN 33 creatinine 2.78 glucose 70 calcium 8.0 hemoglobin 7.6         Creatinine  Date/Time Value Ref Range Status  07/20/2020 12:00 AM 2.8 (A) 0.5 - 1.1 Final  06/22/2020 12:00 AM 3.2 (A) 0.5 - 1.1 Final  05/25/2020 12:00 AM 3.3 (A) 0.5 - 1.1 Final   Creatinine, Ser  Date/Time Value Ref Range Status  08/28/2020 02:54 AM 2.78 (H) 0.44 - 1.00 mg/dL Final  08/26/2020 12:47 AM 2.57 (H) 0.44 - 1.00 mg/dL Final  08/25/2020 04:52 AM 2.53 (H) 0.44 - 1.00 mg/dL Final  08/22/2020 12:45 AM 3.01 (H) 0.44 - 1.00 mg/dL Final  08/21/2020 12:18 AM 3.08 (H) 0.44 - 1.00 mg/dL Final  08/20/2020 12:30 AM 3.17 (H) 0.44 - 1.00 mg/dL Final  08/19/2020 01:58 AM 3.44 (H) 0.44 - 1.00 mg/dL Final  08/18/2020 02:32 AM 3.48 (H) 0.44 - 1.00 mg/dL Final  08/17/2020 01:09 AM 3.54 (H) 0.44 - 1.00 mg/dL Final  08/16/2020 03:56 PM 3.77 (H) 0.44 - 1.00 mg/dL Final     PMHx:   Past Medical History:   Diagnosis Date  . Complication of anesthesia    post anestheia nausea  . Diabetes mellitus type 2 in nonobese (HCC)   . Essential hypertension   . H/O kidney transplant   . Stage 4 chronic kidney disease [facility])     Past Surgical History:  Procedure Laterality Date  . BIOPSY  08/23/2020   Procedure: BIOPSY;  Surgeon: Jerene Bears, MD;  Location: [place];  Service: Gastroenterology;;  . Kathleen Argue STUDY  08/24/2020   Procedure: BUBBLE STUDY;  Surgeon: Dixie Dials, MD;  Location: [place];  Service: Cardiovascular;;  . COLONOSCOPY N/A 08/23/2020   Procedure: COLONOSCOPY;  Surgeon: Jerene Bears, MD;  Location: [place];  Service: Gastroenterology;  Laterality: N/A;  . ESOPHAGOGASTRODUODENOSCOPY (EGD) WITH PROPOFOL N/A 08/23/2020   Procedure: ESOPHAGOGASTRODUODENOSCOPY (EGD) WITH PROPOFOL;  Surgeon: Jerene Bears, MD;  Location: [facility] ENDOSCOPY;  Service: Gastroenterology;  Laterality: N/A;  . HEMOSTASIS CLIP PLACEMENT  08/23/2020   Procedure: HEMOSTASIS CLIP PLACEMENT;  Surgeon: Jerene Bears, MD;  Location: [place] ENDOSCOPY;  Service: Gastroenterology;;  . HOT HEMOSTASIS N/A 08/23/2020   Procedure: HOT HEMOSTASIS (ARGON PLASMA COAGULATION/BICAP);  Surgeon: Jerene Bears, MD;  Location: [facility] ENDOSCOPY;  Service: Gastroenterology;  Laterality: N/A;  . IR FLUORO GUIDE CV LINE RIGHT  08/22/2020  . IR US GUIDE VASC ACCESS RIGHT  08/22/2020  . TEE WITHOUT CARDIOVERSION N/A 08/24/2020   Procedure: TRANSESOPHAGEAL ECHOCARDIOGRAM (TEE);  Surgeon: Dixie Dials, MD;  Location: [facility] ENDOSCOPY;  Service: Cardiovascular;  Laterality: N/A;    Family Hx: History reviewed. No pertinent family history.  Social History:  reports that she has never smoked. She has never used smokeless tobacco. No history on file for alcohol use and drug use.  Allergies:  Allergies  Allergen Reactions  . Penicillins Hives and Other (See Comments)  . Tramadol Other (See Comments) and Palpitations    Patient reports it makes  her feel spaced out Patient reports it makes her feel spaced out Patient reports it makes her feel spaced out Patient reports it makes her feel spaced out   . Donepezil Diarrhea and Other (See Comments)  . Amlodipine Swelling    dizziness    Medications: Prior to Admission medications   Medication Sig Start Date End Date Taking? Authorizing Provider  acetaminophen (TYLENOL) 500 MG tablet Take 500 mg by mouth every 6 (six) hours as needed for moderate pain. 08/15/16  Yes [provider]  carvedilol (COREG) 25 MG tablet Take 25 mg by mouth 2 (two) times daily. 04/14/20  Yes [provider]  cyanocobalamin 1000 MCG tablet Take by mouth.   Yes [provider]  HUMALOG KWIKPEN 100 UNIT/ML KwikPen Inject 0-10 Units into the skin 3 (three) times daily as needed (blood sugar). Sliding scale 07/01/20  Yes [provider]  LANTUS SOLOSTAR 100 UNIT/ML Solostar Pen Inject 10 Units into the skin at bedtime. 07/26/20  Yes [provider]  Magnesium-Zinc (MAGNESIUM-CHELATED ZINC) 133.33-5 MG TABS Take 1 tablet by mouth daily.   Yes [provider]  oxyCODONE (OXY IR/ROXICODONE)  5 MG immediate release tablet Take 5 mg by mouth every 4 (four) hours as needed for severe pain.   Yes [provider]  pantoprazole (PROTONIX) 40 MG tablet Take 40 mg by mouth 2 (two) times daily. 05/10/20  Yes [provider]  predniSONE (DELTASONE) 20 MG tablet Take 20 mg by mouth daily with breakfast.   Yes [provider]  rOPINIRole (REQUIP) 3 MG tablet Take 3 mg by mouth at bedtime. 05/03/20  Yes [provider]  rosuvastatin (CRESTOR) 40 MG tablet Take 40 mg by mouth daily. 10/19/19 03/31/21 Yes [provider]  sertraline (ZOLOFT) 100 MG tablet Take 100 mg by mouth daily. 10/19/19  Yes [provider]  sodium bicarbonate 650 MG tablet Take 1,300 mg by mouth 2 (two) times daily. 05/03/20  Yes [provider]   sulfamethoxazole-trimethoprim (BACTRIM) 400-80 MG tablet Take 1 tablet by mouth 3 (three) times a week. 05/03/20  Yes [provider]  tacrolimus (PROGRAF) 1 MG capsule Take 1 mg by mouth 3 (three) times daily. 04/26/20  Yes [provider]  Vitamin D, Ergocalciferol, (DRISDOL) 1.25 MG (50000 UNIT) CAPS capsule Take 50,000 Units by mouth once a week. 05/18/20  Yes [provider]      Labs:  Results for orders placed or performed during the hospital encounter of 08/16/20 (from the past 48 hour(s))  Glucose, capillary     Status: Abnormal   Collection Time: 08/28/20  7:53 AM  Result Value Ref Range   Glucose-Capillary 57 (L) 70 - 99 mg/dL    Comment: Glucose reference range applies only to samples taken after fasting for at least 8 hours.  Glucose, capillary     Status: None   Collection Time: 08/28/20  9:12 AM  Result Value Ref Range   Glucose-Capillary 83 70 - 99 mg/dL    Comment: Glucose reference range applies only to samples taken after fasting for at least 8 hours.  Glucose, capillary     Status: Abnormal   Collection Time: 08/28/20 12:41 PM  Result Value Ref Range   Glucose-Capillary 145 (H) 70 - 99 mg/dL    Comment: Glucose reference range applies only to samples taken after fasting for at least 8 hours.  Glucose, capillary     Status: Abnormal   Collection Time: 08/28/20  4:51 PM  Result Value Ref Range   Glucose-Capillary 119 (H) 70 - 99 mg/dL    Comment: Glucose reference range applies only to samples taken after fasting for at least 8 hours.  CBC     Status: Abnormal   Collection Time: 08/28/20  5:07 PM  Result Value Ref Range   WBC 5.2 4.0 - 10.5 K/uL   RBC 3.02 (L) 3.87 - 5.11 MIL/uL   Hemoglobin 8.4 (L) 12.0 - 15.0 g/dL   HCT 26.9 (L) 36.0 - 46.0 %   MCV 89.1 80.0 - 100.0 fL   MCH 27.8 26.0 - 34.0 pg   MCHC 31.2 30.0 - 36.0 g/dL   RDW 17.2 (H) 11.5 - 15.5 %   Platelets 214 150 - 400 K/uL   nRBC 0.0 0.0 - 0.2 %    Comment: Performed at  [facility] [address], [place], [place] [address]  Glucose, capillary     Status: Abnormal   Collection Time: 08/28/20  8:51 PM  Result Value Ref Range   Glucose-Capillary 154 (H) 70 - 99 mg/dL    Comment: Glucose reference range applies only to samples taken after  fasting for at least 8 hours.  Heparin level (unfractionated)     Status: None   Collection Time: 08/29/20  2:27 AM  Result Value Ref Range   Heparin Unfractionated 0.41 0.30 - 0.70 IU/mL    Comment: (NOTE) The clinical reportable range upper limit is being lowered to >1.10 to align with the FDA approved guidance for the current laboratory assay.  If heparin results are below expected values, and patient dosage has  been confirmed, suggest follow up testing of antithrombin III levels. Performed at [facility], [place] [address], [place], [place] [address]   CBC     Status: Abnormal   Collection Time: 08/29/20  2:27 AM  Result Value Ref Range   WBC 4.2 4.0 - 10.5 K/uL   RBC 2.83 (L) 3.87 - 5.11 MIL/uL   Hemoglobin 7.8 (L) 12.0 - 15.0 g/dL   HCT 25.2 (L) 36.0 - 46.0 %   MCV 89.0 80.0 - 100.0 fL   MCH 27.6 26.0 - 34.0 pg   MCHC 31.0 30.0 - 36.0 g/dL   RDW 17.1 (H) 11.5 - 15.5 %   Platelets 208 150 - 400 K/uL   nRBC 0.0 0.0 - 0.2 %    Comment: Performed at [facility], [place] [address], [place], [place] [address]  Hemoglobin and hematocrit, blood     Status: Abnormal   Collection Time: 08/29/20  4:49 AM  Result Value Ref Range   Hemoglobin 8.4 (L) 12.0 - 15.0 g/dL   HCT 26.7 (L) 36.0 - 46.0 %    Comment: Performed at [facility], [place] [address], [place], [place] [address]  Type and screen [place]     Status: None   Collection Time: 08/29/20  4:49 AM  Result Value Ref Range   ABO/RH(D) O NEG    Antibody Screen NEG    Sample Expiration      09/01/2020,2359 Performed at [facility], [place] [address], [place], [place] [address]   Glucose, capillary     Status:  None   Collection Time: 08/29/20  7:40 AM  Result Value Ref Range   Glucose-Capillary 98 70 - 99 mg/dL    Comment: Glucose reference range applies only to samples taken after fasting for at least 8 hours.  Glucose, capillary     Status: Abnormal   Collection Time: 08/29/20 11:41 AM  Result Value Ref Range   Glucose-Capillary 155 (H) 70 - 99 mg/dL    Comment: Glucose reference range applies only to samples taken after fasting for at least 8 hours.  Glucose, capillary     Status: Abnormal   Collection Time: 08/29/20  4:24 PM  Result Value Ref Range   Glucose-Capillary 287 (H) 70 - 99 mg/dL    Comment: Glucose reference range applies only to samples taken after fasting for at least 8 hours.  Glucose, capillary     Status: Abnormal   Collection Time: 08/29/20  8:23 PM  Result Value Ref Range   Glucose-Capillary 187 (H) 70 - 99 mg/dL    Comment: Glucose reference range applies only to samples taken after fasting for at least 8 hours.     ROS:  General: No fever sweats chills.  Complains of weakness and lethargy Eyes: No blurred vision double vision loss of vision ENT: No hearing loss epistaxis no sore throat no dental sinus complaints Cardiovascular: No shortness of breath angina orthopnea ankle leg swelling palpitations blackouts Respiratory: No cough wheeze  hemoptysis Abdominal: No abdominal pain nausea vomiting no diarrhea Urogenital: No urgency frequency dysuria nocturia hematuria Dermatologic: No skin rash no itching Musculoskeletal: Diffuse joint pains muscle aches.  General debility. 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  Rate and rhythm is regular Lungs: Clear to auscultation without wheezes or rales Abdomen: Soft nontender with no obvious organosplenomegaly Extremities: No cyanosis no clubbing onychogryphosis noted on fingernails.  She had a wrist brace on her right hand Skin: No ecchymosis or excoriations no rash Neuro: Grossly intact moving all 4 limbs.  Assessment/Plan: 1.Chronic kidney disease stage IV.  Patient's renal function appears to be around about baseline.  The does not appear to be a whole lot of change from her renal function.  She is status post renal transplant.  There is no any evidence of obstruction.  She has some dominant polycystic kidney disease with numerous cysts of relatively hypodensity.  No hydronephrosis noted.  Will order urinalysis.  2. Hypertension/volume  -appears euvolemic and hypertensive currently on Coreg, hydralazine with reasonable control.  There are some hypertensive spikes noted.  Increasing the dose of hydralazine or Coreg may be appropriate 3. Anemia  -we will check iron studies.  It may be advisable to avoid IV iron in setting of infection.  Transfusion may be the preferred mode of correction.  She may be a candidate for ESA therapy. 4.  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  There has been an effort to reduce her maintenance immunosuppression.  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  She will need close follow-up with her transplant nephrologist.  Modification of her immunosuppressants may need to be undertaken.  At this present time she is just on low-dose prednisone and low-dose Prograf.   5.  Immunosuppressant medications: With the addition of Diflucan there may be an interaction between her and her Prograf leading to high levels of Prograf.  I would recommend 50% reduction in dose of Prograf once Diflucan is initiated.  I would also check tacrolimus levels and renal function panels twice weekly.  This will need to be carefully communicated  to her primary nephrology team.   Sherril Croon 08/30/2020, 7:37 AM

## 2020-08-30 NOTE — Progress Notes (Addendum)
PROGRESS NOTE        PATIENT DETAILS Name: Ruth Riley Age: 75 y.o. Sex: female Date of Birth: [birth year] Admit Date: 08/16/2020 Admitting Physician Karmen Bongo, MD [id number], [name], MD  Brief Narrative: Patient is a 75 y.o. female with history of renal transplant, CKD stage IV, DM-2, HTN, HLD-presented to the hospital with multiple orthopedic complaints-s/p multiple falls with both right/left hand injury, confusion, worsening anemia-she was subsequently transferred from [facility] to [facility] for GI evaluation.  See below for further details.   Significant events: 4/5>> transferred from [facility] to [facility]  Significant studies: 4/6>> chest x-ray: No active disease. 4/8>> x-ray left hand: No acute fracture or dislocation. 4/8>> MRI right hand/wrist: No fracture/osteomyelitis-severe flexor Tina synovitis, second/third MCP joint erosions, enlarged radiocarpal midcarpal joint infusion-suggestive of inflammatory arthropathy. 4/8>> Echo: EF [id number], grade 2 diastolic dysfunction 0000000 CT abdomen/pelvis: No perforation/focal inflammation in the bowel-autosomal dominant polycystic kidney disease.  Mild anasarca. 4/13>> TEE: Vegetation on MV, TV, AV, linear mobile clot in RA cavity  Antimicrobial therapy: Rocephin: 4/6>> 4/8 Zithromax: 4/6>> 4/10 Cefdinir: 4/9>> 4/10 Eraxis: 04/7>>  Microbiology data: 4/5>> blood culture: Candida albicans 4/7>> blood culture: No growth  Pathology: 4/12>> ascending colon biopsy: No inflammation/dysplasia/carcinoma  Procedures : 4/11>> right IJ tunneled CVC 4/12>> EGD: 2 angiectasia is in the stomach-1 with active oozing-s/p APC. 4/12>> colonoscopy: Edema/adherent blood in the proximal ascending colon/cecum-?  Ischemic colitis.  Diverticulosis in the sigmoid/descending colon. 4/13>> TEE: Vegetation on MV, TV, AV, linear mobile clot in RA cavity  Consults: GI, ID, IR, cards,CTVS,renal  DVT  Prophylaxis : SCDs Start: 08/16/20 1429   Subjective: No further episodes of epistaxis.  Has chronic back pain that flared up last night-currently back pain back to baseline.  Had a large BM earlier this morning with brown stools.  Assessment/Plan: Acute metabolic encephalopathy: Probably related to fungemia/pneumonia-resolved- her mentation is back to baseline.  Multifocal pneumonia-recent history of COVID-19 infection: Overall improved-completed course of antibiotics.  Cultures as above.  Fungemia with endocarditis involving the mitral, tricuspid and aortic valve On Eraxis-repeat blood cultures negative-ID following.  Not a candidate for surgery-per CT views-patient does not want surgery as well.  Difficult situation-as SNF discharge is planned-however SNF unable to cover the cost of Eraxis-discussed with infectious disease-plan is to continue 2-3 more days of Eraxis-then switch to fluconazole.  Given interaction of fluconazole with Prograf-spoke with nephrologist Dr. Webb-recommendations are to decrease Prograf to 1 mg in a.m. and 0.5 mg nightly-and to obtain biweekly Prograf levels and renal function while on fluconazole.  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  Patient was started on IV heparin-however she developed epistaxis last night-discussed with Dr. Doylene Canard again on 4/18-he does not advise any further anticoagulation from here out.    Upper GI bleeding with acute blood loss anemia: EGD/colonoscopy as above-no further obvious GI loss apparent-had brown stools on 4/19.  Continue PPI.    Continue PPI.  Biopsy from colonoscopy was negative for malignancy or inflammation.  Epistaxis: Occurred on 4/18-resolved with supportive care  Anemia: Multifactorial-has anemia related to CKD at baseline-worsening hemoglobin this hospitalization is due to acute blood loss anemia from upper  GI bleeding, epistaxis and from critical illness.  Obtaining iron studies-nephrology recommending Aranesp.  Repeat CBC tomorrow-if hemoglobin still downtrending-May require another transfusion.  AKI on CKD stage IV-renal transplantation: AKI hemodynamically mediated-has trended down-and now close to baseline.   History of renal transplantation: Continue steroids/tacrolimus-on prophylactic Bactrim.  See above regarding plans to reduce dosage of Prograf when fluconazole has been started.  HTN: BP controlled-continue Coreg, hydralazine  HLD: Continue statin  DM-2: CBGs stable-but with a.m. hypoglycemia-decrease Lantus to 5 units-continue SSI.  Follow and adjust.   Recent Labs    08/29/20 1624 08/29/20 2023 08/30/20 0803  GLUCAP 287* 187* 102*   Right wrist pain/swelling: See above regarding MRI imaging-prior MD discussed with hand surgery-Dr. Hilbert Corrigan thought that this was related to inflammatory changes-recommendations were to keep splint for extended periods and steroids.  Right wrist swelling seems to have improved compared to the past few days.  Chronic back pain: Continue supportive care.  Deconditioning/debility: Due to acute illness-SNF on discharge.   Diet: Diet Order            Diet Carb Modified Fluid consistency: Thin; Room service appropriate? Yes  Diet effective now                  Code Status: DNR  Family Communication: Spouse-David Dollens-[phone] on 4/18-none at bedside on 4/19.  We will update family on 4/20.   Disposition Plan:  Status is: Inpatient  Remains inpatient appropriate because:Inpatient level of care appropriate due to severity of illness   Dispo: The patient is from: Home              Anticipated d/c is to: Home              Patient currently is not medically stable to d/c.   Difficult to place patient No   Barriers to Discharge: Fungemia-on Eraxis IV to complete 2 weeks course of intravenous antifungal before transitioning to  Diflucan.  Antimicrobial agents: Anti-infectives (From admission, onward)   Start     Dose/Rate Route Frequency Ordered Stop   08/25/20 1000  anidulafungin (ERAXIS) 200 mg in sodium chloride 0.9 % 200 mL IVPB        200 mg 78 mL/hr over 200 Minutes Intravenous Daily 08/24/20 1445 09/01/20 0959   08/20/20 1000  cefdinir (OMNICEF) capsule 300 mg  Status:  Discontinued        300 mg Oral Daily 08/19/20 0918 08/19/20 0936   08/20/20 1000  cefdinir (OMNICEF) capsule 300 mg        300 mg Oral Daily 08/19/20 0936 08/21/20 0922   08/19/20 1015  azithromycin (ZITHROMAX) tablet 500 mg  Status:  Discontinued        500 mg Oral Daily 08/19/20 0916 08/19/20 0936   08/19/20 1015  azithromycin (ZITHROMAX) tablet 500 mg        500 mg Oral Daily 08/19/20 0936 08/21/20 0921   08/19/20 1000  anidulafungin (ERAXIS) 100 mg in sodium chloride 0.9 % 100 mL IVPB  Status:  Discontinued        100 mg 78 mL/hr over 100 Minutes Intravenous Daily 08/18/20 0247 08/24/20 1445   08/18/20 0400  anidulafungin (ERAXIS) 200 mg in sodium chloride 0.9 % 200 mL IVPB        200 mg 78 mL/hr over 200 Minutes Intravenous  Once 08/18/20 0247 08/18/20 0704   08/17/20 0900  sulfamethoxazole-trimethoprim (BACTRIM) 400-80 MG per tablet 1 tablet        1 tablet Oral Once per day on Mon Wed Fri 08/16/20 1430  08/17/20 0800  cefTRIAXone (ROCEPHIN) 2 g in sodium chloride 0.9 % 100 mL IVPB  Status:  Discontinued        2 g 200 mL/hr over 30 Minutes Intravenous Every 24 hours 08/16/20 1430 08/19/20 0918   08/17/20 0800  azithromycin (ZITHROMAX) 500 mg in sodium chloride 0.9 % 250 mL IVPB  Status:  Discontinued        500 mg 250 mL/hr over 60 Minutes Intravenous Every 24 hours 08/16/20 1430 08/19/20 0916       Time spent: 25 minutes-Greater than 50% of this time was spent in counseling, explanation of diagnosis, planning of further management, and coordination of care.  MEDICATIONS: Scheduled Meds: . carvedilol  25 mg Oral BID   . Chlorhexidine Gluconate Cloth  6 each Topical Daily  . hydrALAZINE  50 mg Oral Q8H  . insulin aspart  0-9 Units Subcutaneous TID WC  . insulin glargine  5 Units Subcutaneous QHS  . lidocaine  1 patch Transdermal Q0600  . pantoprazole  40 mg Oral BID  . polyethylene glycol  17 g Oral BID  . predniSONE  10 mg Oral Q breakfast  . rOPINIRole  3 mg Oral QHS  . rosuvastatin  20 mg Oral Daily  . sertraline  100 mg Oral QHS  . sodium bicarbonate  1,300 mg Oral BID  . sodium zirconium cyclosilicate  10 g Oral BID  . sulfamethoxazole-trimethoprim  1 tablet Oral Once per day on Mon Wed Fri  . tacrolimus  2 mg Oral Daily   And  . tacrolimus  1 mg Oral QHS   Continuous Infusions: . sodium chloride 10 mL/hr at 08/26/20 1842  . anidulafungin 200 mg (08/30/20 0828)   PRN Meds:.acetaminophen **OR** acetaminophen, albuterol, calcium carbonate (dosed in mg elemental calcium), camphor-menthol **AND** hydrOXYzine, docusate sodium, feeding supplement (NEPRO CARB STEADY), guaiFENesin, hydrALAZINE, HYDROcodone-acetaminophen, lidocaine, lip balm, ondansetron **OR** ondansetron (ZOFRAN) IV, oxyCODONE, oxymetazoline, sodium chloride, sorbitol, zolpidem   PHYSICAL EXAM: Vital signs: Vitals:   08/30/20 0137 08/30/20 0348 08/30/20 0416 08/30/20 0758  BP: (!) 187/97 (!) 186/100 (!) 162/91 (!) 143/87  Pulse:   75 75  Resp:   18 20  Temp:   98 F (36.7 C) 97.6 F (36.4 C)  TempSrc:   Axillary Oral  SpO2:   94% 93%  Weight:      Height:       Filed Weights   08/20/20 0339 08/21/20 0347 08/24/20 1205  Weight: 57 kg 58 kg 58 kg   Body mass index is 24.16 kg/m.   Gen Exam:Alert awake-not in any distress HEENT:atraumatic, normocephalic Chest: B/L clear to auscultation anteriorly CVS:S1S2 regular Abdomen:soft non tender, non distended Extremities:no edema Neurology: Non focal Skin: no rash  I have personally reviewed following labs and imaging studies  LABORATORY DATA: CBC: Recent Labs  Lab  08/27/20 0021 08/28/20 0254 08/28/20 1707 08/29/20 0227 08/29/20 0449 08/30/20 0808  WBC 6.0 5.5 5.2 4.2  --  4.4  HGB 8.1* 7.6* 8.4* 7.8* 8.4* 7.7*  HCT 26.3* 24.3* 26.9* 25.2* 26.7* 25.1*  MCV 89.5 89.0 89.1 89.0  --  89.3  PLT 211 199 214 208  --  [id number]    Basic Metabolic Panel: Recent Labs  Lab 08/25/20 0452 08/26/20 0047 08/28/20 0254 08/30/20 0808  NA 138 133* 134* 134*  K 3.6 4.2 4.5 5.2*  CL 107 108 106 106  CO2 23 18* 23 22  GLUCOSE 79 119* 70 106*  BUN 34* 36* 33* 29*  CREATININE  2.53* 2.57* 2.78* 2.85*  CALCIUM 8.1* 7.8* 8.0* 8.6*    GFR: Estimated Creatinine Clearance: 14.4 mL/min (A) (by C-G formula based on SCr of 2.85 mg/dL (H)).  Liver Function Tests: No results for input(s): AST, ALT, ALKPHOS, BILITOT, PROT, ALBUMIN in the last 168 hours. No results for input(s): LIPASE, AMYLASE in the last 168 hours. No results for input(s): AMMONIA in the last 168 hours.  Coagulation Profile: No results for input(s): INR, PROTIME in the last 168 hours.  Cardiac Enzymes: No results for input(s): CKTOTAL, CKMB, CKMBINDEX, TROPONINI in the last 168 hours.  BNP (last 3 results) No results for input(s): PROBNP in the last 8760 hours.  Lipid Profile: No results for input(s): CHOL, HDL, LDLCALC, TRIG, CHOLHDL, LDLDIRECT in the last 72 hours.  Thyroid Function Tests: No results for input(s): TSH, T4TOTAL, FREET4, T3FREE, THYROIDAB in the last 72 hours.  Anemia Panel: No results for input(s): VITAMINB12, FOLATE, FERRITIN, TIBC, IRON, RETICCTPCT in the last 72 hours.  Urine analysis: No results found for: COLORURINE, APPEARANCEUR, LABSPEC, PHURINE, GLUCOSEU, HGBUR, BILIRUBINUR, KETONESUR, PROTEINUR, UROBILINOGEN, NITRITE, LEUKOCYTESUR  Sepsis Labs: Lactic Acid, Venous No results found for: LATICACIDVEN  MICROBIOLOGY: No results found for this or any previous visit (from the past 240 hour(s)).  RADIOLOGY STUDIES/RESULTS: No results found.   LOS: 13 days    Oren Binet, MD  [facility]    To contact the attending provider between 7A-7P or the covering provider during after hours 7P-7A, please log into the web site [email or web address] and access using universal [place] password for that web site. If you do not have the password, please call the hospital operator.  08/30/2020, 11:44 AM

## 2020-08-30 NOTE — Progress Notes (Signed)
[place] for Infectious Disease    Date of Admission:  08/16/2020   Total days of antibiotics 13 anidulafungin           ID: Ruth Riley is a 75 y.o. 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 Principal Problem:   Fungal endocarditis Active Problems:   Anemia in chronic kidney disease   Acute metabolic encephalopathy   Diabetes mellitus type 2 in nonobese [facility])   Essential hypertension   H/O kidney transplant   Stage 4 chronic kidney disease (HCC)   Bilateral wrist pain   Multifocal pneumonia   History of COVID-19   Chronic back pain   Blood in the stool   Heme positive stool   Acute on chronic anemia   Inflammation of colonic mucosa   Angiodysplasia of stomach with hemorrhage   Left hand fracture   Gastrointestinal hemorrhage associated with duodenitis   Mural thrombus of cardiac apex   Goals of care, counseling/discussion    Subjective: Afebrile, feeling slightly better. No epistaxis  Medications:  . carvedilol  25 mg Oral BID  . Chlorhexidine Gluconate Cloth  6 each Topical Daily  . enoxaparin (LOVENOX) injection  30 mg Subcutaneous Q24H  . hydrALAZINE  50 mg Oral Q8H  . insulin aspart  0-9 Units Subcutaneous TID WC  . insulin glargine  5 Units Subcutaneous QHS  . lidocaine  1 patch Transdermal Q0600  . pantoprazole  40 mg Oral BID  . polyethylene glycol  17 g Oral BID  . predniSONE  10 mg Oral Q breakfast  . rOPINIRole  3 mg Oral QHS  . rosuvastatin  20 mg Oral Daily  . sertraline  100 mg Oral QHS  . sodium bicarbonate  1,300 mg Oral BID  . sodium zirconium cyclosilicate  10 g Oral BID  . sulfamethoxazole-trimethoprim  1 tablet Oral Once per day on Mon Wed Fri  . tacrolimus  2 mg Oral Daily   And  . tacrolimus  1 mg Oral QHS    Objective: Vital signs in last 24 hours: Temp:  [97 F (36.1 C)-99 F (37.2 C)] 97 F (36.1 C) (04/19 1200) Pulse Rate:  [68-76] 72 (04/19 1200) Resp:  [17-20] 17  (04/19 1200) BP: (143-187)/(80-100) 146/88 (04/19 1200) SpO2:  [90 %-95 %] 95 % (04/19 1200) Physical Exam  Constitutional:  oriented to person, place, and time. appears well-developed and well-nourished. No distress.  HENT: [place]/AT, PERRLA, no scleral icterus Mouth/Throat: Oropharynx is clear and moist. No oropharyngeal exudate.  Cardiovascular: Normal rate, regular rhythm and normal heart sounds. Exam reveals no gallop and no friction rub.  No murmur heard.  Pulmonary/Chest: Effort normal and breath sounds normal. No respiratory distress.  has no wheezes.  Neck = supple, no nuchal rigidity Abdominal: Soft. Bowel sounds are normal.  exhibits no distension. There is no tenderness.  Lymphadenopathy: no cervical adenopathy. No axillary adenopathy Neurological: alert and oriented to person, place, and time.  Skin: Skin is warm and dry. No rash noted. No erythema.  Psychiatric: a normal mood and affect.  behavior is normal.    Lab Results Recent Labs    08/28/20 0254 08/28/20 1707 08/29/20 0227 08/29/20 0449 08/30/20 0808  WBC 5.5   < > 4.2  --  4.4  HGB 7.6*   < > 7.8* 8.4* 7.7*  HCT 24.3*   < > 25.2* 26.7* 25.1*  NA 134*  --   --   --  134*  K 4.5  --   --   --  5.2*  CL 106  --   --   --  106  CO2 23  --   --   --  22  BUN 33*  --   --   --  29*  CREATININE 2.78*  --   --   --  2.85*   < > = values in this interval not displayed.    Microbiology: 4/7 blood cx NGTD 4/5 blood cx c.albicans Studies/Results: No results found.   Assessment/Plan: C.albicans endocarditis involving 3 valves = continue with anidulafungin today and tomorrow, and will transition to oral fluconazole on Thursday with the plan to 1/2 dose of tacrolimus to minimize drug interaction. Will plan for extended course of treatment, potentially life long given unable to do surgical intervention for fungal endocarditis. May be worth repeat TEE in 3 months  Mural thrombus = attempted to anticoagulate but had  epistaxis thus heparin discontinued  Deconditioning = recommend to start working with PT/OT  [facility] for Infectious Diseases Cell: [phone] Pager: [phone]  08/30/2020, 2:28 PM

## 2020-08-30 NOTE — Progress Notes (Signed)
Pt complains of 9/10 back pain post oxy at 0048.  Pt has been repositioned X2, pillows for support.  Heat packs added with no relief.  MD Rathore paged.

## 2020-08-31 LAB — BASIC METABOLIC PANEL
Anion gap: 7 (ref 5–15)
BUN: 30 mg/dL — ABNORMAL HIGH (ref 8–23)
CO2: 22 mmol/L (ref 22–32)
Calcium: 8.4 mg/dL — ABNORMAL LOW (ref 8.9–10.3)
Chloride: 104 mmol/L (ref 98–111)
Creatinine, Ser: 2.99 mg/dL — ABNORMAL HIGH (ref 0.44–1.00)
GFR, Estimated: 16 mL/min — ABNORMAL LOW (ref >60)
Glucose, Bld: 68 mg/dL — ABNORMAL LOW (ref 70–99)
Potassium: 5 mmol/L (ref 3.5–5.1)
Sodium: 133 mmol/L — ABNORMAL LOW (ref 135–145)

## 2020-08-31 LAB — CBC
HCT: 26.8 % — ABNORMAL LOW (ref 36.0–46.0)
Hemoglobin: 8.2 g/dL — ABNORMAL LOW (ref 12.0–15.0)
MCH: 27.7 pg (ref 26.0–34.0)
MCHC: 30.6 g/dL (ref 30.0–36.0)
MCV: 90.5 fL (ref 80.0–100.0)
Platelets: 259 10*3/uL (ref 150–400)
RBC: 2.96 MIL/uL — ABNORMAL LOW (ref 3.87–5.11)
RDW: 17.1 % — ABNORMAL HIGH (ref 11.5–15.5)
WBC: 5.1 10*3/uL (ref 4.0–10.5)
nRBC: 0 % (ref 0.0–0.2)

## 2020-08-31 LAB — IRON AND TIBC
Iron: 25 ug/dL — ABNORMAL LOW (ref 28–170)
Saturation Ratios: 16 % (ref 10.4–31.8)
TIBC: 160 ug/dL — ABNORMAL LOW (ref 250–450)
UIBC: 135 ug/dL

## 2020-08-31 LAB — FERRITIN: Ferritin: 254 ng/mL (ref 11–307)

## 2020-08-31 LAB — RETICULOCYTES
Immature Retic Fract: 17.5 % — ABNORMAL HIGH (ref 2.3–15.9)
RBC.: 3 MIL/uL — ABNORMAL LOW (ref 3.87–5.11)
Retic Count, Absolute: 49.8 10*3/uL (ref 19.0–186.0)
Retic Ct Pct: 1.7 % (ref 0.4–3.1)

## 2020-08-31 LAB — GLUCOSE, CAPILLARY
Glucose-Capillary: 81 mg/dL (ref 70–99)
Glucose-Capillary: 129 mg/dL — ABNORMAL HIGH (ref 70–99)
Glucose-Capillary: 163 mg/dL — ABNORMAL HIGH (ref 70–99)
Glucose-Capillary: 150 mg/dL — ABNORMAL HIGH (ref 70–99)

## 2020-08-31 MED ORDER — TACROLIMUS 1 MG PO CAPS
1.0000 mg | ORAL_CAPSULE | Freq: Every day | ORAL | Status: DC
  Administered 2020-08-31: 1 mg via ORAL
  Filled 2020-08-31: qty 1

## 2020-08-31 MED ORDER — TACROLIMUS 1 MG PO CAPS: 1.0000 mg | ORAL_CAPSULE | Freq: Every day | ORAL | Status: DC

## 2020-08-31 MED ORDER — FLUCONAZOLE 100 MG PO TABS
400.0000 mg | ORAL_TABLET | Freq: Every day | ORAL | Status: DC
  Administered 2020-09-01: 400 mg via ORAL
  Filled 2020-08-31: qty 4

## 2020-08-31 MED ORDER — DARBEPOETIN ALFA 40 MCG/0.4ML IJ SOSY
40.0000 ug | PREFILLED_SYRINGE | Freq: Once | INTRAMUSCULAR | Status: AC
  Administered 2020-08-31: 40 ug via SUBCUTANEOUS
  Filled 2020-08-31: qty 0.4

## 2020-08-31 NOTE — Progress Notes (Signed)
[place] for Infectious Disease    Date of Admission:  08/16/2020   Total days of antibiotics 14/anidulafungin          ID: Ruth Riley is a 75 y.o. female with renal txp admitted for gi bleed and found to have  C.albicans native 3-valve endocarditis and mural thrombus Principal Problem:   Fungal endocarditis Active Problems:   Anemia in chronic kidney disease   Acute metabolic encephalopathy   Diabetes mellitus type 2 in nonobese [facility])   Essential hypertension   H/O kidney transplant   Stage 4 chronic kidney disease (HCC)   Bilateral wrist pain   Multifocal pneumonia   History of COVID-19   Chronic back pain   Blood in the stool   Heme positive stool   Acute on chronic anemia   Inflammation of colonic mucosa   Angiodysplasia of stomach with hemorrhage   Left hand fracture   Gastrointestinal hemorrhage associated with duodenitis   Mural thrombus of cardiac apex   Goals of care, counseling/discussion    Subjective: Slept. Denies fever, chills, and no longer having loose stools (previously received miralax)  Medications:  . carvedilol  25 mg Oral BID  . Chlorhexidine Gluconate Cloth  6 each Topical Daily  . enoxaparin (LOVENOX) injection  30 mg Subcutaneous Q24H  . [START ON 09/01/2020] fluconazole  400 mg Oral Daily  . hydrALAZINE  50 mg Oral Q8H  . insulin aspart  0-9 Units Subcutaneous TID WC  . insulin glargine  5 Units Subcutaneous QHS  . lidocaine  1 patch Transdermal Q0600  . pantoprazole  40 mg Oral BID  . polyethylene glycol  17 g Oral BID  . predniSONE  10 mg Oral Q breakfast  . rOPINIRole  3 mg Oral QHS  . rosuvastatin  20 mg Oral Daily  . sertraline  100 mg Oral QHS  . sodium bicarbonate  1,300 mg Oral BID  . sodium chloride flush  10-40 mL Intracatheter Q12H  . sulfamethoxazole-trimethoprim  1 tablet Oral Once per day on Mon Wed Fri  . [START ON 09/01/2020] tacrolimus  1 mg Oral Daily   And  . tacrolimus  1 mg Oral QHS     Objective: Vital signs in last 24 hours: Temp:  [97.4 F (36.3 C)-98.2 F (36.8 C)] 98.1 F (36.7 C) (04/20 1208) Pulse Rate:  [70-80] 79 (04/20 1208) Resp:  [14-18] 16 (04/20 1208) BP: (105-165)/(67-99) 115/73 (04/20 1400) SpO2:  [91 %-100 %] 94 % (04/20 1208) Physical Exam  Constitutional:  oriented to person, place, and time. appears well-developed and well-nourished. No distress.  HENT: [place]/AT, PERRLA, no scleral icterus Mouth/Throat: Oropharynx is clear and moist. No oropharyngeal exudate.  Cardiovascular: Normal rate, regular rhythm and normal heart sounds. Exam reveals no gallop and no friction rub.  No murmur heard.  Pulmonary/Chest: Effort normal and breath sounds normal. No respiratory distress.  has no wheezes.  Neck = supple, no nuchal rigidity Abdominal: Soft. Bowel sounds are normal.  exhibits no distension. There is no tenderness.  Ext: right arm wrapped Skin: Skin is warm and dry. No rash noted. No erythema.  Psychiatric: a normal mood and affect.  behavior is normal.    Lab Results Recent Labs    08/30/20 0808 08/31/20 0235  WBC 4.4 5.1  HGB 7.7* 8.2*  HCT 25.1* 26.8*  NA 134* 133*  K 5.2* 5.0  CL 106 104  CO2 22 22  BUN 29* 30*  CREATININE 2.85* 2.99*  Microbiology: 4/7 blood cx ngtd 4/5 blood cx c.albicans Studies/Results: No results found.   Assessment/Plan: C.albicans native 3-valve (MV, TV, AV) endocarditis w/ RA thrombus = will finish out the 2 wk course of anidulafungin today and switch to fluconazole and decrease dose of tacrolimus to minimize drug interaction  RA thrombus = attempted to anticoagulate but had epistaxis; thus, had  Esrd, stage IV with renal transplant = will reduce tacro dose by 1/2 and check tacro level end of the week. Appears at baseline  Deconditioning = discuss need for follow up at SNF  [facility] for Infectious Diseases Cell: [phone] Pager: [phone]  08/31/2020, 2:05  PM

## 2020-08-31 NOTE — TOC Progression Note (Signed)
Transition of Care [facility]) - Progression Note    Patient Details  Name: Ruth Riley MRN: [id number] Date of Birth: 1945/09/18  Transition of [facility]) CM/SW [place], LCSW Phone Number: 08/31/2020, 12:58 PM  Clinical Narrative:    CSW received new insurance authorization for patient to discharge to [facility] when ready: Ref # [id number], Auth ID# [id number], effective 08/31/20-09/02/20. Updates due to Care Coordinator [facility].    Expected Discharge Plan: [place] Barriers to Discharge: Continued Medical Work up  Expected Discharge Plan and Services Expected Discharge Plan: [place] In-house Referral: Clinical Social Work Discharge Planning Services: CM Consult Post Acute Care Choice: [place] Living arrangements for the past 2 months: Apartment                                       Social Determinants of Health (SDOH) Interventions    Readmission Risk Interventions No flowsheet data found.

## 2020-08-31 NOTE — Progress Notes (Signed)
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  She has a history of renal transplant very complicated course [id number] secondary to polycystic kidney disease.  We were consulted for help with management of her chronic kidney disease as well as management of her immunosuppressive medications.  I discussed the case with Dr. Willene Hatchet with [facility] nephrology.  He knows the patient well.  There have been ongoing discussions about the need to transition to hemodialysis at some point.  In the setting of fungemia, it may be prudent to wait at least 6 to 8 weeks before placing any access.  Tacrolimus dose will need to be adjusted to maintain a trough of 3-5.  Blood pressure 162/94 pulse 75 temperature 97.8 urine output 750 cc 08/30/2020  Sodium 133 potassium 5 chloride 104 CO2 22 BUN 30 creatinine 2.99 glucose 68 calcium 8.4 iron saturation 16%.  Hemoglobin 8.2    Objective:  Vital signs in last 24 hours:  Temp:  [97 F (36.1 C)-98.2 F (36.8 C)] 97.8 F (36.6 C) (04/20 0347) Pulse Rate:  [70-80] 74 (04/20 0747) Resp:  [14-18] 15 (04/20 0747) BP: (122-165)/(76-99) 162/94 (04/20 0747) SpO2:  [91 %-100 %] 96 % (04/20 0747)  Weight change:  Filed Weights   08/20/20 0339 08/21/20 0347 08/24/20 1205  Weight: 57 kg 58 kg 58 kg    Intake/Output: I/O last 3 completed shifts: In: 600 [P.O.:600] Out: 750 [Urine:750]   Intake/Output this shift:  No intake/output data recorded.  General: Sleepy somnolent easy to arouse appropriate HEENT: Pupils round equal reactive oropharynx moist mucous membranes normal Eyes: Pupils are round equal and reactive no icterus however she did have some conjunctival pallor Neck: Supple no thyromegaly no adenopathy midline trachea JVP not elevated Heart: Soft heart sounds with faint murmur throughout systole.  Rate and rhythm is regular Lungs: Clear to  auscultation without wheezes or rales Abdomen: Soft nontender with no obvious organosplenomegaly Extremities: No cyanosis no clubbing onychogryphosis noted on fingernails.  She had a wrist brace on her right hand Skin: No ecchymosis or excoriations no rash Neuro: Grossly intact moving all 4 limbs.   Basic Metabolic Panel: Recent Labs  Lab 08/25/20 0452 08/26/20 0047 08/28/20 0254 08/30/20 0808 08/31/20 0235  NA 138 133* 134* 134* 133*  K 3.6 4.2 4.5 5.2* 5.0  CL 107 108 106 106 104  CO2 23 18* '23 22 22  '$ GLUCOSE 79 119* 70 106* 68*  BUN 34* 36* 33* 29* 30*  CREATININE 2.53* 2.57* 2.78* 2.85* 2.99*  CALCIUM 8.1* 7.8* 8.0* 8.6* 8.4*    Liver Function Tests: No results for input(s): AST, ALT, ALKPHOS, BILITOT, PROT, ALBUMIN in the last 168 hours. No results for input(s): LIPASE, AMYLASE in the last 168 hours. No results for input(s): AMMONIA in the last 168 hours.  CBC: Recent Labs  Lab 08/28/20 0254 08/28/20 1707 08/29/20 0227 08/29/20 0449 08/30/20 0808 08/31/20 0235  WBC 5.5 5.2 4.2  --  4.4 5.1  HGB 7.6* 8.4* 7.8* 8.4* 7.7* 8.2*  HCT 24.3* 26.9* 25.2* 26.7* 25.1* 26.8*  MCV 89.0 89.1 89.0  --  89.3 90.5  PLT 199 214 208  --  221 259    Cardiac Enzymes: No results for input(s): CKTOTAL, CKMB, CKMBINDEX, TROPONINI in the last 168 hours.  BNP: Invalid input(s): POCBNP  CBG: Recent Labs  Lab 08/30/20 0803 08/30/20 1156 08/30/20 1651 08/30/20 2053 08/31/20 0745  GLUCAP 102*  181* 139* 165* 23    Microbiology: Results for orders placed or performed during the hospital encounter of 08/16/20  Culture, blood (routine x 2) Call MD if unable to obtain prior to antibiotics being given     Status: Abnormal (Preliminary result)   Collection Time: 08/16/20  3:50 PM   Specimen: BLOOD  Result Value Ref Range Status   Specimen Description BLOOD RIGHT ANTECUBITAL  Final   Special Requests   Final    BOTTLES DRAWN AEROBIC AND ANAEROBIC Blood Culture results may not  be optimal due to an inadequate volume of blood received in culture bottles   Culture  Setup Time (A)  Final    YEAST AEROBIC BOTTLE ONLY CRITICAL RESULT CALLED TO, READ BACK BY AND VERIFIED WITH: PHARMD ABBOTT [id number] FCP    Culture (A)  Final    CANDIDA ALBICANS Sent to [place] for further susceptibility testing. Performed at [facility], [place] [address] [place], [place] [address]    Report Status PENDING  Incomplete  Blood Culture ID Panel (Reflexed)     Status: Abnormal   Collection Time: 08/16/20  3:50 PM  Result Value Ref Range Status   Enterococcus faecalis NOT DETECTED NOT DETECTED Final   Enterococcus Faecium NOT DETECTED NOT DETECTED Final   Listeria monocytogenes NOT DETECTED NOT DETECTED Final   Staphylococcus species NOT DETECTED NOT DETECTED Final   Staphylococcus aureus (BCID) NOT DETECTED NOT DETECTED Final   Staphylococcus epidermidis NOT DETECTED NOT DETECTED Final   Staphylococcus lugdunensis NOT DETECTED NOT DETECTED Final   Streptococcus species NOT DETECTED NOT DETECTED Final   Streptococcus agalactiae NOT DETECTED NOT DETECTED Final   Streptococcus pneumoniae NOT DETECTED NOT DETECTED Final   Streptococcus pyogenes NOT DETECTED NOT DETECTED Final   A.calcoaceticus-baumannii NOT DETECTED NOT DETECTED Final   Bacteroides fragilis NOT DETECTED NOT DETECTED Final   Enterobacterales NOT DETECTED NOT DETECTED Final   Enterobacter cloacae complex NOT DETECTED NOT DETECTED Final   Escherichia coli NOT DETECTED NOT DETECTED Final   Klebsiella aerogenes NOT DETECTED NOT DETECTED Final   Klebsiella oxytoca NOT DETECTED NOT DETECTED Final   Klebsiella pneumoniae NOT DETECTED NOT DETECTED Final   Proteus species NOT DETECTED NOT DETECTED Final   Salmonella species NOT DETECTED NOT DETECTED Final   Serratia marcescens NOT DETECTED NOT DETECTED Final   Haemophilus influenzae NOT DETECTED NOT DETECTED Final   Neisseria meningitidis NOT DETECTED NOT DETECTED  Final   Pseudomonas aeruginosa NOT DETECTED NOT DETECTED Final   Stenotrophomonas maltophilia NOT DETECTED NOT DETECTED Final   Candida albicans DETECTED (A) NOT DETECTED Final    Comment: CRITICAL RESULT CALLED TO, READ BACK BY AND VERIFIED WITH: PHARMD ABBOTT [id number] FCP    Candida auris NOT DETECTED NOT DETECTED Final   Candida glabrata NOT DETECTED NOT DETECTED Final   Candida krusei NOT DETECTED NOT DETECTED Final   Candida parapsilosis NOT DETECTED NOT DETECTED Final   Candida tropicalis NOT DETECTED NOT DETECTED Final   Cryptococcus neoformans/gattii NOT DETECTED NOT DETECTED Final    Comment: Performed at [facility], [address], [place], [place] [address]  Antifungal AST 9 Drug Panel     Status: None   Collection Time: 08/16/20  3:50 PM  Result Value Ref Range Status   Organism ID, Yeast Candida albicans  Corrected    Comment: (NOTE) Identification performed by account, not confirmed by this laboratory. CORRECTED ON 04/14 AT 1238: PREVIOUSLY REPORTED AS Preliminary report  Amphotericin B MIC 1.0 ug/mL  Final    Comment: (NOTE) *Breakpoints have been established for only some organism-drug combinations as indicated. Results of this test are labeled for research purposes only by the assay's manufacturer. The performance characteristics of this assay have not been established by the manufacturer. The result should not be used for treatment or for diagnostic purposes without confirmation of the diagnosis by another medically established diagnostic product or procedure. The performance characteristics were determined by [facility].    Please Note: PENDING  Incomplete   Anidulafungin MIC Comment  Final    Comment: (NOTE) 0.03 ug/mL Susceptible *Breakpoints have been established for only some organism-drug combinations as indicated. Results of this test are labeled for research purposes only by the assay's manufacturer. The performance characteristics of this  assay have not been established by the manufacturer. The result should not be used for treatment or for diagnostic purposes without confirmation of the diagnosis by another medically established diagnostic product or procedure. The performance characteristics were determined by [facility].    Caspofungin MIC Comment  Final    Comment: 0.06 ug/mL Susceptible   Micafungin MIC Comment  Final    Comment: 0.016 ug/mL Susceptible   Posaconazole MIC 0.03 ug/mL  Final    Comment: (NOTE) *Breakpoints have been established for only some organism-drug combinations as indicated. Results of this test are labeled for research purposes only by the assay's manufacturer. The performance characteristics of this assay have not been established by the manufacturer. The result should not be used for treatment or for diagnostic purposes without confirmation of the diagnosis by another medically established diagnostic product or procedure. The performance characteristics were determined by [facility].    Please Note: PENDING  Incomplete   Fluconazole Islt MIC Comment  Final    Comment: 0.25 ug/mL Susceptible   Flucytosine MIC 1.0 ug/mL  Final   Itraconazole MIC 0.06 ug/mL  Final   Voriconazole MIC Comment  Final    Comment: (NOTE) 0.008 ug/mL or less, Susceptible Performed At: [facility] [address] [place], [place] [id number] Rush Farmer MD [id number]    Source CANDIDA ALBICANS/ BLOOD  Final    Comment: Performed at [facility], [place] [address], [place], [place] [address]  Culture, blood (routine x 2) Call MD if unable to obtain prior to antibiotics being given     Status: Abnormal   Collection Time: 08/16/20  3:52 PM   Specimen: BLOOD LEFT ARM  Result Value Ref Range Status   Specimen Description BLOOD LEFT ARM  Final   Special Requests   Final    BOTTLES DRAWN AEROBIC AND ANAEROBIC Blood Culture results may not be optimal due to an inadequate volume of blood received in  culture bottles   Culture  Setup Time (A)  Final    YEAST AEROBIC BOTTLE ONLY CRITICAL VALUE NOTED.  VALUE IS CONSISTENT WITH PREVIOUSLY REPORTED AND CALLED VALUE. Performed at [facility], [place] [address], [place], [place] [address]    Culture CANDIDA ALBICANS (A)  Final   Report Status 08/21/2020 FINAL  Final  Culture, blood (routine x 2)     Status: None   Collection Time: 08/18/20  7:51 AM   Specimen: BLOOD  Result Value Ref Range Status   Specimen Description BLOOD RIGHT ANTECUBITAL  Final   Special Requests   Final    BOTTLES DRAWN AEROBIC AND ANAEROBIC Blood Culture adequate volume   Culture   Final    NO GROWTH 5  DAYS Performed at [facility], [place] [address], [place], [place] [address]    Report Status 08/23/2020 FINAL  Final  Culture, blood (routine x 2)     Status: None   Collection Time: 08/18/20  8:03 AM   Specimen: BLOOD LEFT WRIST  Result Value Ref Range Status   Specimen Description BLOOD LEFT WRIST  Final   Special Requests   Final    BOTTLES DRAWN AEROBIC AND ANAEROBIC Blood Culture adequate volume   Culture   Final    NO GROWTH 5 DAYS Performed at [facility], [place] [address], [place],  [address]    Report Status 08/23/2020 FINAL  Final    Coagulation Studies: No results for input(s): LABPROT, INR in the last 72 hours.  Urinalysis: No results for input(s): COLORURINE, LABSPEC, PHURINE, GLUCOSEU, HGBUR, BILIRUBINUR, KETONESUR, PROTEINUR, UROBILINOGEN, NITRITE, LEUKOCYTESUR in the last 72 hours.  Invalid input(s): APPERANCEUR    Imaging: No results found.   Medications:   . sodium chloride 10 mL/hr at 08/26/20 1842  . anidulafungin 200 mg (08/31/20 0913)   . carvedilol  25 mg Oral BID  . Chlorhexidine Gluconate Cloth  6 each Topical Daily  . enoxaparin (LOVENOX) injection  30 mg Subcutaneous Q24H  . hydrALAZINE  50 mg Oral Q8H  . insulin aspart  0-9 Units Subcutaneous TID WC  . insulin glargine  5 Units Subcutaneous  QHS  . lidocaine  1 patch Transdermal Q0600  . pantoprazole  40 mg Oral BID  . polyethylene glycol  17 g Oral BID  . predniSONE  10 mg Oral Q breakfast  . rOPINIRole  3 mg Oral QHS  . rosuvastatin  20 mg Oral Daily  . sertraline  100 mg Oral QHS  . sodium bicarbonate  1,300 mg Oral BID  . sodium chloride flush  10-40 mL Intracatheter Q12H  . sulfamethoxazole-trimethoprim  1 tablet Oral Once per day on Mon Wed Fri  . tacrolimus  2 mg Oral Daily   And  . tacrolimus  1 mg Oral QHS   acetaminophen **OR** acetaminophen, albuterol, calcium carbonate (dosed in mg elemental calcium), camphor-menthol **AND** hydrOXYzine, docusate sodium, feeding supplement (NEPRO CARB STEADY), guaiFENesin, hydrALAZINE, HYDROcodone-acetaminophen, lidocaine, lip balm, ondansetron **OR** ondansetron (ZOFRAN) IV, oxyCODONE, oxymetazoline, sodium chloride, sodium chloride flush, sorbitol, zolpidem  Assessment/ Plan:  1.Chronic kidney disease stage IV.  Patient's renal function appears to be around about baseline.  The does not appear to be a whole lot of change from her renal function.  She is status post renal transplant.  There is no any evidence of obstruction.  She has some dominant polycystic kidney disease with numerous cysts of relatively hypodensity.  No hydronephrosis noted.  Will order urinalysis.  2. Hypertension/volume  -appears euvolemic and hypertensive currently on Coreg, hydralazine with reasonable control.  There are some hypertensive spikes noted.  Increasing the dose of hydralazine or Coreg may be appropriate 3. Anemia  -slightly iron deficient.  It may be advisable to avoid IV iron in setting of infection.  Transfusion may be the preferred mode of correction.  ESA has been added by primary service. 4.  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  There has been an effort to reduce her maintenance immunosuppression.  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  She will need close follow-up with her transplant nephrologist.  Modification of her immunosuppressants may need to be undertaken.  At this present  time she is just on low-dose prednisone and low-dose Prograf.  This has been discussed with her primary nephrologist Dr.Adegoroye 5.  Immunosuppressant medications: With the addition of Diflucan there may be an interaction between her and her Prograf leading to high levels of Prograf.  I would recommend 50% reduction in dose of Prograf once Diflucan is initiated.  I would also check tacrolimus levels and renal function panels twice weekly.  This will need to be carefully communicated  to her primary nephrology team.    LOS: [place] '@TODAY''@9'$ :30 AM   Sherril Croon 08/31/2020, 9:30 AM

## 2020-08-31 NOTE — Progress Notes (Signed)
Ok to give Aranesp 57mg SQ x1 per Dr. GSloan Leiter   MOnnie Boer PharmD, BCIDP, AAHIVP, CPP Infectious Disease Pharmacist 08/31/2020 12:40 PM

## 2020-08-31 NOTE — Progress Notes (Signed)
PROGRESS NOTE        PATIENT DETAILS Name: Ruth Riley Age: 75 y.o. Sex: female Date of Birth: 15-Oct-1945 Admit Date: 08/16/2020 Admitting Physician Karmen Bongo, MD [id number], [name], MD  Brief Narrative: Patient is a 75 y.o. female with history of renal transplant, CKD stage IV, DM-2, HTN, HLD-presented to the hospital with multiple orthopedic complaints-s/p multiple falls with both right/left hand injury, confusion, worsening anemia-she was subsequently transferred from [facility] to [facility] for GI evaluation.  See below for further details.   Significant events: 4/5>> transferred from [facility] to [facility]  Significant studies: 4/6>> chest x-ray: No active disease. 4/8>> x-ray left hand: No acute fracture or dislocation. 4/8>> MRI right hand/wrist: No fracture/osteomyelitis-severe flexor Tenosynovitis, second/third MCP joint erosions, enlarged radiocarpal midcarpal joint infusion-suggestive of inflammatory arthropathy. 4/8>> Echo: EF [id number], grade 2 diastolic dysfunction 0000000 CT abdomen/pelvis: No perforation/focal inflammation in the bowel-autosomal dominant polycystic kidney disease.  Mild anasarca. 4/13>> TEE: Vegetation on MV, TV, AV, linear mobile clot in RA cavity  Antimicrobial therapy: Rocephin: 4/6>> 4/8 Zithromax: 4/6>> 4/10 Cefdinir: 4/9>> 4/10 Eraxis: 04/7>>  Microbiology data: 4/5>> blood culture: Candida albicans 4/7>> blood culture: No growth  Pathology: 4/12>> ascending colon biopsy: No inflammation/dysplasia/carcinoma  Procedures : 4/11>> right IJ tunneled CVC 4/12>> EGD: 2 angiectasia is in the stomach-1 with active oozing-s/p APC. 4/12>> colonoscopy: Edema/adherent blood in the proximal ascending colon/cecum-?  Ischemic colitis.  Diverticulosis in the sigmoid/descending colon. 4/13>> TEE: Vegetation on MV, TV, AV, linear mobile clot in RA cavity  Consults: GI, ID, IR, cards,CTVS,renal  DVT Prophylaxis  : enoxaparin (LOVENOX) injection 30 mg Start: 08/30/20 1300 SCDs Start: 08/16/20 1429   Subjective: Lying comfortably in bed-back pain is stable and at baseline.  Assessment/Plan: Acute metabolic encephalopathy: Probably related to fungemia/pneumonia-resolved- her mentation is back to baseline.  Multifocal pneumonia-recent history of COVID-19 infection: Overall improved-completed course of antibiotics.  Cultures as above.  Fungemia with endocarditis involving the mitral, tricuspid and aortic valve On Eraxis-repeat blood cultures negative-ID following.  Not a candidate for surgery-per CT views-patient does not want surgery as well.  Difficult situation-as SNF discharge is planned-however SNF unable to cover the cost of Eraxis-discussed with infectious disease-plan is to continue Eraxis through 4/20-and switch to fluconazole starting 4/21.  Per nephrology-plan is to decrease Prograf to 1 mg in a.m., 0.5 mg nightly from 4/21.  Nephrology recommends obtaining twice weekly renal function and Prograf levels.   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  Patient was started on IV heparin-however she developed epistaxis last night-discussed with Dr. Doylene Canard again on 4/18-he does not advise any further anticoagulation from here out.    Upper GI bleeding with acute blood loss anemia: EGD/colonoscopy as above-no further obvious GI loss apparent-had brown stools on 4/19.  Continue PPI.    Continue PPI.  Biopsy from colonoscopy was negative for malignancy or inflammation.  Epistaxis: Occurred on 4/18-resolved with supportive care  Anemia: Multifactorial-has anemia related to CKD at baseline-worsening hemoglobin this hospitalization is due to acute blood loss anemia from upper GI bleeding, epistaxis and from critical illness.  Hemoglobin stable-now on Aranesp.  Follow CBC periodically.  AKI on CKD  stage IV-renal transplantation: AKI hemodynamically mediated-has trended down-and now close to baseline.  History of renal transplantation: Continue steroids/tacrolimus-on prophylactic Bactrim.  See above regarding plans to reduce dosage of Prograf when fluconazole has been started.  HTN: BP controlled-continue Coreg, hydralazine  HLD: Continue statin  DM-2: CBGs stable-but with a.m. hypoglycemia-decrease Lantus to 5 units-continue SSI.  Follow and adjust.   Recent Labs    08/30/20 2053 08/31/20 0745 08/31/20 1210  GLUCAP 165* 81 129*   Right wrist pain/swelling: See above regarding MRI imaging-prior MD discussed with hand surgery-Dr. Hilbert Corrigan thought that this was related to inflammatory changes-recommendations were to keep splint for extended periods and steroids.  Right wrist swelling seems to have improved compared to the past few days.  Chronic back pain: Continue supportive care.  Deconditioning/debility: Due to acute illness-SNF on discharge.   Diet: Diet Order            Diet Carb Modified Fluid consistency: Thin; Room service appropriate? Yes  Diet effective now                  Code Status: DNR  Family Communication: Spouse-David Bolin-[phone] on 4/20  Disposition Plan:  Status is: Inpatient  Remains inpatient appropriate because:Inpatient level of care appropriate due to severity of illness   Dispo: The patient is from: Home              Anticipated d/c is to: Home              Patient currently is not medically stable to d/c.   Difficult to place patient No   Barriers to Discharge: Fungemia-on Eraxis IV to complete 2 weeks course of intravenous antifungal before transitioning to Diflucan.  Antimicrobial agents: Anti-infectives (From admission, onward)   Start     Dose/Rate Route Frequency Ordered Stop   08/25/20 1000  anidulafungin (ERAXIS) 200 mg in sodium chloride 0.9 % 200 mL IVPB        200 mg 78 mL/hr over 200 Minutes Intravenous  Daily 08/24/20 1445 09/01/20 0959   08/20/20 1000  cefdinir (OMNICEF) capsule 300 mg  Status:  Discontinued        300 mg Oral Daily 08/19/20 0918 08/19/20 0936   08/20/20 1000  cefdinir (OMNICEF) capsule 300 mg        300 mg Oral Daily 08/19/20 0936 08/21/20 0922   08/19/20 1015  azithromycin (ZITHROMAX) tablet 500 mg  Status:  Discontinued        500 mg Oral Daily 08/19/20 0916 08/19/20 0936   08/19/20 1015  azithromycin (ZITHROMAX) tablet 500 mg        500 mg Oral Daily 08/19/20 0936 08/21/20 0921   08/19/20 1000  anidulafungin (ERAXIS) 100 mg in sodium chloride 0.9 % 100 mL IVPB  Status:  Discontinued        100 mg 78 mL/hr over 100 Minutes Intravenous Daily 08/18/20 0247 08/24/20 1445   08/18/20 0400  anidulafungin (ERAXIS) 200 mg in sodium chloride 0.9 % 200 mL IVPB        200 mg 78 mL/hr over 200 Minutes Intravenous  Once 08/18/20 0247 08/18/20 0704   08/17/20 0900  sulfamethoxazole-trimethoprim (BACTRIM) 400-80 MG per tablet 1 tablet        1 tablet Oral Once per day on Mon Wed Fri 08/16/20 1430     08/17/20 0800  cefTRIAXone (ROCEPHIN) 2 g in sodium chloride 0.9 % 100 mL IVPB  Status:  Discontinued        2 g 200 mL/hr over 30 Minutes Intravenous Every 24 hours 08/16/20 1430  08/19/20 0918   08/17/20 0800  azithromycin (ZITHROMAX) 500 mg in sodium chloride 0.9 % 250 mL IVPB  Status:  Discontinued        500 mg 250 mL/hr over 60 Minutes Intravenous Every 24 hours 08/16/20 1430 08/19/20 0916       Time spent: 25 minutes-Greater than 50% of this time was spent in counseling, explanation of diagnosis, planning of further management, and coordination of care.  MEDICATIONS: Scheduled Meds: . carvedilol  25 mg Oral BID  . Chlorhexidine Gluconate Cloth  6 each Topical Daily  . enoxaparin (LOVENOX) injection  30 mg Subcutaneous Q24H  . hydrALAZINE  50 mg Oral Q8H  . insulin aspart  0-9 Units Subcutaneous TID WC  . insulin glargine  5 Units Subcutaneous QHS  . lidocaine  1 patch  Transdermal Q0600  . pantoprazole  40 mg Oral BID  . polyethylene glycol  17 g Oral BID  . predniSONE  10 mg Oral Q breakfast  . rOPINIRole  3 mg Oral QHS  . rosuvastatin  20 mg Oral Daily  . sertraline  100 mg Oral QHS  . sodium bicarbonate  1,300 mg Oral BID  . sodium chloride flush  10-40 mL Intracatheter Q12H  . sulfamethoxazole-trimethoprim  1 tablet Oral Once per day on Mon Wed Fri  . tacrolimus  2 mg Oral Daily   And  . tacrolimus  1 mg Oral QHS   Continuous Infusions: . sodium chloride 10 mL/hr at 08/26/20 1842  . anidulafungin 200 mg (08/31/20 0913)   PRN Meds:.acetaminophen **OR** acetaminophen, albuterol, calcium carbonate (dosed in mg elemental calcium), camphor-menthol **AND** hydrOXYzine, docusate sodium, feeding supplement (NEPRO CARB STEADY), guaiFENesin, hydrALAZINE, HYDROcodone-acetaminophen, lidocaine, lip balm, ondansetron **OR** ondansetron (ZOFRAN) IV, oxyCODONE, oxymetazoline, sodium chloride, sodium chloride flush, sorbitol, zolpidem   PHYSICAL EXAM: Vital signs: Vitals:   08/30/20 2356 08/31/20 0347 08/31/20 0747 08/31/20 1208  BP: 126/89 (!) 129/99 (!) 162/94 105/67  Pulse: 70 71 74 79  Resp: '18 18 15 16  '$ Temp: 98 F (36.7 C) 97.8 F (36.6 C)  98.1 F (36.7 C)  TempSrc: Oral Oral  Oral  SpO2: 96% 96% 96% 94%  Weight:      Height:       Filed Weights   08/20/20 0339 08/21/20 0347 08/24/20 1205  Weight: 57 kg 58 kg 58 kg   Body mass index is 24.16 kg/m.   Gen Exam:Alert awake-not in any distress HEENT:atraumatic, normocephalic Chest: B/L clear to auscultation anteriorly CVS:S1S2 regular Abdomen:soft non tender, non distended Extremities:no edema Neurology: Non focal Skin: no rash  I have personally reviewed following labs and imaging studies  LABORATORY DATA: CBC: Recent Labs  Lab 08/28/20 0254 08/28/20 1707 08/29/20 0227 08/29/20 0449 08/30/20 0808 08/31/20 0235  WBC 5.5 5.2 4.2  --  4.4 5.1  HGB 7.6* 8.4* 7.8* 8.4* 7.7* 8.2*   HCT 24.3* 26.9* 25.2* 26.7* 25.1* 26.8*  MCV 89.0 89.1 89.0  --  89.3 90.5  PLT 199 214 208  --  221 [id number]    Basic Metabolic Panel: Recent Labs  Lab 08/25/20 0452 08/26/20 0047 08/28/20 0254 08/30/20 0808 08/31/20 0235  NA 138 133* 134* 134* 133*  K 3.6 4.2 4.5 5.2* 5.0  CL 107 108 106 106 104  CO2 23 18* '23 22 22  '$ GLUCOSE 79 119* 70 106* 68*  BUN 34* 36* 33* 29* 30*  CREATININE 2.53* 2.57* 2.78* 2.85* 2.99*  CALCIUM 8.1* 7.8* 8.0* 8.6* 8.4*    GFR: Estimated  Creatinine Clearance: 13.7 mL/min (A) (by C-G formula based on SCr of 2.99 mg/dL (H)).  Liver Function Tests: No results for input(s): AST, ALT, ALKPHOS, BILITOT, PROT, ALBUMIN in the last 168 hours. No results for input(s): LIPASE, AMYLASE in the last 168 hours. No results for input(s): AMMONIA in the last 168 hours.  Coagulation Profile: No results for input(s): INR, PROTIME in the last 168 hours.  Cardiac Enzymes: No results for input(s): CKTOTAL, CKMB, CKMBINDEX, TROPONINI in the last 168 hours.  BNP (last 3 results) No results for input(s): PROBNP in the last 8760 hours.  Lipid Profile: No results for input(s): CHOL, HDL, LDLCALC, TRIG, CHOLHDL, LDLDIRECT in the last 72 hours.  Thyroid Function Tests: No results for input(s): TSH, T4TOTAL, FREET4, T3FREE, THYROIDAB in the last 72 hours.  Anemia Panel: Recent Labs    08/31/20 0235 08/31/20 0236  FERRITIN 254  --   TIBC 160*  --   IRON 25*  --   RETICCTPCT  --  1.7    Urine analysis: No results found for: COLORURINE, APPEARANCEUR, LABSPEC, PHURINE, GLUCOSEU, HGBUR, BILIRUBINUR, KETONESUR, PROTEINUR, UROBILINOGEN, NITRITE, LEUKOCYTESUR  Sepsis Labs: Lactic Acid, Venous No results found for: LATICACIDVEN  MICROBIOLOGY: No results found for this or any previous visit (from the past 240 hour(s)).  RADIOLOGY STUDIES/RESULTS: No results found.   LOS: 14 days   Oren Binet, MD  [facility]    To contact the attending provider  between 7A-7P or the covering provider during after hours 7P-7A, please log into the web site [email or web address] and access using universal [place] password for that web site. If you do not have the password, please call the hospital operator.  08/31/2020, 12:14 PM

## 2020-08-31 NOTE — Consult Note (Addendum)
Ref: [name], MD   Subjective:  Awake. No chest pain. Afebrile.  Objective:  Vital Signs in the last 24 hours: Temp:  [97.4 F (36.3 C)-98.2 F (36.8 C)] 98.1 F (36.7 C) (04/20 1208) Pulse Rate:  [70-80] 79 (04/20 1208) Cardiac Rhythm: Normal sinus rhythm (04/20 0700) Resp:  [14-18] 16 (04/20 1208) BP: (105-165)/(67-99) 105/67 (04/20 1208) SpO2:  [91 %-100 %] 94 % (04/20 1208)  Physical Exam: BP Readings from Last 1 Encounters:  08/31/20 105/67     Wt Readings from Last 1 Encounters:  08/24/20 58 kg    Weight change:  Body mass index is 24.16 kg/m. HEENT: [place]/AT, Eyes-Hazel, Conjunctiva-Pale, Sclera-Non-icteric Neck: No JVD, No bruit, Trachea midline. Lungs:  Clear, Bilateral. Cardiac:  Regular rhythm, normal S1 and S2, no S3. II/VI systolic and diastolic murmur. Abdomen:  Soft, non-tender. BS present. Extremities:  No edema present. No cyanosis. No clubbing. CNS: AxOx3, Cranial nerves grossly intact, moves all 4 extremities.  Skin: Warm and dry.   Intake/Output from previous day: 04/19 0701 - 04/20 0700 In: 600 [P.O.:600] Out: 750 [Urine:750]    Lab Results: BMET    Component Value Date/Time   NA 133 (L) 08/31/2020 0235   NA 134 (L) 08/30/2020 0808   NA 134 (L) 08/28/2020 0254   NA 138 07/20/2020 0000   NA 133 (A) 06/22/2020 0000   NA 141 05/25/2020 0000   K 5.0 08/31/2020 0235   K 5.2 (H) 08/30/2020 0808   K 4.5 08/28/2020 0254   CL 104 08/31/2020 0235   CL 106 08/30/2020 0808   CL 106 08/28/2020 0254   CO2 22 08/31/2020 0235   CO2 22 08/30/2020 0808   CO2 23 08/28/2020 0254   GLUCOSE 68 (L) 08/31/2020 0235   GLUCOSE 106 (H) 08/30/2020 0808   GLUCOSE 70 08/28/2020 0254   BUN 30 (H) 08/31/2020 0235   BUN 29 (H) 08/30/2020 0808   BUN 33 (H) 08/28/2020 0254   BUN 41 (A) 07/20/2020 0000   BUN 57 (A) 06/22/2020 0000   BUN 54 (A) 05/25/2020 0000   CREATININE 2.99 (H) 08/31/2020 0235   CREATININE 2.85 (H) 08/30/2020 0808   CREATININE 2.78  (H) 08/28/2020 0254   CALCIUM 8.4 (L) 08/31/2020 0235   CALCIUM 8.6 (L) 08/30/2020 0808   CALCIUM 8.0 (L) 08/28/2020 0254   GFRNONAA 16 (L) 08/31/2020 0235   GFRNONAA 17 (L) 08/30/2020 0808   GFRNONAA 17 (L) 08/28/2020 0254   CBC    Component Value Date/Time   WBC 5.1 08/31/2020 0235   RBC 3.00 (L) 08/31/2020 0236   RBC 2.96 (L) 08/31/2020 0235   HGB 8.2 (L) 08/31/2020 0235   HCT 26.8 (L) 08/31/2020 0235   PLT 259 08/31/2020 0235   MCV 90.5 08/31/2020 0235   MCV 90 06/22/2020 0000   MCH 27.7 08/31/2020 0235   MCHC 30.6 08/31/2020 0235   RDW 17.1 (H) 08/31/2020 0235   LYMPHSABS 0.5 (L) 08/16/2020 1556   MONOABS 0.2 08/16/2020 1556   EOSABS 0.1 08/16/2020 1556   BASOSABS 0.0 08/16/2020 1556   HEPATIC Function Panel Recent Labs    08/16/20 1556 08/18/20 0232 08/19/20 0158  PROT 6.0* 5.0* 5.0*   HEMOGLOBIN A1C No components found for: HGA1C,  MPG CARDIAC ENZYMES No results found for: CKTOTAL, CKMB, CKMBINDEX, TROPONINI BNP No results for input(s): PROBNP in the last 8760 hours. TSH Recent Labs    08/16/20 1556  TSH 2.903   CHOLESTEROL No results for input(s): CHOL in  the last 8760 hours.  Scheduled Meds: . carvedilol  25 mg Oral BID  . Chlorhexidine Gluconate Cloth  6 each Topical Daily  . darbepoetin (ARANESP) injection - NON-DIALYSIS  40 mcg Subcutaneous Once  . enoxaparin (LOVENOX) injection  30 mg Subcutaneous Q24H  . [START ON 09/01/2020] fluconazole  400 mg Oral Daily  . hydrALAZINE  50 mg Oral Q8H  . insulin aspart  0-9 Units Subcutaneous TID WC  . insulin glargine  5 Units Subcutaneous QHS  . lidocaine  1 patch Transdermal Q0600  . pantoprazole  40 mg Oral BID  . polyethylene glycol  17 g Oral BID  . predniSONE  10 mg Oral Q breakfast  . rOPINIRole  3 mg Oral QHS  . rosuvastatin  20 mg Oral Daily  . sertraline  100 mg Oral QHS  . sodium bicarbonate  1,300 mg Oral BID  . sodium chloride flush  10-40 mL Intracatheter Q12H  .  sulfamethoxazole-trimethoprim  1 tablet Oral Once per day on Mon Wed Fri  . [START ON 09/01/2020] tacrolimus  1 mg Oral Daily   And  . tacrolimus  1 mg Oral QHS   Continuous Infusions: . 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 F/U TEE in 3 months.   LOS: 14 days   Time spent including chart review, lab review, examination, discussion with patient/Family : 30 min   Dixie Dials  MD  08/31/2020, 1:57 PM

## 2020-08-31 NOTE — Consult Note (Signed)
Ref: [name], MD  Late Entry:  Subjective:  Awake. No chest pain. Afebrile.  Objective:  Vital Signs in the last 24 hours: Temp:  [97.4 F (36.3 C)-98.2 F (36.8 C)] 98.1 F (36.7 C) (04/20 1208) Pulse Rate:  [70-80] 79 (04/20 1208) Cardiac Rhythm: Normal sinus rhythm (04/20 0700) Resp:  [14-18] 16 (04/20 1208) BP: (105-165)/(67-99) 105/67 (04/20 1208) SpO2:  [91 %-100 %] 94 % (04/20 1208)  Physical Exam: BP Readings from Last 1 Encounters:  08/31/20 105/67     Wt Readings from Last 1 Encounters:  08/24/20 58 kg    Weight change:  Body mass index is 24.16 kg/m. HEENT: [place]/AT, Eyes-Hazel, Conjunctiva-Pale, Sclera-Non-icteric Neck: No JVD, No bruit, Trachea midline. Lungs:  Clear, Bilateral. Cardiac:  Regular rhythm, normal S1 and S2, no S3. III/VI systolic and diastolic murmur. Abdomen:  Soft, non-tender. BS present. Extremities:  No edema present. No cyanosis. No clubbing. CNS: AxOx3, Cranial nerves grossly intact, moves all 4 extremities.  Skin: Warm and dry.   Intake/Output from previous day: 04/19 0701 - 04/20 0700 In: 600 [P.O.:600] Out: 750 [Urine:750]    Lab Results: BMET    Component Value Date/Time   NA 133 (L) 08/31/2020 0235   NA 134 (L) 08/30/2020 0808   NA 134 (L) 08/28/2020 0254   NA 138 07/20/2020 0000   NA 133 (A) 06/22/2020 0000   NA 141 05/25/2020 0000   K 5.0 08/31/2020 0235   K 5.2 (H) 08/30/2020 0808   K 4.5 08/28/2020 0254   CL 104 08/31/2020 0235   CL 106 08/30/2020 0808   CL 106 08/28/2020 0254   CO2 22 08/31/2020 0235   CO2 22 08/30/2020 0808   CO2 23 08/28/2020 0254   GLUCOSE 68 (L) 08/31/2020 0235   GLUCOSE 106 (H) 08/30/2020 0808   GLUCOSE 70 08/28/2020 0254   BUN 30 (H) 08/31/2020 0235   BUN 29 (H) 08/30/2020 0808   BUN 33 (H) 08/28/2020 0254   BUN 41 (A) 07/20/2020 0000   BUN 57 (A) 06/22/2020 0000   BUN 54 (A) 05/25/2020 0000   CREATININE 2.99 (H) 08/31/2020 0235   CREATININE 2.85 (H) 08/30/2020 0808    CREATININE 2.78 (H) 08/28/2020 0254   CALCIUM 8.4 (L) 08/31/2020 0235   CALCIUM 8.6 (L) 08/30/2020 0808   CALCIUM 8.0 (L) 08/28/2020 0254   GFRNONAA 16 (L) 08/31/2020 0235   GFRNONAA 17 (L) 08/30/2020 0808   GFRNONAA 17 (L) 08/28/2020 0254   CBC    Component Value Date/Time   WBC 5.1 08/31/2020 0235   RBC 3.00 (L) 08/31/2020 0236   RBC 2.96 (L) 08/31/2020 0235   HGB 8.2 (L) 08/31/2020 0235   HCT 26.8 (L) 08/31/2020 0235   PLT 259 08/31/2020 0235   MCV 90.5 08/31/2020 0235   MCV 90 06/22/2020 0000   MCH 27.7 08/31/2020 0235   MCHC 30.6 08/31/2020 0235   RDW 17.1 (H) 08/31/2020 0235   LYMPHSABS 0.5 (L) 08/16/2020 1556   MONOABS 0.2 08/16/2020 1556   EOSABS 0.1 08/16/2020 1556   BASOSABS 0.0 08/16/2020 1556   HEPATIC Function Panel Recent Labs    08/16/20 1556 08/18/20 0232 08/19/20 0158  PROT 6.0* 5.0* 5.0*   HEMOGLOBIN A1C No components found for: HGA1C,  MPG CARDIAC ENZYMES No results found for: CKTOTAL, CKMB, CKMBINDEX, TROPONINI BNP No results for input(s): PROBNP in the last 8760 hours. TSH Recent Labs    08/16/20 1556  TSH 2.903   CHOLESTEROL No results for input(s):  CHOL in the last 8760 hours.  Scheduled Meds: . carvedilol  25 mg Oral BID  . Chlorhexidine Gluconate Cloth  6 each Topical Daily  . darbepoetin (ARANESP) injection - NON-DIALYSIS  40 mcg Subcutaneous Once  . enoxaparin (LOVENOX) injection  30 mg Subcutaneous Q24H  . [START ON 09/01/2020] fluconazole  400 mg Oral Daily  . hydrALAZINE  50 mg Oral Q8H  . insulin aspart  0-9 Units Subcutaneous TID WC  . insulin glargine  5 Units Subcutaneous QHS  . lidocaine  1 patch Transdermal Q0600  . pantoprazole  40 mg Oral BID  . polyethylene glycol  17 g Oral BID  . predniSONE  10 mg Oral Q breakfast  . rOPINIRole  3 mg Oral QHS  . rosuvastatin  20 mg Oral Daily  . sertraline  100 mg Oral QHS  . sodium bicarbonate  1,300 mg Oral BID  . sodium chloride flush  10-40 mL Intracatheter Q12H  .  sulfamethoxazole-trimethoprim  1 tablet Oral Once per day on Mon Wed Fri  . [START ON 09/01/2020] tacrolimus  1 mg Oral Daily   And  . tacrolimus  1 mg Oral QHS   Continuous Infusions: . sodium chloride 10 mL/hr at 08/26/20 1842   PRN Meds:.acetaminophen **OR** acetaminophen, albuterol, calcium carbonate (dosed in mg elemental calcium), camphor-menthol **AND** hydrOXYzine, docusate sodium, feeding supplement (NEPRO CARB STEADY), guaiFENesin, hydrALAZINE, HYDROcodone-acetaminophen, lidocaine, lip balm, ondansetron **OR** ondansetron (ZOFRAN) IV, oxyCODONE, oxymetazoline, sodium chloride, sodium chloride flush, sorbitol, zolpidem  Assessment/Plan: Fungemia MV, TV and AV endocarditis Possible RA thrombus S/P encephalopathy S/P pneumonia with COVID infection Upper GI bleed Epistaxis Anemia of blood loss CKD, IV S/P renal transplant Type 2 DM HTN HLD Right wrist and hand swelling and pain  Plan: Continue medical treatment.   LOS: 14 days   Time spent including chart review, lab review, examination, discussion with patient/Family : 30 min   Dixie Dials  MD  08/31/2020, 1:52 PM

## 2020-08-31 NOTE — Progress Notes (Signed)
Physical Therapy Treatment Patient Details Name: Ruth Riley MRN: [id number] DOB: [birth year] Today's Date: 08/31/2020    History of Present Illness Pt is 75 y.o. female who presented to [facility] with confusion, SoB, and cough and was transferred to [facility] and admitted 08/16/20. Pt being treated for anemia, bilateral PNA and possible GI bleed.  On 4/13 TEE found  small vegetations on MV, TV, AV and a mobile clot in the RA cavity. Pt started on heparin. Pt thought to have fx L 4th finger and was splinted - later found to not be broken and splint off.  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  PMH: DM; HTN; and h/o renal transplant with current stage 4 CKD (baseline 3.7) with anemia (baseline 9), COVID infection in early February. hx of multiple falls and orthopedic complaints most recently L ring finger fx.    PT Comments    Pt limited to only briefly sitting EOB due to back pain today. Unable to tolerate further mobility and returned to sidelying and positioned for incr comfort.    Follow Up Recommendations  SNF     Equipment Recommendations  Other (comment) (R platform RW)    Recommendations for Other Services       Precautions / Restrictions Precautions Precautions: Fall Required Braces or Orthoses: Other Brace Splint/Cast: R wrist brace at all times; foam arm elevator Restrictions Other Position/Activity Restrictions: No weightbearing restrictions ordered - pt unaware of any restrictions and reports R wrist injury present prior to admission    Mobility  Bed Mobility Overal bed mobility: Needs Assistance Bed Mobility: Rolling;Sidelying to Sit;Sit to Sidelying Rolling: Min  assist Sidelying to sit: Min assist;HOB elevated     Sit to sidelying: Min assist;HOB elevated General bed mobility comments: Incr time for all mobility. Assist to elevate trunk into sitting and assist to bring legs back into bed returning to sidelying    Transfers                 General transfer comment: Pt could not attempt due to back pain  Ambulation/Gait             General Gait Details: Unable to attempt due to back pain   Stairs             Wheelchair Mobility    Modified Rankin (Stroke Patients Only)       Balance Overall balance assessment: Needs assistance Sitting-balance support: No upper extremity supported;Feet supported Sitting balance-Leahy Scale: Fair                                      Cognition Arousal/Alertness: Awake/alert Behavior During Therapy: WFL for tasks assessed/performed Overall Cognitive Status: Within Functional Limits for tasks assessed (for simple mobility tasks)                                        Exercises      General Comments General comments (skin integrity, edema, etc.): VSS on RA      Pertinent Vitals/Pain Pain Assessment: Faces Faces Pain Scale: Hurts  whole lot Pain Descriptors / Indicators: Grimacing;Aching Pain Intervention(s): Limited activity within patient's tolerance;Monitored during session;Repositioned    Home Living                      Prior Function            PT Goals (current goals can now be found in the care plan section) Acute Rehab PT Goals Patient Stated Goal: feel better PT Goal Formulation: With patient Time For Goal Achievement: 09/14/20 Potential to Achieve Goals: Fair Progress towards PT goals: Goals downgraded-see care plan    Frequency    Min 2X/week      PT Plan Current plan remains appropriate    Co-evaluation              AM-PAC PT "6 Clicks" Mobility   Outcome Measure  Help needed turning from your  back to your side while in a flat bed without using bedrails?: A Little Help needed moving from lying on your back to sitting on the side of a flat bed without using bedrails?: A Little Help needed moving to and from a bed to a chair (including a wheelchair)?: Total Help needed standing up from a chair using your arms (e.g., wheelchair or bedside chair)?: Total Help needed to walk in hospital room?: Total Help needed climbing 3-5 steps with a railing? : Total 6 Click Score: 10    End of Session Equipment Utilized During Treatment:  (wrist brace) Activity Tolerance: Patient limited by pain Patient left: with call bell/phone within reach;in bed;with bed alarm set;with family/visitor present (R arm elevated)   PT Visit Diagnosis: Unsteadiness on feet (R26.81);Muscle weakness (generalized) (M62.81);History of falling (Z91.81);Difficulty in walking, not elsewhere classified (R26.2);Pain Pain - part of body:  (back)     Time: [id number] PT Time Calculation (min) (ACUTE ONLY): 25 min  Charges:  $Therapeutic Activity: 8-22 mins                     [place] Pager [phone] Office [place] 08/31/2020, 2:58 PM

## 2020-09-01 ENCOUNTER — Telehealth: Payer: Self-pay

## 2020-09-01 ENCOUNTER — Inpatient Hospital Stay (HOSPITAL_COMMUNITY): Payer: Medicare Other

## 2020-09-01 HISTORY — PX: IR REMOVAL TUN CV CATH W/O FL: IMG2289

## 2020-09-01 LAB — LIPID PANEL
Cholesterol: 112 mg/dL (ref 0–200)
HDL: 51 mg/dL (ref >40)
LDL Cholesterol: 42 mg/dL (ref 0–99)
Total CHOL/HDL Ratio: 2.2 RATIO
Triglycerides: 96 mg/dL (ref <150)
VLDL: 19 mg/dL (ref 0–40)

## 2020-09-01 LAB — COMPREHENSIVE METABOLIC PANEL
ALT: 19 U/L (ref 0–44)
AST: 23 U/L (ref 15–41)
Albumin: 1.6 g/dL — ABNORMAL LOW (ref 3.5–5.0)
Alkaline Phosphatase: 104 U/L (ref 38–126)
Anion gap: 5 (ref 5–15)
BUN: 32 mg/dL — ABNORMAL HIGH (ref 8–23)
CO2: 24 mmol/L (ref 22–32)
Calcium: 8.2 mg/dL — ABNORMAL LOW (ref 8.9–10.3)
Chloride: 107 mmol/L (ref 98–111)
Creatinine, Ser: 3.13 mg/dL — ABNORMAL HIGH (ref 0.44–1.00)
GFR, Estimated: 15 mL/min — ABNORMAL LOW (ref >60)
Glucose, Bld: 133 mg/dL — ABNORMAL HIGH (ref 70–99)
Potassium: 5.4 mmol/L — ABNORMAL HIGH (ref 3.5–5.1)
Sodium: 136 mmol/L (ref 135–145)
Total Bilirubin: 0.3 mg/dL (ref 0.3–1.2)
Total Protein: 4.6 g/dL — ABNORMAL LOW (ref 6.5–8.1)

## 2020-09-01 LAB — GLUCOSE, CAPILLARY
Glucose-Capillary: 101 mg/dL — ABNORMAL HIGH (ref 70–99)
Glucose-Capillary: 148 mg/dL — ABNORMAL HIGH (ref 70–99)

## 2020-09-01 MED ORDER — LANTUS SOLOSTAR 100 UNIT/ML ~~LOC~~ SOPN: 5.0000 [IU] | PEN_INJECTOR | Freq: Every day | SUBCUTANEOUS | 11 refills | Status: AC

## 2020-09-01 MED ORDER — PREDNISONE 10 MG PO TABS: 10.0000 mg | ORAL_TABLET | Freq: Every day | ORAL | Status: AC

## 2020-09-01 MED ORDER — SODIUM ZIRCONIUM CYCLOSILICATE 10 G PO PACK: 10.0000 g | PACK | Freq: Two times a day (BID) | ORAL | Status: AC

## 2020-09-01 MED ORDER — TACROLIMUS 1 MG PO CAPS
1.0000 mg | ORAL_CAPSULE | Freq: Every day | ORAL | Status: DC
  Administered 2020-09-01: 1 mg via ORAL
  Filled 2020-09-01: qty 1

## 2020-09-01 MED ORDER — TACROLIMUS 1 MG PO CAPS: 1.0000 mg | ORAL_CAPSULE | Freq: Every morning | ORAL | Status: AC

## 2020-09-01 MED ORDER — NEPRO/CARBSTEADY PO LIQD: 237.0000 mL | Freq: Three times a day (TID) | ORAL | 0 refills | Status: AC | PRN

## 2020-09-01 MED ORDER — TACROLIMUS 0.5 MG PO CAPS
0.5000 mg | ORAL_CAPSULE | Freq: Every day | ORAL | Status: DC
  Filled 2020-09-01: qty 1

## 2020-09-01 MED ORDER — OXYCODONE HCL 5 MG PO TABS: 5.0000 mg | ORAL_TABLET | ORAL | 0 refills | Status: AC | PRN

## 2020-09-01 MED ORDER — SODIUM ZIRCONIUM CYCLOSILICATE 10 G PO PACK: 10.0000 g | PACK | Freq: Two times a day (BID) | ORAL | Status: DC

## 2020-09-01 MED ORDER — HYDRALAZINE HCL 50 MG PO TABS: 50.0000 mg | ORAL_TABLET | Freq: Three times a day (TID) | ORAL | Status: AC

## 2020-09-01 MED ORDER — TACROLIMUS 0.5 MG PO CAPS: 0.5000 mg | ORAL_CAPSULE | Freq: Every day | ORAL | Status: AC

## 2020-09-01 MED ORDER — FLUCONAZOLE 200 MG PO TABS: 400.0000 mg | ORAL_TABLET | Freq: Every day | ORAL | Status: AC

## 2020-09-01 MED ORDER — SODIUM ZIRCONIUM CYCLOSILICATE 10 G PO PACK
10.0000 g | PACK | Freq: Three times a day (TID) | ORAL | Status: DC
  Administered 2020-09-01 (×2): 10 g via ORAL
  Filled 2020-09-01 (×2): qty 1

## 2020-09-01 MED ORDER — INSULIN ASPART 100 UNIT/ML ~~LOC~~ SOLN: SUBCUTANEOUS | 11 refills | Status: AC

## 2020-09-01 NOTE — Progress Notes (Signed)
ID PROGRESS NOTE  Patient feeling slightly stronger, ambulated with walker to her door within the room. 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 Recommendations to do medical treatment with likely life long suppression. Today is day 15 of treatment, being switched from anidulafungin to oral fluconazole '400mg'$  daily (SNF unable to accept patient on echinocandins) however, fluconazole still would work in treatment but have to adjust her home medications as well as immunesuppression due to tacrolimus interaction  Renal transplant and immunesuppression = plan today is to 1/2 her dosage of tacro to '1mg'$  Qam and 0.'5mg'$  QPM. Will get tacro levels on Sunday and Thursday. Her nephrologist to follow. Plus pred '10mg'$  daily  oi proph = continue on bactrim  Drug interactions = will recommend to discontinue rosuvastatin at this time. LDL has been well controlled. In risk-benefit ratio, she is more at risk for drug interaction then benefits from lipid lowering agent.  Will arrange for telephone follow up in 2-4 wk.

## 2020-09-01 NOTE — Telephone Encounter (Signed)
-----   Message from Carlyle Basques, MD sent at 09/01/2020 12:33 PM EDT ----- Can we arrange for phone visit in 2-4 wk

## 2020-09-01 NOTE — Consult Note (Signed)
Ref: Ruth Riley., MD   Subjective:  Awake. Afebrile. VS stable. Awaiting SNF/Rehab placement. No change in heart murmur auscultation over 2 weeks period. She is not a candidate for surgical treatment.   Objective:  Vital Signs in the last 24 hours: Temp:  [97.3 F (36.3 C)-98.9 F (37.2 C)] 98.1 F (36.7 C) (04/21 0746) Pulse Rate:  [75-83] 80 (04/21 0746) Cardiac Rhythm: Normal sinus rhythm (04/21 0700) Resp:  [15-20] 16 (04/21 0746) BP: (105-158)/(67-98) 138/79 (04/21 0400) SpO2:  [92 %-98 %] 95 % (04/21 0746)  Physical Exam: BP Readings from Last 1 Encounters:  09/01/20 138/79     Wt Readings from Last 1 Encounters:  08/24/20 58 kg    Weight change:  Body mass index is 24.16 kg/m. HEENT: [place]/AT, Eyes-Hazel, Conjunctiva-Pale, Sclera-Non-icteric Neck: No JVD, No bruit, Trachea midline. Lungs:  Clear, Bilateral. Cardiac:  Regular rhythm, normal S1 and S2, no S3. II/VI systolic and diastolic murmur. Abdomen:  Soft, non-tender. BS present. Extremities:  No edema present. No cyanosis. No clubbing. Chronic right hand and finger edema, somewhat improving slowly over 2 weeks. CNS: AxOx3, Cranial nerves grossly intact, moves all 4 extremities.  Skin: Warm and dry.   Intake/Output from previous day: 04/20 0701 - 04/21 0700 In: 240 [P.O.:240] Out: 700 [Urine:700]    Lab Results: BMET    Component Value Date/Time   NA 136 09/01/2020 0407   NA 133 (L) 08/31/2020 0235   NA 134 (L) 08/30/2020 0808   NA 138 07/20/2020 0000   NA 133 (A) 06/22/2020 0000   NA 141 05/25/2020 0000   K 5.4 (H) 09/01/2020 0407   K 5.0 08/31/2020 0235   K 5.2 (H) 08/30/2020 0808   CL 107 09/01/2020 0407   CL 104 08/31/2020 0235   CL 106 08/30/2020 0808   CO2 24 09/01/2020 0407   CO2 22 08/31/2020 0235   CO2 22 08/30/2020 0808   GLUCOSE 133 (H) 09/01/2020 0407   GLUCOSE 68 (L) 08/31/2020 0235   GLUCOSE 106 (H) 08/30/2020 0808   BUN 32 (H) 09/01/2020 0407   BUN 30 (H) 08/31/2020 0235    BUN 29 (H) 08/30/2020 0808   BUN 41 (A) 07/20/2020 0000   BUN 57 (A) 06/22/2020 0000   BUN 54 (A) 05/25/2020 0000   CREATININE 3.13 (H) 09/01/2020 0407   CREATININE 2.99 (H) 08/31/2020 0235   CREATININE 2.85 (H) 08/30/2020 0808   CALCIUM 8.2 (L) 09/01/2020 0407   CALCIUM 8.4 (L) 08/31/2020 0235   CALCIUM 8.6 (L) 08/30/2020 0808   GFRNONAA 15 (L) 09/01/2020 0407   GFRNONAA 16 (L) 08/31/2020 0235   GFRNONAA 17 (L) 08/30/2020 0808   CBC    Component Value Date/Time   WBC 5.1 08/31/2020 0235   RBC 3.00 (L) 08/31/2020 0236   RBC 2.96 (L) 08/31/2020 0235   HGB 8.2 (L) 08/31/2020 0235   HCT 26.8 (L) 08/31/2020 0235   PLT 259 08/31/2020 0235   MCV 90.5 08/31/2020 0235   MCV 90 06/22/2020 0000   MCH 27.7 08/31/2020 0235   MCHC 30.6 08/31/2020 0235   RDW 17.1 (H) 08/31/2020 0235   LYMPHSABS 0.5 (L) 08/16/2020 1556   MONOABS 0.2 08/16/2020 1556   EOSABS 0.1 08/16/2020 1556   BASOSABS 0.0 08/16/2020 1556   HEPATIC Function Panel Recent Labs    08/18/20 0232 08/19/20 0158 09/01/20 0407  PROT 5.0* 5.0* 4.6*   HEMOGLOBIN A1C No components found for: HGA1C,  MPG CARDIAC ENZYMES No results found for: CKTOTAL,  CKMB, CKMBINDEX, TROPONINI BNP No results for input(s): PROBNP in the last 8760 hours. TSH Recent Labs    08/16/20 1556  TSH 2.903   CHOLESTEROL Recent Labs    09/01/20 0407  CHOL 112    Scheduled Meds: . carvedilol  25 mg Oral BID  . Chlorhexidine Gluconate Cloth  6 each Topical Daily  . enoxaparin (LOVENOX) injection  30 mg Subcutaneous Q24H  . fluconazole  400 mg Oral Daily  . hydrALAZINE  50 mg Oral Q8H  . insulin aspart  0-9 Units Subcutaneous TID WC  . insulin glargine  5 Units Subcutaneous QHS  . lidocaine  1 patch Transdermal Q0600  . pantoprazole  40 mg Oral BID  . polyethylene glycol  17 g Oral BID  . predniSONE  10 mg Oral Q breakfast  . rOPINIRole  3 mg Oral QHS  . sertraline  100 mg Oral QHS  . sodium bicarbonate  1,300 mg Oral BID  .  sodium chloride flush  10-40 mL Intracatheter Q12H  . sodium zirconium cyclosilicate  10 g Oral TID  . [START ON 09/02/2020] sodium zirconium cyclosilicate  10 g Oral BID  . sulfamethoxazole-trimethoprim  1 tablet Oral Once per day on Mon Wed Fri  . tacrolimus  0.5 mg Oral QHS   And  . tacrolimus  1 mg Oral Daily   Continuous Infusions: . 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   LOS: 15 days   Time spent including chart review, lab review, examination, discussion with patient/Nurse/Doctor : 30 min   Dixie Dials  MD  09/01/2020, 10:37 AM

## 2020-09-01 NOTE — Plan of Care (Signed)
Patient is currently resting in bed. C/o pain, given PRN norco and oxy. Q2 turns. VSS. Plan for possible d/c today. Call bell within reach. Bed alarm on.   Problem: Education: Goal: Knowledge of General Education information will improve Description: Including pain rating scale, medication(s)/side effects and non-pharmacologic comfort measures Outcome: Progressing   Problem: Health Behavior/Discharge Planning: Goal: Ability to manage health-related needs will improve Outcome: Progressing   Problem: Clinical Measurements: Goal: Ability to maintain clinical measurements within normal limits will improve Outcome: Progressing Goal: Will remain free from infection Outcome: Progressing Goal: Diagnostic test results will improve Outcome: Progressing Goal: Respiratory complications will improve Outcome: Progressing Goal: Cardiovascular complication will be avoided Outcome: Progressing   Problem: Activity: Goal: Risk for activity intolerance will decrease Outcome: Progressing   Problem: Nutrition: Goal: Adequate nutrition will be maintained Outcome: Progressing   Problem: Coping: Goal: Level of anxiety will decrease Outcome: Progressing   Problem: Elimination: Goal: Will not experience complications related to bowel motility Outcome: Progressing Goal: Will not experience complications related to urinary retention Outcome: Progressing   Problem: Pain Managment: Goal: General experience of comfort will improve Outcome: Progressing   Problem: Safety: Goal: Ability to remain free from injury will improve Outcome: Progressing   Problem: Skin Integrity: Goal: Risk for impaired skin integrity will decrease Outcome: Progressing

## 2020-09-01 NOTE — TOC Transition Note (Signed)
Transition of Care [facility]) - CM/SW Discharge Note   Patient Details  Name: [name] MRN: [id number] Date of Birth: [birth year]  [facility] of [facility]) CM/SW Contact:  Glennon Hamilton, [place] Work Phone Number: 09/01/2020, 1:25 PM   Clinical Narrative:    Patient will DC to: [facility] Anticipated DC date: 09/01/20 Family notified: Husband Transport by: Corey Harold   Per MD patient ready for DC to [facility]. RN to call report prior to discharge [phone]). RN, patient, patient's family, and facility notified of DC. Discharge Summary and FL2 sent to facility. DC packet on chart. Ambulance transport requested for patient.   CSW will sign off for now as social work intervention is no longer needed. Please consult [place] again if new needs arise.      Final next level of care: Skilled Nursing Facility Barriers to Discharge: No Barriers Identified   Patient Goals and CMS Choice   CMS Medicare.gov Compare Post Acute Care list provided to:: Patient Choice offered to / list presented to : Patient  Discharge Placement   Existing PASRR number confirmed : 09/01/20          Patient chooses bed at: [facility] [place] Patient to be transferred to facility by: [facility]   Patient and family notified of of transfer: 09/01/20  Discharge Plan and Services In-house Referral: Clinical Social Work Discharge Planning Services: [employer] Consult Post Acute Care Choice: [place]                               Social Determinants of Health (SDOH) Interventions     Readmission Risk Interventions No flowsheet data found.

## 2020-09-01 NOTE — Progress Notes (Signed)
[facility] here to pick pt up.  

## 2020-09-01 NOTE — Progress Notes (Signed)
Right IJ tunneled CVC removed per request from Dr. Sloan Leiter. Patient tolerated removal well. Site is covered with gauze/tegaderm.  Soyla Dryer, [place] [phone] 09/01/2020, 2:04 PM

## 2020-09-01 NOTE — Discharge Summary (Signed)
PATIENT DETAILS Name: Ruth Riley Age: 75 y.o. Sex: female Date of Birth: 12-07-1945 MRN: [id number]. Admitting Physician: Karmen Bongo, MD [id number], [name], MD  Admit Date: 08/16/2020 Discharge date: 09/01/2020  Recommendations for Outpatient Follow-up:  1. Follow up with PCP in 1-2 weeks 2. Please obtain Prograf levels/renal function test every Sunday and Thursday.  Please fax results to patient's primary nephrologist's office (Dr Myles Rosenthal) 3. Please ensure follow-up with nephrology, infectious disease and cardiology. 4. Consider palliative care evaluation at some point while at SNF.  Admitted From:  [facility]  Disposition: [place]: No  Equipment/Devices: None  Discharge Condition: Stable  CODE STATUS:  DNR  Diet recommendation:  Diet Order            Diet - low sodium heart healthy           Diet Carb Modified           Diet Carb Modified Fluid consistency: Thin; Room service appropriate? Yes  Diet effective now                  Brief Narrative: Patient is a 75 y.o. female with history of renal transplant, CKD stage IV, DM-2, HTN, HLD-presented to the hospital with multiple orthopedic complaints-s/p multiple falls with both right/left hand injury, confusion, worsening anemia-she was subsequently transferred from [facility] to [facility] for GI evaluation.  See below for further details.   Significant events: 4/5>> transferred from [facility] to [facility]  Significant studies: 4/6>> chest x-ray: No active disease. 4/8>> x-ray left hand: No acute fracture or dislocation. 4/8>> MRI right hand/wrist: No fracture/osteomyelitis-severe flexor Tenosynovitis, second/third MCP joint erosions, enlarged radiocarpal midcarpal joint infusion-suggestive of inflammatory arthropathy. 4/8>> Echo: EF [id number], grade 2 diastolic dysfunction 0000000 CT abdomen/pelvis: No perforation/focal inflammation in the bowel-autosomal dominant  polycystic kidney disease.  Mild anasarca. 4/13>> TEE: Vegetation on MV, TV, AV, linear mobile clot in RA cavity  Antimicrobial therapy: Rocephin: 4/6>> 4/8 Zithromax: 4/6>> 4/10 Cefdinir: 4/9>> 4/10 Eraxis: 04/7>>4/20 Diflucan 4/21>>  Microbiology data: 4/5>> blood culture: Candida albicans 4/7>> blood culture: No growth  Pathology: 4/12>> ascending colon biopsy: No inflammation/dysplasia/carcinoma  Procedures : 4/11>> right IJ tunneled CVC 4/12>> EGD: 2 angiectasia is in the stomach-1 with active oozing-s/p APC. 4/12>> colonoscopy: Edema/adherent blood in the proximal ascending colon/cecum-?  Ischemic colitis.  Diverticulosis in the sigmoid/descending colon. 4/13>> TEE: Vegetation on MV, TV, AV, linear mobile clot in RA cavity  Consults: GI, ID, IR, cards,CTVS,renal   Brief Hospital Course: Acute metabolic encephalopathy: Probably related to fungemia/pneumonia-resolved- her mentation is back to baseline.  Multifocal pneumonia-recent history of COVID-19 infection: Overall improved-completed course of antibiotics.  Cultures as above.  Fungemia with endocarditis involving the mitral, tricuspid and aortic valve On Eraxis-repeat blood cultures negative-ID following.  Not a candidate for surgery-per CT views-patient does not want surgery as well.  Difficult situation-as SNF discharge is planned-however SNF unable to cover the cost of Eraxis-discussed with infectious disease-plan is to continue Eraxis through 4/20-and switch to fluconazole starting 4/21.  Per nephrology-plan is to decrease Prograf to 1 mg in a.m., 0.5 mg nightly from 4/21.  Nephrology recommends obtaining twice weekly renal function and Prograf levels.  Please ensure follow-up with infectious disease and with her primary nephrologist.  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  Patient was started on  IV heparin-however she developed epistaxis  last night-discussed with Dr. Doylene Canard again on 4/18-he does not advise any further anticoagulation from here out.    Upper GI bleeding with acute blood loss anemia: EGD/colonoscopy as above-no further obvious GI loss apparent-had brown stools on 4/19.  Continue PPI.    Continue PPI.  Biopsy from colonoscopy was negative for malignancy or inflammation.  Epistaxis: Occurred on 4/18-resolved with supportive care  Anemia: Multifactorial-has anemia related to CKD at baseline-worsening hemoglobin this hospitalization is due to acute blood loss anemia from upper GI bleeding, epistaxis and from critical illness.  Hemoglobin stable-placed on Aranesp during this hospitalization.  Follow CBC periodically.  AKI on CKD stage IV-renal transplantation: AKI hemodynamically mediated-has trended down-and now close to baseline.  Nephrology was consulted during this hospital stay-nephrologist got in touch with her primary transplant nephrologist-plans as above regarding adjustment of her immunosuppressive medication.  Per nephrology-patient has been informed that she is slowly losing her allograft-she will require close outpatient follow-up with her primary nephrologist.  History of renal transplantation: Continue steroids/tacrolimus-on prophylactic Bactrim.  See above regarding plans to reduce dosage of Prograf when fluconazole has been started.  Hyperkalemia: Mild-she will get Lokelma starting today-please check electrolytes in a few days.  Lokelma will be continued on discharge.  HTN: BP controlled-continue Coreg, hydralazine  HLD: Statin stopped-to prevent interaction with fluconazole/Prograf.  DM-2: CBGs stable-but with a.m. hypoglycemia-remains on Lantus to 5 units-continue SSI.  Follow and adjust.    Discharge Diagnoses:  Principal Problem:   Fungal endocarditis Active Problems:   Anemia in chronic kidney disease   Acute metabolic encephalopathy    Diabetes mellitus type 2 in nonobese [facility])   Essential hypertension   H/O kidney transplant   Stage 4 chronic kidney disease (HCC)   Bilateral wrist pain   Multifocal pneumonia   History of COVID-19   Chronic back pain   Blood in the stool   Heme positive stool   Acute on chronic anemia   Inflammation of colonic mucosa   Angiodysplasia of stomach with hemorrhage   Left hand fracture   Gastrointestinal hemorrhage associated with duodenitis   Mural thrombus of cardiac apex   Goals of care, counseling/discussion   Discharge Instructions:  Activity:  As tolerated with Full fall precautions use walker/cane & assistance as needed   Discharge Instructions    Diet - low sodium heart healthy   Complete by: As directed    Diet Carb Modified   Complete by: As directed    Discharge instructions   Complete by: As directed    Check CBGs before meals and at bedtime   Increase activity slowly   Complete by: As directed    No wound care   Complete by: As directed      Allergies as of 09/01/2020      Reactions   Penicillins Hives, Other (See Comments)   Tramadol Other (See Comments), Palpitations   Patient reports it makes her feel spaced out Patient reports it makes her feel spaced out Patient reports it makes her feel spaced out Patient reports it makes her feel spaced out   Donepezil Diarrhea, Other (See Comments)   Amlodipine Swelling   dizziness      Medication List    STOP taking these medications   HumaLOG KwikPen 100 UNIT/ML KwikPen Generic drug: insulin lispro   rosuvastatin 40 MG tablet Commonly known as: CRESTOR     TAKE these medications   acetaminophen 500 MG tablet Commonly known as: TYLENOL Take 500 mg by mouth every  6 (six) hours as needed for moderate pain.   carvedilol 25 MG tablet Commonly known as: COREG Take 25 mg by mouth 2 (two) times daily.   cyanocobalamin 1000 MCG tablet Take by mouth.   feeding supplement (NEPRO CARB STEADY) Liqd Take  237 mLs by mouth 3 (three) times daily as needed (Supplement).   fluconazole 200 MG tablet Commonly known as: DIFLUCAN Take 2 tablets (400 mg total) by mouth daily. Start taking on: September 02, 2020   hydrALAZINE 50 MG tablet Commonly known as: APRESOLINE Take 1 tablet (50 mg total) by mouth every 8 (eight) hours.   insulin aspart 100 UNIT/ML injection Commonly known as: novoLOG 0-9 Units, Subcutaneous, 3 times daily with meals CBG < 70: Implement Hypoglycemia  measures CBG 70 - 120: 0 units CBG 121 - 150: 1 unit CBG 151 - 200: 2 units CBG 201 - 250: 3 units CBG 251 - 300: 5 units CBG 301 - 350: 7 units CBG 351 - 400: 9 units CBG > 400: call MD   Lantus SoloStar 100 UNIT/ML Solostar Pen Generic drug: insulin glargine Inject 5 Units into the skin at bedtime. What changed: how much to take   Magnesium-Chelated Zinc 133.33-5 MG Tabs Take 1 tablet by mouth daily.   oxyCODONE 5 MG immediate release tablet Commonly known as: Oxy IR/ROXICODONE Take 1 tablet (5 mg total) by mouth every 4 (four) hours as needed for severe pain.   pantoprazole 40 MG tablet Commonly known as: PROTONIX Take 40 mg by mouth 2 (two) times daily.   predniSONE 10 MG tablet Commonly known as: DELTASONE Take 1 tablet (10 mg total) by mouth daily with breakfast. What changed:   medication strength  how much to take   rOPINIRole 3 MG tablet Commonly known as: REQUIP Take 3 mg by mouth at bedtime.   sertraline 100 MG tablet Commonly known as: ZOLOFT Take 100 mg by mouth daily.   sodium bicarbonate 650 MG tablet Take 1,300 mg by mouth 2 (two) times daily.   sodium zirconium cyclosilicate 10 g Pack packet Commonly known as: LOKELMA Take 10 g by mouth 2 (two) times daily. Start taking on: September 02, 2020   sulfamethoxazole-trimethoprim 400-80 MG tablet Commonly known as: BACTRIM Take 1 tablet by mouth 3 (three) times a week.   tacrolimus 1 MG capsule Commonly known as: PROGRAF Take 1  capsule (1 mg total) by mouth in the morning. What changed: when to take this   tacrolimus 0.5 MG capsule Commonly known as: PROGRAF Take 1 capsule (0.5 mg total) by mouth at bedtime. What changed: You were already taking a medication with the same name, and this prescription was added. Make sure you understand how and when to take each.   Vitamin D (Ergocalciferol) 1.25 MG (50000 UNIT) Caps capsule Commonly known as: DRISDOL Take 50,000 Units by mouth once a week.       Contact information for follow-up providers    Dixie Dials, MD. Schedule an appointment as soon as possible for a visit in 4 week(s).   Specialty: Cardiology Contact information: [place] [address] [phone]        [name], MD. Schedule an appointment as soon as possible for a visit in 1 week(s).   Specialty: Internal Medicine Contact information: [place] [address] [phone]        Carlyle Basques, MD. Schedule an appointment as soon as possible for a visit in 3 week(s).  Specialty: Infectious Diseases Contact information: [place] [address] [phone]        Adegoroye, Wynona Luna, MD. Schedule an appointment as soon as possible for a visit in 2 week(s).   Specialty: Specialist Contact information: [address] [place] [address] [phone]            Contact information for after-discharge care    Destination    [facility] .   Service: Skilled Nursing Contact information: [place] [address] [phone]                 Allergies  Allergen Reactions  . Penicillins Hives and Other (See Comments)  . Tramadol Other (See Comments) and Palpitations    Patient reports it makes her feel spaced out Patient reports it makes her feel spaced out Patient reports it makes her feel spaced out Patient reports it makes  her feel spaced out   . Donepezil Diarrhea and Other (See Comments)  . Amlodipine Swelling    dizziness      Other Procedures/Studies: CT ABDOMEN PELVIS WO CONTRAST  Result Date: 08/24/2020 CLINICAL DATA:  Infectious gastroenteritis, ascending colitis EXAM: CT ABDOMEN AND PELVIS WITHOUT CONTRAST TECHNIQUE: Multidetector CT imaging of the abdomen and pelvis was performed following the standard protocol without IV contrast. COMPARISON:  None. FINDINGS: Lower chest: Small bilateral pleural effusions are present with mild associated bibasilar compressive atelectasis. Large hiatal hernia. Hypoattenuation of the cardiac blood pool is in keeping with at least mild anemia. Global cardiac size within normal limits. Hepatobiliary: No focal liver abnormality is seen. No gallstones, gallbladder wall thickening, or biliary dilatation. Pancreas: Unremarkable Spleen: Borderline splenomegaly. No intrasplenic lesions identified. The splenic vein appears unremarkable on this noncontrast examination. Small splenule seen adjacent to the inferior pole of the spleen. Adrenals/Urinary Tract: The adrenal glands are unremarkable. The kidneys are enlarged and contain innumerable cortical cysts in keeping with autosomal dominant polycystic kidney disease. Numerous cysts demonstrate relative hyperdensity likely related to proteinaceous or hemorrhagic cysts. Several small nonobstructing calculi are noted within the lower pole of the right kidney measuring up to 4 mm. No hydronephrosis. No ureteral calculi. The bladder is unremarkable. Stomach/Bowel: Stomach, small bowel, and large bowel are unremarkable save for mild sigmoid diverticulosis. The appendix is absent. An endoscopic clip is seen within the lumen of the cecum. Mild ascites. No free intraperitoneal gas Vascular/Lymphatic: No significant vascular findings are present. No enlarged abdominal or pelvic lymph nodes. Reproductive: Uterus and bilateral adnexa are unremarkable.  Other: No abdominal wall hernia. Rectum unremarkable. There is moderate bilateral subcutaneous edema within the flanks and visualized lower extremities in keeping with at least mild anasarca. Musculoskeletal: Remote right inferior pubic ramus fracture. No acute bone abnormality. No lytic or blastic bone lesion. Degenerative changes are seen within the lumbar spine with grade 1 anterolisthesis of L5 upon S1. IMPRESSION: Endoscopic clip noted within the lumen of the cecum. The bowel, and specifically the colon, however, appears unremarkable and there is no evidence of perforation or focal inflammation. Autosomal dominant polycystic kidney disease. Minimal right nonobstructing nephrolithiasis. No hydronephrosis. No urolithiasis. Mild anasarca with small bilateral pleural effusions, mild ascites, and subcutaneous body wall edema. Electronically Signed   By: Fidela Salisbury MD   On: 08/24/2020 05:35   MR HAND RIGHT WO CONTRAST  Result Date: 08/20/2020 CLINICAL DATA:  Right hand and wrist pain.  Recent falls. EXAM: MRI OF THE RIGHT HAND  WITHOUT CONTRAST; MRI OF THE RIGHT WRIST WITHOUT CONTRAST TECHNIQUE: Multiplanar, multisequence MR imaging of the right hand and wrist was performed. No intravenous contrast was administered. COMPARISON:  Right hand x-rays dated August 01, 2020. FINDINGS: Ligaments: Chronic complete tear of the scapholunate ligament with widening of the scapholunate interval. The lunotriquetral ligament is intact. Triangular fibrocartilage: There is a small partial tear of the articular disc ulnar surface. The foveal component of the triangular ligament is completely torn. Slight ulnar minus variance. Tendons: Flexor and extensor tendons are intact. Large amount of fluid in the flexor tendon sheaths. Mild extensor carpi ulnaris tendinosis with small amount of fluid in the tendon sheath. Carpal tunnel/median nerve: Unremarkable. Guyon's canal: Unremarkable. Joint/cartilage: Scattered cartilage loss about  the wrist with large areas of full-thickness cartilage loss in the radioscaphoid and scaphotrapeziotrapezoid joints. Scattered small carpal bone erosions. Additional small erosions of the second and third MCP joints with synovial thickening. Large radiocarpal and midcarpal joint effusions.Small distal radioulnar joint effusion. 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 Moderate first and third MCP joint osteoarthritis. Moderate to severe DIP joint osteoarthritis. Bones/carpal alignment: Chronic widening of the scapholunate interval. Early proximal migration of the capitate.No suspicious marrow signal abnormality. No acute fracture or dislocation. Other: Severe soft tissue swelling of the wrist and hand. No fluid collection. IMPRESSION: 1. No acute abnormality.  No fracture or evidence of osteomyelitis. 2. 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 3. Tears of the TFCC articular disc and foveal component of the triangular ligament. 4. Early SLAC wrist. 5. 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 6. Severe soft tissue swelling of the wrist and hand. No fluid collection. Electronically Signed   By: Titus Dubin M.D.   On: 08/20/2020 09:24   DG Hand 2 View Left  Result Date: 08/19/2020 CLINICAL DATA:  Pain EXAM: LEFT HAND - 2 VIEW COMPARISON:  None. FINDINGS: Frontal and lateral views obtained. There has been previous apparent removal of the trapezium bone. Postoperative changes noted in the proximal second metacarpal and trapezoid bones. Minimal calcification is noted in the expected location of the trapezium bone. No acute fracture or dislocation. Bones are osteoporotic. There is marked narrowing of all D IP joints with spurring  in all of these joints. There is moderate narrowing in the MCP and PIP joints. No erosion or periostitis evident. Bones are somewhat osteopenic IMPRESSION: 1. Apparent previous removal of the trapezium bone with a few tiny calcifications in this area. 2. 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 No erosions. 3.  No acute fracture or dislocation. 4.  Bones osteoporotic. Electronically Signed   By: Lowella Grip III M.D.   On: 08/19/2020 13:53   MR WRIST RIGHT WO CONTRAST  Result Date: 08/20/2020 CLINICAL DATA:  Right hand and wrist pain.  Recent falls. EXAM: MRI OF THE RIGHT HAND WITHOUT CONTRAST; MRI OF THE RIGHT WRIST WITHOUT CONTRAST TECHNIQUE: Multiplanar, multisequence MR imaging of the right hand and wrist was performed. No intravenous contrast was administered. COMPARISON:  Right hand x-rays dated August 01, 2020. FINDINGS: Ligaments: Chronic complete tear of the scapholunate ligament with widening of the scapholunate interval. The lunotriquetral ligament is intact. Triangular fibrocartilage: There is a small partial tear of the articular disc ulnar surface. The foveal component of the  triangular ligament is completely torn. Slight ulnar minus variance. Tendons: Flexor and extensor tendons are intact. Large amount of fluid in the flexor tendon sheaths. Mild extensor carpi ulnaris tendinosis with small amount of fluid in the tendon sheath. Carpal tunnel/median nerve: Unremarkable. Guyon's canal: Unremarkable. Joint/cartilage: Scattered cartilage loss about the wrist with large areas of full-thickness cartilage loss in the radioscaphoid and scaphotrapeziotrapezoid joints. Scattered small carpal bone erosions. Additional small erosions of the second and third MCP joints with synovial thickening. Large radiocarpal and midcarpal joint effusions.Small distal radioulnar joint effusion. 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 Moderate first and third MCP joint osteoarthritis.  Moderate to severe DIP joint osteoarthritis. Bones/carpal alignment: Chronic widening of the scapholunate interval. Early proximal migration of the capitate.No suspicious marrow signal abnormality. No acute fracture or dislocation. Other: Severe soft tissue swelling of the wrist and hand. No fluid collection. IMPRESSION: 1. No acute abnormality.  No fracture or evidence of osteomyelitis. 2. 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 3. Tears of the TFCC articular disc and foveal component of the triangular ligament. 4. Early SLAC wrist. 5. 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 6. Severe soft tissue swelling of the wrist and hand. No fluid collection. Electronically Signed   By: Titus Dubin M.D.   On: 08/20/2020 09:24   IR Fluoro Guide CV Line Right  Result Date: 08/22/2020 INDICATION: 75 year old female referred for tunneled cuffed central venous catheter EXAM: IMAGE GUIDED PLACEMENT OF TUNNELED CUFFED CENTRAL VENOUS CATHETER MEDICATIONS: None ANESTHESIA/SEDATION: None FLUOROSCOPY TIME:  Fluoroscopy Time: 0 minutes 6 seconds (0 mGy). COMPLICATIONS: None PROCEDURE: Informed written consent was obtained from the patient after a thorough discussion of the procedural risks, benefits and alternatives. All questions were addressed. Maximal Sterile Barrier Technique was utilized including caps, mask, sterile gowns, sterile gloves, sterile drape, hand hygiene and skin antiseptic. A timeout was performed prior to the initiation of the procedure. After written informed consent was obtained, patient was placed in the supine position on angiographic table. Patency of the right internal jugular vein was confirmed with ultrasound with image  documentation. Patient was prepped and draped in the usual sterile fashion including the right neck and right superior chest. Using ultrasound guidance, the skin and subcutaneous tissues overlying the right internal jugular vein were generously infiltrated with 1% lidocaine without epinephrine. Using ultrasound guidance, the right internal jugular vein was punctured with a micropuncture needle, and an 018 wire was advanced into the right heart confirming venous access. A small stab incision was made with an 11 blade scalpel. Peel-away sheath was placed over the wire, and then the wire was removed, marking the wire for estimation of internal catheter length. The chest wall was then generously infiltrated with 1% lidocaine for local anesthesia along the tissue tract. Small stab incision was made with 11 blade scalpel, and then the catheter was back tunneled to the puncture site at the right internal jugular vein. Catheter was pulled through the tract, with the catheter amputated at 18 cm. Catheter was advanced through the peel-away sheath, and the peel-away sheath was removed. Final image was stored. The catheter was anchored to the chest wall with 2 retention sutures, and Derma bond was used to seal the right internal jugular vein incision site and at the right chest wall. Patient  tolerated the procedure well and remained hemodynamically stable throughout. No complications were encountered and no significant blood loss was encountered. IMPRESSION: Status post placement of right IJ tunneled cuffed central venous catheter. Signed, Dulcy Fanny. Dellia Nims, RPVI [facility] Radiology Electronically Signed   By: Corrie Mckusick D.O.   On: 08/22/2020 11:51   IR US Guide Vasc Access Right  Result Date: 08/22/2020 INDICATION: 75 year old female referred for tunneled cuffed central venous catheter EXAM: IMAGE GUIDED PLACEMENT OF TUNNELED CUFFED CENTRAL VENOUS CATHETER  MEDICATIONS: None ANESTHESIA/SEDATION: None FLUOROSCOPY TIME:  Fluoroscopy Time: 0 minutes 6 seconds (0 mGy). COMPLICATIONS: None PROCEDURE: Informed written consent was obtained from the patient after a thorough discussion of the procedural risks, benefits and alternatives. All questions were addressed. Maximal Sterile Barrier Technique was utilized including caps, mask, sterile gowns, sterile gloves, sterile drape, hand hygiene and skin antiseptic. A timeout was performed prior to the initiation of the procedure. After written informed consent was obtained, patient was placed in the supine position on angiographic table. Patency of the right internal jugular vein was confirmed with ultrasound with image documentation. Patient was prepped and draped in the usual sterile fashion including the right neck and right superior chest. Using ultrasound guidance, the skin and subcutaneous tissues overlying the right internal jugular vein were generously infiltrated with 1% lidocaine without epinephrine. Using ultrasound guidance, the right internal jugular vein was punctured with a micropuncture needle, and an 018 wire was advanced into the right heart confirming venous access. A small stab incision was made with an 11 blade scalpel. Peel-away sheath was placed over the wire, and then the wire was removed, marking the wire for estimation of internal catheter length. The chest wall was then generously infiltrated with 1% lidocaine for local anesthesia along the tissue tract. Small stab incision was made with 11 blade scalpel, and then the catheter was back tunneled to the puncture site at the right internal jugular vein. Catheter was pulled through the tract, with the catheter amputated at 18 cm. Catheter was advanced through the peel-away sheath, and the peel-away sheath was removed. Final image was stored. The catheter was anchored to the chest wall with 2 retention sutures, and Derma bond was used to seal the right  internal jugular vein incision site and at the right chest wall. Patient tolerated the procedure well and remained hemodynamically stable throughout. No complications were encountered and no significant blood loss was encountered. IMPRESSION: Status post placement of right IJ tunneled cuffed central venous catheter. Signed, Dulcy Fanny. Dellia Nims, RPVI [facility] Electronically Signed   By: Corrie Mckusick D.O.   On: 08/22/2020 11:51   DG Chest Port 1 View  Result Date: 08/17/2020 CLINICAL DATA:  Dyspnea. EXAM: PORTABLE CHEST 1 VIEW COMPARISON:  August 15, 2020. FINDINGS: Stable cardiomediastinal silhouette. No pneumothorax or pleural effusion is noted. No consolidative process is noted. Bony thorax is unremarkable. IMPRESSION: No active disease. Electronically Signed   By: Marijo Conception M.D.   On: 08/17/2020 08:46   ECHOCARDIOGRAM COMPLETE  Result Date: 08/19/2020    ECHOCARDIOGRAM REPORT   Patient Name:   SAKURA OTEY [facility] Date of Exam: 08/19/2020 Medical Rec #:  [id number]              Height:       61.0 in Accession #:    [id number]             Weight:  126.9 lb Date of Birth:  Sep 13, 1945              BSA:          1.557 m Patient Age:    50 years               BP:           135/85 mmHg Patient Gender: F                      HR:           74 bpm. Exam Location:  Inpatient Procedure: 2D Echo, Cardiac Doppler and Color Doppler Indications:     Candidemia  History:         Patient has no prior history of Echocardiogram examinations.                  Risk Factors:Hypertension and Diabetes.  Sonographer:     [place], [place], RVT, RDCS Referring Phys:  [id number] [facility] Diagnosing Phys: Oswaldo Milian MD  Sonographer Comments: Restricted mobility; unable to turn patient due to shoulder injury. IMPRESSIONS  1. Left ventricular ejection fraction, by estimation, is 70 to 75%. The left ventricle has hyperdynamic function. The  left ventricle has no regional wall motion abnormalities. Left ventricular diastolic parameters are consistent with Grade II diastolic dysfunction (pseudonormalization). Elevated left atrial pressure. Hyperdynamic LV function with mid cavitary/LVOT gradient up to 23 mmHg  2. Right ventricular systolic function is normal. The right ventricular size is normal. There is mildly elevated pulmonary artery systolic pressure. The estimated right ventricular systolic pressure is 0000000 mmHg.  3. Right atrial size was mildly dilated.  4. The mitral valve is normal in structure. No evidence of mitral valve regurgitation.  5. The aortic valve was not well visualized. Aortic valve regurgitation is trivial. No aortic stenosis is present.  6. The inferior vena cava is normal in size with greater than 50% respiratory variability, suggesting right atrial pressure of 3 mmHg. Conclusion(s)/Recommendation(s): No vegetation seen, but technically difficult study. If high clinical suspicion for endocarditis, recommend TEE. FINDINGS  Left Ventricle: Left ventricular ejection fraction, by estimation, is 70 to 75%. The left ventricle has hyperdynamic function. The left ventricle has no regional wall motion abnormalities. The left ventricular internal cavity size was small. There is no  left ventricular hypertrophy. Left ventricular diastolic parameters are consistent with Grade II diastolic dysfunction (pseudonormalization). Elevated left atrial pressure. Right Ventricle: The right ventricular size is normal. No increase in right ventricular wall thickness. Right ventricular systolic function is normal. There is mildly elevated pulmonary artery systolic pressure. The tricuspid regurgitant velocity is 2.88  m/s, and with an assumed right atrial pressure of 3 mmHg, the estimated right ventricular systolic pressure is 0000000 mmHg. Left Atrium: Left atrial size was normal in size. Right Atrium: Right atrial size was mildly dilated. Pericardium:  There is no evidence of pericardial effusion. Mitral Valve: The mitral valve is normal in structure. No evidence of mitral valve regurgitation. MV peak gradient, 17.8 mmHg. The mean mitral valve gradient is 5.0 mmHg. Tricuspid Valve: The tricuspid valve is normal in structure. Tricuspid valve regurgitation is trivial. Aortic Valve: The aortic valve was not well visualized. Aortic valve regurgitation is trivial. No aortic stenosis is present. Pulmonic Valve: The pulmonic valve was not well visualized. Pulmonic valve regurgitation is not visualized. Aorta: The aortic root is normal in size and structure. Venous: The inferior vena cava is normal in  size with greater than 50% respiratory variability, suggesting right atrial pressure of 3 mmHg. IAS/Shunts: The interatrial septum was not well visualized.  LEFT VENTRICLE PLAX 2D LVIDd:         3.20 cm  Diastology LVIDs:         2.20 cm  LV e' medial:    3.70 cm/s LV PW:         0.90 cm  LV E/e' medial:  26.3 LV IVS:        0.70 cm  LV e' lateral:   4.03 cm/s LVOT diam:     1.30 cm  LV E/e' lateral: 24.2 LV SV:         44 LV SV Index:   28 LVOT Area:     1.33 cm  RIGHT VENTRICLE RV S prime:     13.30 cm/s TAPSE (M-mode): 1.6 cm LEFT ATRIUM             Index       RIGHT ATRIUM           Index LA diam:        3.50 cm 2.25 cm/m  RA Area:     16.70 cm LA Vol (A2C):   29.5 ml 18.95 ml/m RA Volume:   43.60 ml  28.01 ml/m LA Vol (A4C):   39.1 ml 25.12 ml/m LA Biplane Vol: 33.4 ml 21.46 ml/m  AORTIC VALVE LVOT Vmax:   194.00 cm/s LVOT Vmean:  146.000 cm/s LVOT VTI:    0.333 m  AORTA Ao Root diam: 2.70 cm MITRAL VALVE                TRICUSPID VALVE MV Area (PHT): 2.21 cm     TR Peak grad:   33.2 mmHg MV Area VTI:   0.71 cm     TR Vmax:        288.00 cm/s MV Peak grad:  17.8 mmHg MV Mean grad:  5.0 mmHg     SHUNTS MV Vmax:       2.11 m/s     Systemic VTI:  0.33 m MV Vmean:      105.0 cm/s   Systemic Diam: 1.30 cm MV Decel Time: 343 msec MV E velocity: 97.40 cm/s MV A  velocity: 178.00 cm/s MV E/A ratio:  0.55 Oswaldo Milian MD Electronically signed by Oswaldo Milian MD Signature Date/Time: 08/19/2020/9:03:51 PM    Final (Updated)    ECHO TEE  Result Date: 08/24/2020    TRANSESOPHOGEAL ECHO REPORT   Patient Name:   AZALEIA ARRELLANO [facility] Date of Exam: 08/24/2020 Medical Rec #:  [id number]              Height:       61.0 in Accession #:    [id number]             Weight:       127.9 lb Date of Birth:  19-Apr-1946              BSA:          1.562 m Patient Age:    55 years               BP:           159/96 mmHg Patient Gender: F                      HR:           68 bpm. Exam  Location:  Inpatient Procedure: Transesophageal Echo, Cardiac Doppler, Color Doppler and Saline            Contrast Bubble Study Indications:     Bacteremia  History:         Patient has prior history of Echocardiogram examinations, most                  recent 08/19/2020. Signs/Symptoms:Bacteremia; Risk                  Factors:Diabetes, Hypertension and Dyslipidemia. PNA, COVID+,                  kidney transplant.  Sonographer:     Dustin Flock RDCS Referring Phys:  [place] Diagnosing Phys: Dixie Dials MD PROCEDURE: The transesophogeal probe was passed without difficulty through the esophogus of the patient. Sedation performed by different physician. The patient was monitored while under deep sedation. Anesthestetic sedation was provided intravenously by Anesthesiology: 125.'28mg'$  of Propofol. Image quality was good. The patient developed no complications during the procedure. IMPRESSIONS  1. Left ventricular ejection fraction, by estimation, is 55 to 60%. The left ventricle has normal function. The left ventricle has no regional wall motion abnormalities.  2. Right ventricular systolic function is normal. The right ventricular size is normal.  3. Left atrial size was moderately dilated. No left atrial/left atrial appendage thrombus was detected.  4. Possible large linear clot.  5. The  pericardial effusion is circumferential. There is no evidence of cardiac tamponade.  6. Multiple small vegetation on the mitral valve.  7. The mitral valve is degenerative. Mild mitral valve regurgitation.  8. Aortic valve vegetation is visualized on the left.  9. The aortic valve is tricuspid. There is moderate calcification of the aortic valve. There is mild thickening of the aortic valve. Aortic valve regurgitation is mild. Mild to moderate aortic valve sclerosis/calcification is present, without any evidence of aortic stenosis. 10. There is mild (Grade II) atheroma plaque involving the aortic root, ascending aorta and descending aorta. 11. The inferior vena cava is normal in size with <50% respiratory variability, suggesting right atrial pressure of 8 mmHg. 12. Agitated saline contrast bubble study was positive with shunting observed after >6 cardiac cycles suggestive of intrapulmonary shunting. Conclusion(s)/Recommendation(s): Findings are concerning for vegetation/infective endocarditis as detailed above. FINDINGS  Left Ventricle: Left ventricular ejection fraction, by estimation, is 55 to 60%. The left ventricle has normal function. The left ventricle has no regional wall motion abnormalities. The left ventricular internal cavity size was normal in size. Right Ventricle: The right ventricular size is normal. No increase in right ventricular wall thickness. Right ventricular systolic function is normal. Left Atrium: Left atrial size was moderately dilated. Spontaneous echo contrast was present in the left atrium. No left atrial/left atrial appendage thrombus was detected. Right Atrium: Right atrial size was normal in size. Prominent Eustachian valve and Possible large linear clot. Pericardium: Trivial pericardial effusion is present. The pericardial effusion is circumferential. There is no evidence of cardiac tamponade. Mitral Valve: The mitral valve is degenerative in appearance. There is mild thickening of  the mitral valve leaflet(s). There is mild calcification of the mitral valve leaflet(s). Normal mobility of the mitral valve leaflets. Mild to moderate mitral annular calcification. A multiple small vegetation is seen on the anterior and posterior mitral leaflet. Mild mitral valve regurgitation. Tricuspid Valve: Small, mobile, multiple TV vegetation seen. The tricuspid valve is normal in structure. Tricuspid valve regurgitation is mild. Aortic Valve: The  aortic valve is tricuspid. There is moderate calcification of the aortic valve. There is mild thickening of the aortic valve. There is moderate aortic valve annular calcification. Aortic valve regurgitation is mild. Mild to moderate aortic valve sclerosis/calcification is present, without any evidence of aortic stenosis. A mobile vegetation is seen on the left. Pulmonic Valve: The pulmonic valve was normal in structure. Pulmonic valve regurgitation is not visualized. Aorta: The aortic root is normal in size and structure. There is mild (Grade II) atheroma plaque involving the aortic root, ascending aorta and descending aorta. Venous: The left upper pulmonary vein, left lower pulmonary vein, right upper pulmonary vein and right lower pulmonary vein are normal. The inferior vena cava is normal in size with less than 50% respiratory variability, suggesting right atrial pressure of 8 mmHg. IAS/Shunts: The atrial septum is grossly normal. Agitated saline contrast was given intravenously to evaluate for intracardiac shunting. Agitated saline contrast bubble study was positive with shunting observed after >6 cardiac cycles suggestive of intrapulmonary shunting. There is no evidence of a patent foramen ovale. No ventricular septal defect is seen or detected. There is no evidence of an atrial septal defect. Additional Comments: There is a small pleural effusion in both left and right lateral regions. Dixie Dials MD Electronically signed by Dixie Dials MD Signature  Date/Time: 08/24/2020/5:54:46 PM    Final      TODAY-DAY OF DISCHARGE:  Subjective:   Ruth Riley today has no headache,no chest abdominal pain,no new weakness tingling or numbness, feels much better wants to go home today.   Objective:   Blood pressure 138/79, pulse 80, temperature 98.1 F (36.7 C), temperature source Oral, resp. rate 16, height '5\' 1"'$  (1.549 m), weight 58 kg, SpO2 95 %.  Intake/Output Summary (Last 24 hours) at 09/01/2020 1216 Last data filed at 09/01/2020 0500 Gross per 24 hour  Intake 240 ml  Output --  Net 240 ml   Filed Weights   08/20/20 0339 08/21/20 0347 08/24/20 1205  Weight: 57 kg 58 kg 58 kg    Exam: Awake Alert, Oriented *3, No new F.N deficits, Normal affect [place].AT,PERRAL Supple Neck,No JVD, No cervical lymphadenopathy appriciated.  Symmetrical Chest wall movement, Good air movement bilaterally, CTAB RRR,No Gallops,Rubs or new Murmurs, No Parasternal Heave +ve B.Sounds, Abd Soft, Non tender, No organomegaly appriciated, No rebound -guarding or rigidity. No Cyanosis, Clubbing or edema, No new Rash or bruise   PERTINENT RADIOLOGIC STUDIES: No results found.   PERTINENT LAB RESULTS: CBC: Recent Labs    08/30/20 0808 08/31/20 0235  WBC 4.4 5.1  HGB 7.7* 8.2*  HCT 25.1* 26.8*  PLT 221 259   CMET CMP     Component Value Date/Time   NA 136 09/01/2020 0407   NA 138 07/20/2020 0000   K 5.4 (H) 09/01/2020 0407   CL 107 09/01/2020 0407   CO2 24 09/01/2020 0407   GLUCOSE 133 (H) 09/01/2020 0407   BUN 32 (H) 09/01/2020 0407   BUN 41 (A) 07/20/2020 0000   CREATININE 3.13 (H) 09/01/2020 0407   CALCIUM 8.2 (L) 09/01/2020 0407   PROT 4.6 (L) 09/01/2020 0407   ALBUMIN 1.6 (L) 09/01/2020 0407   AST 23 09/01/2020 0407   ALT 19 09/01/2020 0407   ALKPHOS 104 09/01/2020 0407   BILITOT 0.3 09/01/2020 0407   GFRNONAA 15 (L) 09/01/2020 0407    GFR Estimated Creatinine Clearance: 13.1 mL/min (A) (by C-G formula based on SCr of 3.13 mg/dL  (H)). No results for input(s): LIPASE, AMYLASE  in the last 72 hours. No results for input(s): CKTOTAL, CKMB, CKMBINDEX, TROPONINI in the last 72 hours. Invalid input(s): POCBNP No results for input(s): DDIMER in the last 72 hours. No results for input(s): HGBA1C in the last 72 hours. Recent Labs    09/01/20 0407  CHOL 112  HDL 51  LDLCALC 42  TRIG 96  CHOLHDL 2.2   No results for input(s): TSH, T4TOTAL, T3FREE, THYROIDAB in the last 72 hours.  Invalid input(s): FREET3 Recent Labs    08/31/20 0235 08/31/20 0236  FERRITIN 254  --   TIBC 160*  --   IRON 25*  --   RETICCTPCT  --  1.7   Coags: No results for input(s): INR in the last 72 hours.  Invalid input(s): PT Microbiology: No results found for this or any previous visit (from the past 240 hour(s)).  FURTHER DISCHARGE INSTRUCTIONS:  Get Medicines reviewed and adjusted: Please take all your medications with you for your next visit with your Primary MD  Laboratory/radiological data: Please request your Primary MD to go over all hospital tests and procedure/radiological results at the follow up, please ask your Primary MD to get all Hospital records sent to his/her office.  In some cases, they will be blood work, cultures and biopsy results pending at the time of your discharge. Please request that your primary care M.D. goes through all the records of your hospital data and follows up on these results.  Also Note the following: If you experience worsening of your admission symptoms, develop shortness of breath, life threatening emergency, suicidal or homicidal thoughts you must seek medical attention immediately by calling 911 or calling your MD immediately  if symptoms less severe.  You must read complete instructions/literature along with all the possible adverse reactions/side effects for all the Medicines you take and that have been prescribed to you. Take any new Medicines after you have completely understood and  accpet all the possible adverse reactions/side effects.   Do not drive when taking Pain medications or sleeping medications (Benzodaizepines)  Do not take more than prescribed Pain, Sleep and Anxiety Medications. It is not advisable to combine anxiety,sleep and pain medications without talking with your primary care practitioner  Special Instructions: If you have smoked or chewed Tobacco  in the last 2 yrs please stop smoking, stop any regular Alcohol  and or any Recreational drug use.  Wear Seat belts while driving.  Please note: You were cared for by a hospitalist during your hospital stay. Once you are discharged, your primary care physician will handle any further medical issues. Please note that NO REFILLS for any discharge medications will be authorized once you are discharged, as it is imperative that you return to your primary care physician (or establish a relationship with a primary care physician if you do not have one) for your post hospital discharge needs so that they can reassess your need for medications and monitor your lab values.  Total Time spent coordinating discharge including counseling, education and face to face time equals 35 minutes.  SignedOren Binet 09/01/2020 12:16 PM

## 2020-09-01 NOTE — Progress Notes (Signed)
Report was given to [facility] at [facility] at approx 1215.

## 2020-09-01 NOTE — Care Management Important Message (Signed)
Important Message  Patient Details  Name: Laprincess Mcshan MRN: [id number] Date of Birth: Jan 21, 1946   Medicare Important Message Given:  Yes - Important Message mailed due to current National Emergency   Verbal consent obtained due to current National Emergency  Relationship to patient: Self Contact Name: Brandalyn Call Date: 09/01/20  Time: 1442 Phone: [phone] Outcome: No Answer/Busy Important Message mailed to: Patient address on file    Delorse Lek 09/01/2020, 2:43 PM

## 2020-09-01 NOTE — Progress Notes (Signed)
Messaged Dr. Sloan Leiter and [facility] RN. Notified that this CVC is tunneled/cuffed and will need IR to pull line. Fran Lowes, RN VAST

## 2020-09-01 NOTE — Plan of Care (Signed)
Pt discharging to [facility].

## 2020-09-01 NOTE — Progress Notes (Addendum)
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  She has a history of renal transplant very complicated course [id number] secondary to polycystic kidney disease.  We were consulted for help with management of her chronic kidney disease as well as management of her immunosuppressive medications.  I discussed the case with Dr. Willene Hatchet with [facility] nephrology.  He knows the patient well.  There have been ongoing discussions about the need to transition to hemodialysis at some point.  In the setting of fungemia, it may be prudent to wait at least 6 to 8 weeks before placing any access.  Tacrolimus dose will need to be adjusted to maintain a trough of 3-5.  Blood pressure 159/86 pulse 83 temperature 98.1 O2 sats 93% room air.  Urine output 700 cc recorded 09/01/2020  Sodium 136 potassium 5.4 chloride 107 CO2 24 BUN 32 creatinine 3.13 glucose 133 calcium 8.2 albumin 1.6T sat 16% hemoglobin 8.2  Trough   tacrolimus pending 08/31/2020    Objective:  Vital signs in last 24 hours:  Temp:  [97.3 F (36.3 C)-98.9 F (37.2 C)] 98.1 F (36.7 C) (04/21 0746) Pulse Rate:  [75-83] 80 (04/21 0746) Resp:  [15-20] 16 (04/21 0746) BP: (105-158)/(67-98) 138/79 (04/21 0400) SpO2:  [92 %-98 %] 95 % (04/21 0746)  Weight change:  Filed Weights   08/20/20 0339 08/21/20 0347 08/24/20 1205  Weight: 57 kg 58 kg 58 kg    Intake/Output: I/O last 3 completed shifts: In: 240 [P.O.:240] Out: 700 [Urine:700]   Intake/Output this shift:  No intake/output data recorded.  General: Sleepy somnolent easy to arouse appropriate HEENT: Pupils round equal reactive oropharynx moist mucous membranes normal Eyes: Pupils are round equal and reactive no icterus however she did have some conjunctival pallor Neck: Supple no thyromegaly no adenopathy midline trachea JVP not elevated Heart: Soft heart sounds with faint  murmur throughout systole.  Rate and rhythm is regular Lungs: Clear to auscultation without wheezes or rales Abdomen: Soft nontender with no obvious organosplenomegaly Extremities: No cyanosis no clubbing onychogryphosis noted on fingernails.  She had a wrist brace on her right hand Skin: No ecchymosis or excoriations no rash Neuro: Grossly intact moving all 4 limbs.   Basic Metabolic Panel: Recent Labs  Lab 08/26/20 0047 08/28/20 0254 08/30/20 0808 08/31/20 0235 09/01/20 0407  NA 133* 134* 134* 133* 136  K 4.2 4.5 5.2* 5.0 5.4*  CL 108 106 106 104 107  CO2 18* '23 22 22 24  '$ GLUCOSE 119* 70 106* 68* 133*  BUN 36* 33* 29* 30* 32*  CREATININE 2.57* 2.78* 2.85* 2.99* 3.13*  CALCIUM 7.8* 8.0* 8.6* 8.4* 8.2*    Liver Function Tests: Recent Labs  Lab 09/01/20 0407  AST 23  ALT 19  ALKPHOS 104  BILITOT 0.3  PROT 4.6*  ALBUMIN 1.6*   No results for input(s): LIPASE, AMYLASE in the last 168 hours. No results for input(s): AMMONIA in the last 168 hours.  CBC: Recent Labs  Lab 08/28/20 0254 08/28/20 1707 08/29/20 0227 08/29/20 0449 08/30/20 0808 08/31/20 0235  WBC 5.5 5.2 4.2  --  4.4 5.1  HGB 7.6* 8.4* 7.8* 8.4* 7.7* 8.2*  HCT 24.3* 26.9* 25.2* 26.7* 25.1* 26.8*  MCV 89.0 89.1 89.0  --  89.3 90.5  PLT 199 214 208  --  221 259    Cardiac Enzymes: No results for input(s): CKTOTAL, CKMB, CKMBINDEX, TROPONINI in the last 168  hours.  BNP: Invalid input(s): POCBNP  CBG: Recent Labs  Lab 08/31/20 0745 08/31/20 1210 08/31/20 1629 08/31/20 2058 09/01/20 0745  GLUCAP 81 129* 163* 150* 101*    Microbiology: Results for orders placed or performed during the hospital encounter of 08/16/20  Culture, blood (routine x 2) Call MD if unable to obtain prior to antibiotics being given     Status: Abnormal (Preliminary result)   Collection Time: 08/16/20  3:50 PM   Specimen: BLOOD  Result Value Ref Range Status   Specimen Description BLOOD RIGHT ANTECUBITAL  Final    Special Requests   Final    BOTTLES DRAWN AEROBIC AND ANAEROBIC Blood Culture results may not be optimal due to an inadequate volume of blood received in culture bottles   Culture  Setup Time (A)  Final    YEAST AEROBIC BOTTLE ONLY CRITICAL RESULT CALLED TO, READ BACK BY AND VERIFIED WITH: PHARMD ABBOTT [id number] FCP    Culture (A)  Final    CANDIDA ALBICANS Sent to [place] for further susceptibility testing. Performed at [facility], [place] [address] [place], [place] [address]    Report Status PENDING  Incomplete  Blood Culture ID Panel (Reflexed)     Status: Abnormal   Collection Time: 08/16/20  3:50 PM  Result Value Ref Range Status   Enterococcus faecalis NOT DETECTED NOT DETECTED Final   Enterococcus Faecium NOT DETECTED NOT DETECTED Final   Listeria monocytogenes NOT DETECTED NOT DETECTED Final   Staphylococcus species NOT DETECTED NOT DETECTED Final   Staphylococcus aureus (BCID) NOT DETECTED NOT DETECTED Final   Staphylococcus epidermidis NOT DETECTED NOT DETECTED Final   Staphylococcus lugdunensis NOT DETECTED NOT DETECTED Final   Streptococcus species NOT DETECTED NOT DETECTED Final   Streptococcus agalactiae NOT DETECTED NOT DETECTED Final   Streptococcus pneumoniae NOT DETECTED NOT DETECTED Final   Streptococcus pyogenes NOT DETECTED NOT DETECTED Final   A.calcoaceticus-baumannii NOT DETECTED NOT DETECTED Final   Bacteroides fragilis NOT DETECTED NOT DETECTED Final   Enterobacterales NOT DETECTED NOT DETECTED Final   Enterobacter cloacae complex NOT DETECTED NOT DETECTED Final   Escherichia coli NOT DETECTED NOT DETECTED Final   Klebsiella aerogenes NOT DETECTED NOT DETECTED Final   Klebsiella oxytoca NOT DETECTED NOT DETECTED Final   Klebsiella pneumoniae NOT DETECTED NOT DETECTED Final   Proteus species NOT DETECTED NOT DETECTED Final   Salmonella species NOT DETECTED NOT DETECTED Final   Serratia marcescens NOT DETECTED NOT DETECTED Final   Haemophilus  influenzae NOT DETECTED NOT DETECTED Final   Neisseria meningitidis NOT DETECTED NOT DETECTED Final   Pseudomonas aeruginosa NOT DETECTED NOT DETECTED Final   Stenotrophomonas maltophilia NOT DETECTED NOT DETECTED Final   Candida albicans DETECTED (A) NOT DETECTED Final    Comment: CRITICAL RESULT CALLED TO, READ BACK BY AND VERIFIED WITH: PHARMD ABBOTT [id number] FCP    Candida auris NOT DETECTED NOT DETECTED Final   Candida glabrata NOT DETECTED NOT DETECTED Final   Candida krusei NOT DETECTED NOT DETECTED Final   Candida parapsilosis NOT DETECTED NOT DETECTED Final   Candida tropicalis NOT DETECTED NOT DETECTED Final   Cryptococcus neoformans/gattii NOT DETECTED NOT DETECTED Final    Comment: Performed at [facility], [address] [place], [place] [address]  Antifungal AST 9 Drug Panel     Status: None   Collection Time: 08/16/20  3:50 PM  Result Value Ref Range Status   Organism ID, Yeast Candida albicans  Corrected  Comment: (NOTE) Identification performed by account, not confirmed by this laboratory. CORRECTED ON 04/14 AT 1238: PREVIOUSLY REPORTED AS Preliminary report    Amphotericin B MIC 1.0 ug/mL  Final    Comment: (NOTE) *Breakpoints have been established for only some organism-drug combinations as indicated. Results of this test are labeled for research purposes only by the assay's manufacturer. The performance characteristics of this assay have not been established by the manufacturer. The result should not be used for treatment or for diagnostic purposes without confirmation of the diagnosis by another medically established diagnostic product or procedure. The performance characteristics were determined by [facility].    Please Note: PENDING  Incomplete   Anidulafungin MIC Comment  Final    Comment: (NOTE) 0.03 ug/mL Susceptible *Breakpoints have been established for only some organism-drug combinations as indicated. Results of this test are labeled  for research purposes only by the assay's manufacturer. The performance characteristics of this assay have not been established by the manufacturer. The result should not be used for treatment or for diagnostic purposes without confirmation of the diagnosis by another medically established diagnostic product or procedure. The performance characteristics were determined by [facility].    Caspofungin MIC Comment  Final    Comment: 0.06 ug/mL Susceptible   Micafungin MIC Comment  Final    Comment: 0.016 ug/mL Susceptible   Posaconazole MIC 0.03 ug/mL  Final    Comment: (NOTE) *Breakpoints have been established for only some organism-drug combinations as indicated. Results of this test are labeled for research purposes only by the assay's manufacturer. The performance characteristics of this assay have not been established by the manufacturer. The result should not be used for treatment or for diagnostic purposes without confirmation of the diagnosis by another medically established diagnostic product or procedure. The performance characteristics were determined by [facility].    Please Note: PENDING  Incomplete   Fluconazole Islt MIC Comment  Final    Comment: 0.25 ug/mL Susceptible   Flucytosine MIC 1.0 ug/mL  Final   Itraconazole MIC 0.06 ug/mL  Final   Voriconazole MIC Comment  Final    Comment: (NOTE) 0.008 ug/mL or less, Susceptible Performed At: [facility] [address] [place], [place] [id number] Rush Farmer MD [id number]    Source CANDIDA ALBICANS/ BLOOD  Final    Comment: Performed at [facility], [place] [address], [place], [place] [address]  Culture, blood (routine x 2) Call MD if unable to obtain prior to antibiotics being given     Status: Abnormal   Collection Time: 08/16/20  3:52 PM   Specimen: BLOOD LEFT ARM  Result Value Ref Range Status   Specimen Description BLOOD LEFT ARM  Final   Special Requests   Final    BOTTLES DRAWN AEROBIC AND  ANAEROBIC Blood Culture results may not be optimal due to an inadequate volume of blood received in culture bottles   Culture  Setup Time (A)  Final    YEAST AEROBIC BOTTLE ONLY CRITICAL VALUE NOTED.  VALUE IS CONSISTENT WITH PREVIOUSLY REPORTED AND CALLED VALUE. Performed at [facility], [place] [address] [place], [place] [address]    Culture CANDIDA ALBICANS (A)  Final   Report Status 08/21/2020 FINAL  Final  Culture, blood (routine x 2)     Status: None   Collection Time: 08/18/20  7:51 AM   Specimen: BLOOD  Result Value Ref Range Status   Specimen Description BLOOD RIGHT ANTECUBITAL  Final   Special Requests   Final  BOTTLES DRAWN AEROBIC AND ANAEROBIC Blood Culture adequate volume   Culture   Final    NO GROWTH 5 DAYS Performed at [facility], [place] [address], [place], [place] [address]    Report Status 08/23/2020 FINAL  Final  Culture, blood (routine x 2)     Status: None   Collection Time: 08/18/20  8:03 AM   Specimen: BLOOD LEFT WRIST  Result Value Ref Range Status   Specimen Description BLOOD LEFT WRIST  Final   Special Requests   Final    BOTTLES DRAWN AEROBIC AND ANAEROBIC Blood Culture adequate volume   Culture   Final    NO GROWTH 5 DAYS Performed at [facility], [place] [address], [place], [place] [address]    Report Status 08/23/2020 FINAL  Final    Coagulation Studies: No results for input(s): LABPROT, INR in the last 72 hours.  Urinalysis: No results for input(s): COLORURINE, LABSPEC, PHURINE, GLUCOSEU, HGBUR, BILIRUBINUR, KETONESUR, PROTEINUR, UROBILINOGEN, NITRITE, LEUKOCYTESUR in the last 72 hours.  Invalid input(s): APPERANCEUR    Imaging: No results found.   Medications:   . sodium chloride 10 mL/hr at 08/26/20 1842   . carvedilol  25 mg Oral BID  . Chlorhexidine Gluconate Cloth  6 each Topical Daily  . enoxaparin (LOVENOX) injection  30 mg Subcutaneous Q24H  . fluconazole  400 mg Oral Daily  . hydrALAZINE  50 mg Oral Q8H   . insulin aspart  0-9 Units Subcutaneous TID WC  . insulin glargine  5 Units Subcutaneous QHS  . lidocaine  1 patch Transdermal Q0600  . pantoprazole  40 mg Oral BID  . polyethylene glycol  17 g Oral BID  . predniSONE  10 mg Oral Q breakfast  . rOPINIRole  3 mg Oral QHS  . rosuvastatin  20 mg Oral Daily  . sertraline  100 mg Oral QHS  . sodium bicarbonate  1,300 mg Oral BID  . sodium chloride flush  10-40 mL Intracatheter Q12H  . sodium zirconium cyclosilicate  10 g Oral BID  . sulfamethoxazole-trimethoprim  1 tablet Oral Once per day on Mon Wed Fri  . tacrolimus  0.5 mg Oral QHS   And  . tacrolimus  1 mg Oral Daily   acetaminophen **OR** acetaminophen, albuterol, calcium carbonate (dosed in mg elemental calcium), camphor-menthol **AND** hydrOXYzine, docusate sodium, feeding supplement (NEPRO CARB STEADY), guaiFENesin, hydrALAZINE, HYDROcodone-acetaminophen, lidocaine, lip balm, ondansetron **OR** ondansetron (ZOFRAN) IV, oxyCODONE, sodium chloride, sodium chloride flush, sorbitol, zolpidem  Assessment/ Plan:  1.Chronic kidney disease stage IV.  Patient's renal function appears to be around about baseline.  The does not appear to be a whole lot of change from her renal function.  She is status post renal transplant.  There is no any evidence of obstruction.  She has some dominant polycystic kidney disease with numerous cysts of relatively hypodensity.  No hydronephrosis noted.  2. Hypertension/volume  -appears euvolemic and hypertensive currently on Coreg, hydralazine with reasonable control.  There are some hypertensive spikes noted.  Increasing the dose of hydralazine  appropriate 3. Anemia  -slightly iron deficient.  It may be advisable to avoid IV iron in setting of infection.  Transfusion may be the preferred mode of correction.  ESA has been added by primary service. 4.  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  There has  been an effort to reduce her maintenance immunosuppression.  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  She will need close follow-up with her transplant nephrologist.  Modification of her immunosuppressants may need to be undertaken.  At this present time she is just on low-dose prednisone and low-dose Prograf.  This has been discussed with her primary nephrologist Dr.Adegoroye 5.  Immunosuppressant medications: With the addition of Diflucan there may be an interaction between her and her Prograf leading to high levels of Prograf.  Prograf levels have been reduced.  I am fine with this at this point.  Patient has already been advised that she is losing her allograft. I would also check tacrolimus levels and renal function panels twice weekly.  This will need to be carefully communicated  to her primary nephrology team. 6.  Patient with mild hyperkalemia-we will treat with Lokelma 10 g 3 times daily x24 hours    LOS: [place] '@TODAY''@8'$ :23 AM   Sherril Croon 09/01/2020, 8:23 AM

## 2020-09-02 LAB — CULTURE, BLOOD (ROUTINE X 2)

## 2020-09-02 LAB — TACROLIMUS LEVEL: Tacrolimus (FK506) - LabCorp: 4.8 ng/mL (ref 2.0–20.0)

## 2020-09-05 ENCOUNTER — Inpatient Hospital Stay: Payer: Medicare Other

## 2020-09-30 ENCOUNTER — Ambulatory Visit: Payer: Medicare Other | Admitting: Internal Medicine

## 2022-02-19 IMAGING — MR MR WRIST*R* W/O CM
6 of 12 series · 18 of 40 positions shown · non-contrast
Comparison: Right hand x-rays dated August 01, 2020.

CLINICAL DATA: Right hand and wrist pain.  Recent falls.

EXAM:
MRI OF THE RIGHT HAND WITHOUT CONTRAST; MRI OF THE RIGHT WRIST
WITHOUT CONTRAST
TECHNIQUE: Multiplanar, multisequence MR imaging of the right hand and wrist
was performed. No intravenous contrast was administered.

[Series 4: T2 fat-sat · axial · 3.0mm · 0.23mm/px · z∈[-148,-69]mm · 4 of 23 slices shown (1 of 4)]
[im 1/23]
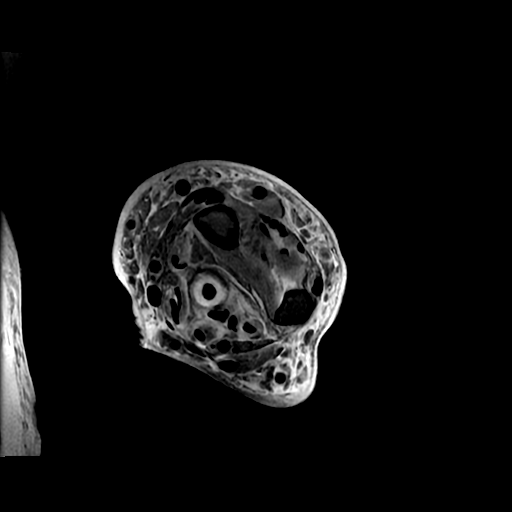
[im 8/23]
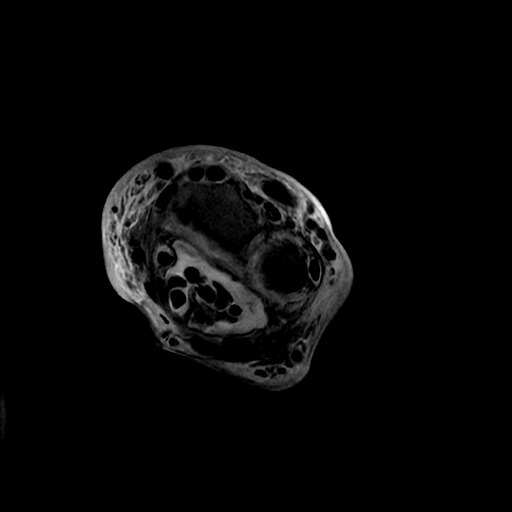
[im 15/23]
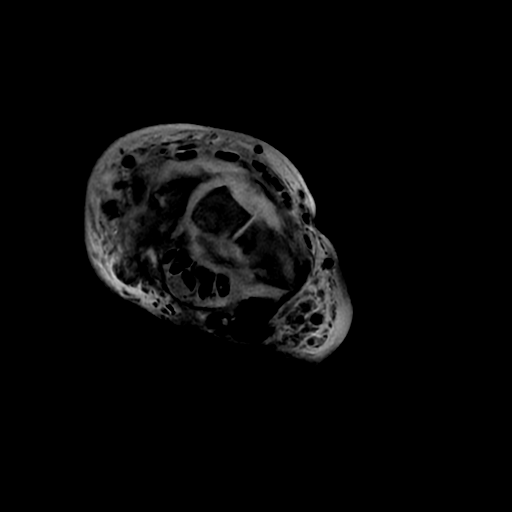
[im 23/23]
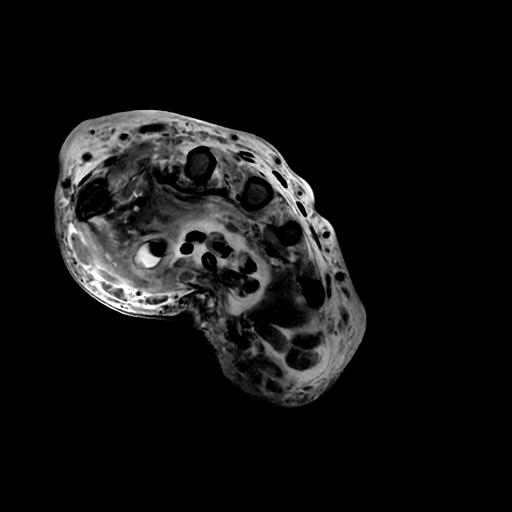

[Series 7: T2 fat-sat · oblique · 3.0mm · 0.25mm/px · 2 of 17 slices shown (2 of 4)]
[im 1/17]
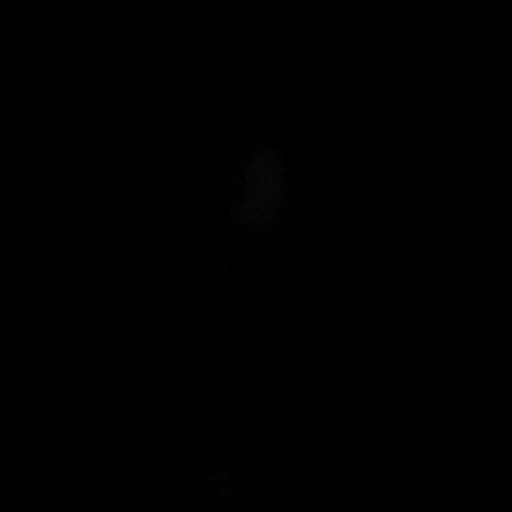
[im 17/17]
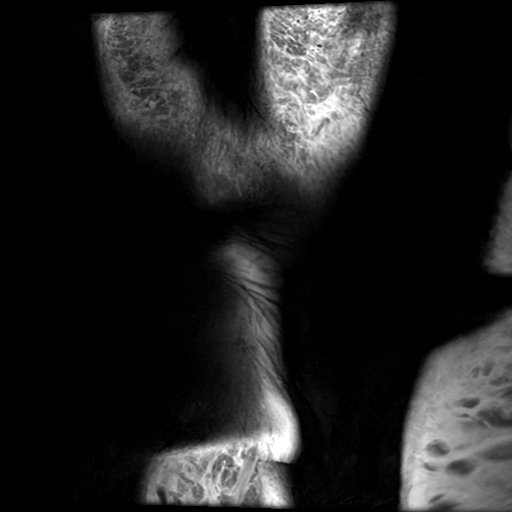

[Series 8: PD fat-sat · oblique · 3.0mm · 0.25mm/px · 2 of 17 slices shown (1 of 2)]
[im 1/17]
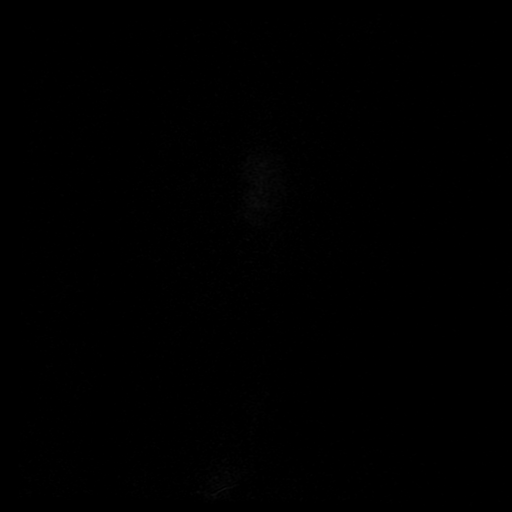
[im 17/17]
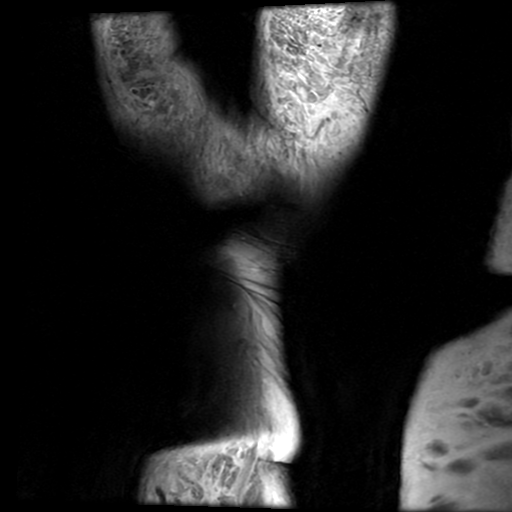

[Series 9: PD fat-sat · oblique · 2.5mm · 0.20mm/px · 3 of 25 slices shown (2 of 2)]
[im 1/25]
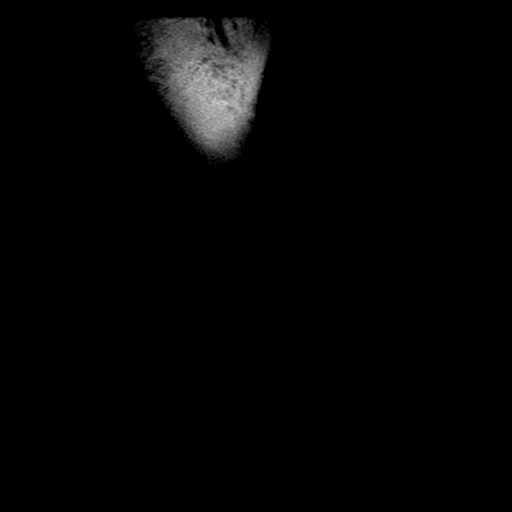
[im 13/25]
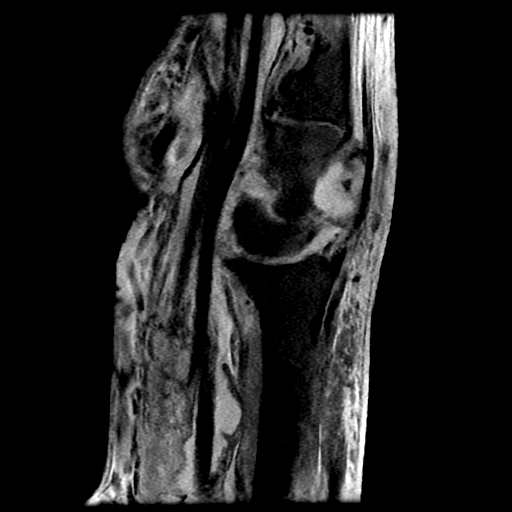
[im 25/25]
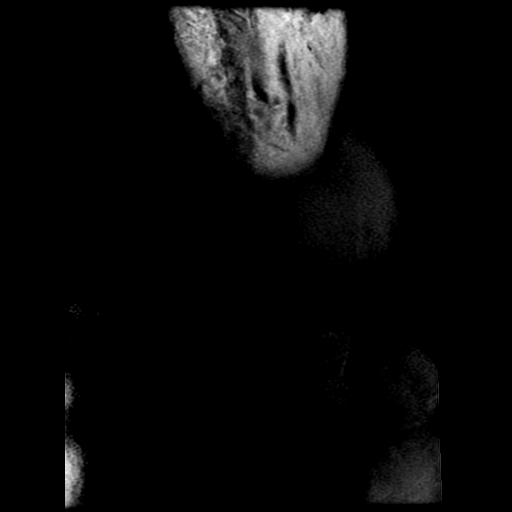

[Series 12: T2 fat-sat · axial · 2.5mm · 0.21mm/px · z∈[-85,+39]mm · 6 of 52 slices shown (3 of 4)]
[im 1/52]
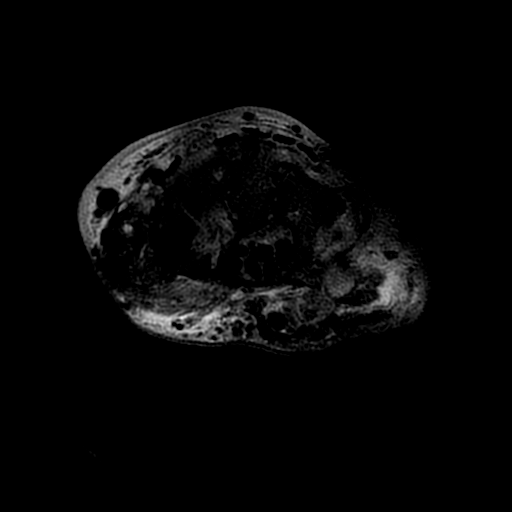
[im 11/52]
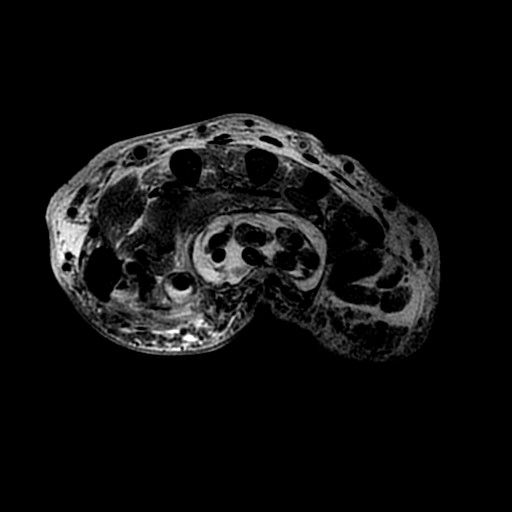
[im 21/52]
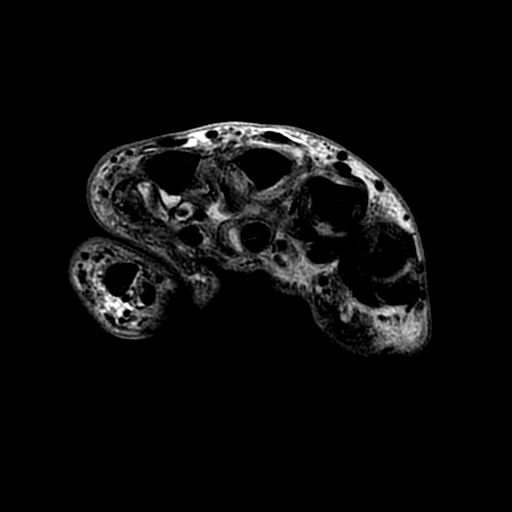
[im 31/52]
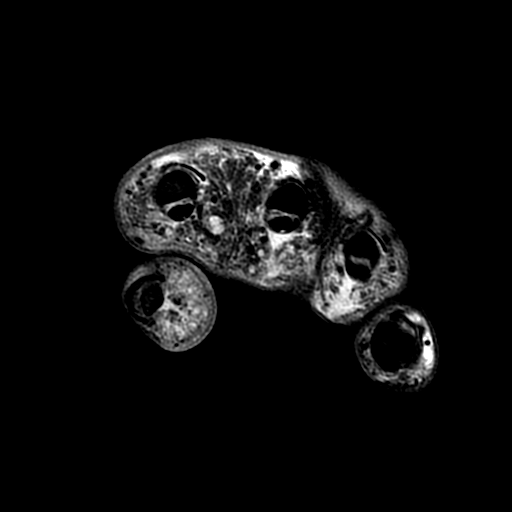
[im 41/52]
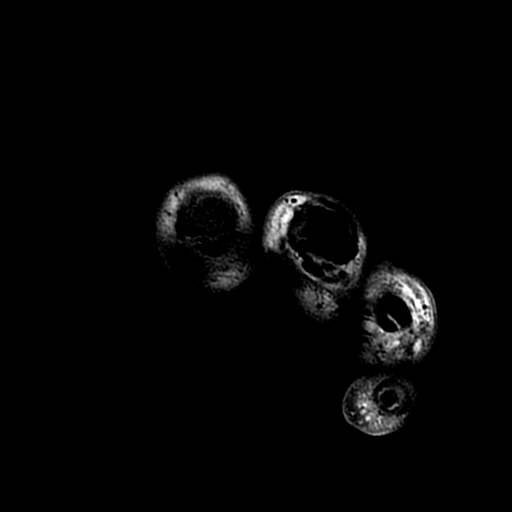
[im 52/52]
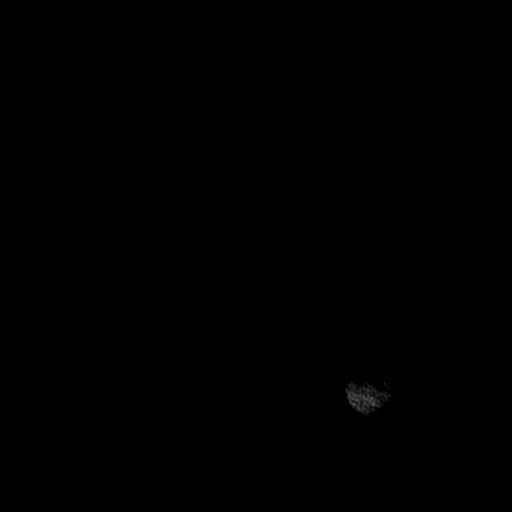

[Series 13: T2 fat-sat · oblique · 2.0mm · 0.25mm/px · 1 of 26 slices shown (4 of 4)]
[im 1/26]
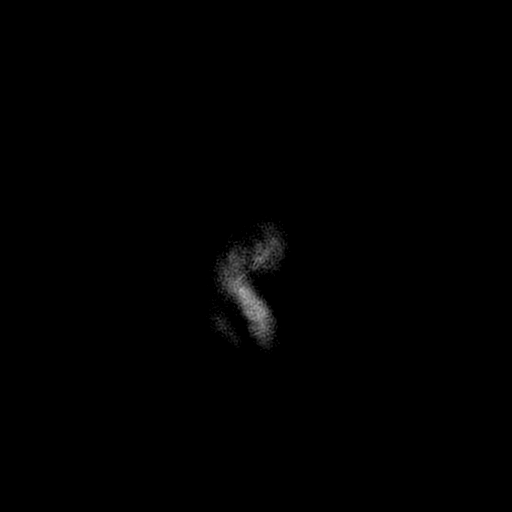

[18 of 40 positions shown; findings below may reference images not displayed]

FINDINGS: Ligaments: Chronic complete tear of the scapholunate ligament with
widening of the scapholunate interval. The lunotriquetral ligament
is intact.

Triangular fibrocartilage: There is a small partial tear of the
articular disc ulnar surface. The foveal component of the triangular
ligament is completely torn. Slight ulnar minus variance.

Tendons: Flexor and extensor tendons are intact. Large amount of
fluid in the flexor tendon sheaths. Mild extensor carpi ulnaris
tendinosis with small amount of fluid in the tendon sheath.

Carpal tunnel/median nerve: Unremarkable.

Guyon's canal: Unremarkable.

Joint/cartilage: Scattered cartilage loss about the wrist with large
areas of full-thickness cartilage loss in the radioscaphoid and
scaphotrapeziotrapezoid joints. Scattered small carpal bone
erosions. Additional small erosions of the second and third MCP
joints with synovial thickening. Large radiocarpal and midcarpal
joint effusions.Small distal radioulnar joint effusion.

Moderate to severe first CMC joint osteoarthritis. Moderate first
and third MCP joint osteoarthritis. Moderate to severe DIP joint
osteoarthritis.

Bones/carpal alignment: Chronic widening of the scapholunate
interval. Early proximal migration of the capitate.No suspicious
marrow signal abnormality. No acute fracture or dislocation.

Other: Severe soft tissue swelling of the wrist and hand. No fluid
collection.
IMPRESSION: 1. No acute abnormality.  No fracture or evidence of osteomyelitis.
2. Constellation of findings including severe flexor tenosynovitis,
scattered carpal bone and second and third MCP joint erosions, and
large radiocarpal midcarpal joint effusions, are most suggestive of
inflammatory arthropathy, potentially a mixed pattern of rheumatoid
arthritis and CPPD arthropathy.
3. Tears of the TFCC articular disc and foveal component of the
triangular ligament.
4. Early SLAC wrist.
5. Advanced osteoarthritis of the first CMC joint and DIP joints.
6. Severe soft tissue swelling of the wrist and hand. No fluid
collection.

## 2022-03-09 LAB — ANTIFUNGAL AST 9 DRUG PANEL
Amphotericin B MIC: 1
Flucytosine MIC: 1
Itraconazole MIC: 0.06
Posaconazole MIC: 0.03
